# Patient Record
Sex: Female | Born: 1950
Health system: Southern US, Community
[De-identification: ages and names within clinical notes are randomized; demographics above are authoritative.]

## PROBLEM LIST (undated history)

## (undated) DIAGNOSIS — G709 Myoneural disorder, unspecified: Secondary | ICD-10-CM

## (undated) DIAGNOSIS — N189 Chronic kidney disease, unspecified: Secondary | ICD-10-CM

## (undated) DIAGNOSIS — M5136 Other intervertebral disc degeneration, lumbar region: Secondary | ICD-10-CM

## (undated) DIAGNOSIS — T8859XA Other complications of anesthesia, initial encounter: Secondary | ICD-10-CM

## (undated) DIAGNOSIS — E785 Hyperlipidemia, unspecified: Secondary | ICD-10-CM

## (undated) DIAGNOSIS — Z789 Other specified health status: Secondary | ICD-10-CM

## (undated) DIAGNOSIS — M51369 Other intervertebral disc degeneration, lumbar region without mention of lumbar back pain or lower extremity pain: Secondary | ICD-10-CM

## (undated) DIAGNOSIS — T4145XA Adverse effect of unspecified anesthetic, initial encounter: Secondary | ICD-10-CM

## (undated) HISTORY — DX: Other intervertebral disc degeneration, lumbar region without mention of lumbar back pain or lower extremity pain: M51.369

## (undated) HISTORY — DX: Myoneural disorder, unspecified: G70.9

## (undated) HISTORY — DX: Hyperlipidemia, unspecified: E78.5

## (undated) HISTORY — DX: Chronic kidney disease, unspecified: N18.9

## (undated) HISTORY — DX: Other intervertebral disc degeneration, lumbar region: M51.36

---

## 1981-09-22 HISTORY — PX: FASCIAL DEFECT REPAIR: SHX865

## 1983-09-23 HISTORY — PX: NASAL SEPTUM SURGERY: SHX37

## 1998-05-01 ENCOUNTER — Ambulatory Visit (HOSPITAL_COMMUNITY): Admission: RE | Admit: 1998-05-01 | Discharge: 1998-05-01 | Payer: Self-pay | Admitting: Family Medicine

## 1999-05-07 ENCOUNTER — Encounter: Payer: Self-pay | Admitting: Family Medicine

## 1999-05-07 ENCOUNTER — Ambulatory Visit (HOSPITAL_COMMUNITY): Admission: RE | Admit: 1999-05-07 | Discharge: 1999-05-07 | Payer: Self-pay | Admitting: Family Medicine

## 1999-10-30 ENCOUNTER — Other Ambulatory Visit: Admission: RE | Admit: 1999-10-30 | Discharge: 1999-10-30 | Payer: Self-pay | Admitting: Family Medicine

## 2000-05-20 ENCOUNTER — Ambulatory Visit (HOSPITAL_COMMUNITY): Admission: RE | Admit: 2000-05-20 | Discharge: 2000-05-20 | Payer: Self-pay | Admitting: Family Medicine

## 2000-05-20 ENCOUNTER — Encounter: Payer: Self-pay | Admitting: Family Medicine

## 2000-11-09 ENCOUNTER — Other Ambulatory Visit: Admission: RE | Admit: 2000-11-09 | Discharge: 2000-11-09 | Payer: Self-pay | Admitting: Family Medicine

## 2001-05-28 ENCOUNTER — Encounter: Payer: Self-pay | Admitting: Family Medicine

## 2001-05-28 ENCOUNTER — Ambulatory Visit (HOSPITAL_COMMUNITY): Admission: RE | Admit: 2001-05-28 | Discharge: 2001-05-28 | Payer: Self-pay | Admitting: Family Medicine

## 2002-01-10 ENCOUNTER — Other Ambulatory Visit: Admission: RE | Admit: 2002-01-10 | Discharge: 2002-01-10 | Payer: Self-pay | Admitting: Family Medicine

## 2002-01-14 ENCOUNTER — Encounter: Payer: Self-pay | Admitting: Family Medicine

## 2002-01-14 ENCOUNTER — Encounter: Admission: RE | Admit: 2002-01-14 | Discharge: 2002-01-14 | Payer: Self-pay | Admitting: Family Medicine

## 2002-06-20 ENCOUNTER — Encounter: Payer: Self-pay | Admitting: Family Medicine

## 2002-06-20 ENCOUNTER — Ambulatory Visit (HOSPITAL_COMMUNITY): Admission: RE | Admit: 2002-06-20 | Discharge: 2002-06-20 | Payer: Self-pay | Admitting: Family Medicine

## 2003-03-03 ENCOUNTER — Other Ambulatory Visit: Admission: RE | Admit: 2003-03-03 | Discharge: 2003-03-03 | Payer: Self-pay | Admitting: Family Medicine

## 2003-07-19 ENCOUNTER — Ambulatory Visit (HOSPITAL_COMMUNITY): Admission: RE | Admit: 2003-07-19 | Discharge: 2003-07-19 | Payer: Self-pay | Admitting: Family Medicine

## 2004-03-06 ENCOUNTER — Other Ambulatory Visit: Admission: RE | Admit: 2004-03-06 | Discharge: 2004-03-06 | Payer: Self-pay | Admitting: Family Medicine

## 2004-08-01 ENCOUNTER — Ambulatory Visit (HOSPITAL_COMMUNITY): Admission: RE | Admit: 2004-08-01 | Discharge: 2004-08-01 | Payer: Self-pay | Admitting: Family Medicine

## 2005-04-04 ENCOUNTER — Ambulatory Visit: Payer: Self-pay | Admitting: Family Medicine

## 2005-06-23 ENCOUNTER — Other Ambulatory Visit: Admission: RE | Admit: 2005-06-23 | Discharge: 2005-06-23 | Payer: Self-pay | Admitting: Family Medicine

## 2005-06-23 ENCOUNTER — Ambulatory Visit: Payer: Self-pay | Admitting: Family Medicine

## 2005-07-04 ENCOUNTER — Ambulatory Visit: Payer: Self-pay | Admitting: Family Medicine

## 2005-08-11 ENCOUNTER — Ambulatory Visit (HOSPITAL_COMMUNITY): Admission: RE | Admit: 2005-08-11 | Discharge: 2005-08-11 | Payer: Self-pay | Admitting: Family Medicine

## 2006-05-18 ENCOUNTER — Ambulatory Visit: Payer: Self-pay | Admitting: Family Medicine

## 2006-06-11 ENCOUNTER — Ambulatory Visit: Payer: Self-pay | Admitting: Family Medicine

## 2006-07-03 ENCOUNTER — Encounter: Payer: Self-pay | Admitting: Family Medicine

## 2006-07-03 ENCOUNTER — Ambulatory Visit: Payer: Self-pay | Admitting: Family Medicine

## 2006-07-03 ENCOUNTER — Other Ambulatory Visit: Admission: RE | Admit: 2006-07-03 | Discharge: 2006-07-03 | Payer: Self-pay | Admitting: Family Medicine

## 2006-08-31 ENCOUNTER — Ambulatory Visit (HOSPITAL_COMMUNITY): Admission: RE | Admit: 2006-08-31 | Discharge: 2006-08-31 | Payer: Self-pay | Admitting: Family Medicine

## 2006-09-22 HISTORY — PX: COLONOSCOPY: SHX174

## 2007-07-01 ENCOUNTER — Ambulatory Visit: Payer: Self-pay | Admitting: Family Medicine

## 2007-07-01 LAB — CONVERTED CEMR LAB
Alkaline Phosphatase: 43 units/L (ref 39–117)
BUN: 16 mg/dL (ref 6–23)
Basophils Relative: 0.4 % (ref 0.0–1.0)
Bilirubin Urine: NEGATIVE
Bilirubin, Direct: 0.1 mg/dL (ref 0.0–0.3)
CO2: 31 meq/L (ref 19–32)
Cholesterol: 265 mg/dL (ref 0–200)
GFR calc Af Amer: 66 mL/min
Glucose, Bld: 92 mg/dL (ref 70–99)
Glucose, Urine, Semiquant: NEGATIVE
Hemoglobin: 14.3 g/dL (ref 12.0–15.0)
Lymphocytes Relative: 38.1 % (ref 12.0–46.0)
MCV: 92.6 fL (ref 78.0–100.0)
Monocytes Absolute: 0.4 10*3/uL (ref 0.2–0.7)
Monocytes Relative: 7.9 % (ref 3.0–11.0)
Neutro Abs: 2.4 10*3/uL (ref 1.4–7.7)
Potassium: 4.7 meq/L (ref 3.5–5.1)
Specific Gravity, Urine: 1.015
TSH: 2.66 microintl units/mL (ref 0.35–5.50)
Total Protein: 6.7 g/dL (ref 6.0–8.3)
VLDL: 9 mg/dL (ref 0–40)
pH: 7

## 2007-07-08 ENCOUNTER — Encounter: Payer: Self-pay | Admitting: Family Medicine

## 2007-07-08 ENCOUNTER — Other Ambulatory Visit: Admission: RE | Admit: 2007-07-08 | Discharge: 2007-07-08 | Payer: Self-pay | Admitting: Family Medicine

## 2007-07-08 ENCOUNTER — Ambulatory Visit: Payer: Self-pay | Admitting: Family Medicine

## 2007-07-08 DIAGNOSIS — E785 Hyperlipidemia, unspecified: Secondary | ICD-10-CM

## 2007-07-08 DIAGNOSIS — M818 Other osteoporosis without current pathological fracture: Secondary | ICD-10-CM | POA: Insufficient documentation

## 2007-07-16 ENCOUNTER — Ambulatory Visit: Payer: Self-pay | Admitting: Gastroenterology

## 2007-07-30 ENCOUNTER — Ambulatory Visit: Payer: Self-pay | Admitting: Gastroenterology

## 2007-07-30 ENCOUNTER — Encounter: Payer: Self-pay | Admitting: Gastroenterology

## 2007-07-30 ENCOUNTER — Encounter: Payer: Self-pay | Admitting: Family Medicine

## 2007-09-02 ENCOUNTER — Ambulatory Visit (HOSPITAL_COMMUNITY): Admission: RE | Admit: 2007-09-02 | Discharge: 2007-09-02 | Payer: Self-pay | Admitting: Family Medicine

## 2007-09-09 ENCOUNTER — Ambulatory Visit: Payer: Self-pay | Admitting: Internal Medicine

## 2008-09-21 ENCOUNTER — Ambulatory Visit: Payer: Self-pay | Admitting: Family Medicine

## 2008-09-21 LAB — CONVERTED CEMR LAB
Albumin: 4.4 g/dL (ref 3.5–5.2)
Alkaline Phosphatase: 42 units/L (ref 39–117)
BUN: 18 mg/dL (ref 6–23)
Bilirubin Urine: NEGATIVE
Calcium: 9.9 mg/dL (ref 8.4–10.5)
Cholesterol: 294 mg/dL (ref 0–200)
Eosinophils Absolute: 0.1 10*3/uL (ref 0.0–0.7)
Eosinophils Relative: 2.1 % (ref 0.0–5.0)
GFR calc Af Amer: 59 mL/min
GFR calc non Af Amer: 49 mL/min
HCT: 43.6 % (ref 36.0–46.0)
Ketones, urine, test strip: NEGATIVE
MCV: 96.4 fL (ref 78.0–100.0)
Monocytes Absolute: 0.3 10*3/uL (ref 0.1–1.0)
Platelets: 213 10*3/uL (ref 150–400)
Potassium: 4.9 meq/L (ref 3.5–5.1)
RDW: 12.2 % (ref 11.5–14.6)
Sodium: 147 meq/L — ABNORMAL HIGH (ref 135–145)
Specific Gravity, Urine: 1.015
TSH: 2.73 microintl units/mL (ref 0.35–5.50)
Triglycerides: 103 mg/dL (ref 0–149)
pH: 7.5

## 2008-09-28 ENCOUNTER — Ambulatory Visit (HOSPITAL_COMMUNITY): Admission: RE | Admit: 2008-09-28 | Discharge: 2008-09-28 | Payer: Self-pay | Admitting: Family Medicine

## 2008-09-29 ENCOUNTER — Encounter: Payer: Self-pay | Admitting: Family Medicine

## 2008-09-29 ENCOUNTER — Other Ambulatory Visit: Admission: RE | Admit: 2008-09-29 | Discharge: 2008-09-29 | Payer: Self-pay | Admitting: Family Medicine

## 2008-09-29 ENCOUNTER — Ambulatory Visit: Payer: Self-pay | Admitting: Family Medicine

## 2009-10-05 ENCOUNTER — Ambulatory Visit: Payer: Self-pay | Admitting: Family Medicine

## 2009-10-05 LAB — CONVERTED CEMR LAB
ALT: 15 units/L (ref 0–35)
ALT: 25 units/L (ref 0–35)
AST: 21 units/L (ref 0–37)
AST: 32 units/L (ref 0–37)
Albumin: 4.6 g/dL (ref 3.5–5.2)
Albumin: 4.9 g/dL (ref 3.5–5.2)
Alkaline Phosphatase: 32 units/L — ABNORMAL LOW (ref 39–117)
Alkaline Phosphatase: 57 units/L (ref 39–117)
Basophils Absolute: 0 10*3/uL (ref 0.0–0.1)
Bilirubin Urine: NEGATIVE
CO2: 19 meq/L (ref 19–32)
Calcium: 10.2 mg/dL (ref 8.4–10.5)
Calcium: 9 mg/dL (ref 8.4–10.5)
Cholesterol: 129 mg/dL (ref 0–200)
Cholesterol: 316 mg/dL — ABNORMAL HIGH (ref 0–200)
Creatinine, Ser: 0.98 mg/dL (ref 0.40–1.20)
Eosinophils Relative: 1.5 % (ref 0.0–5.0)
Glucose, Urine, Semiquant: NEGATIVE
HCT: 45.5 % (ref 36.0–46.0)
HDL: 123 mg/dL (ref >39)
HDL: 62 mg/dL (ref >39)
Lymphs Abs: 1.2 10*3/uL (ref 0.7–4.0)
MCV: 97.4 fL (ref 78.0–100.0)
Monocytes Absolute: 0.3 10*3/uL (ref 0.1–1.0)
Neutrophils Relative %: 68.7 % (ref 43.0–77.0)
Platelets: 221 10*3/uL (ref 150.0–400.0)
RDW: 12.8 % (ref 11.5–14.6)
Sodium: 141 meq/L (ref 135–145)
Sodium: 143 meq/L (ref 135–145)
Specific Gravity, Urine: 1.015
TSH: 1.45 microintl units/mL (ref 0.35–5.50)
Total CHOL/HDL Ratio: 2.1
Total CHOL/HDL Ratio: 2.6
Total Protein: 7 g/dL (ref 6.0–8.3)
Triglycerides: 43 mg/dL (ref <150)
WBC: 5.1 10*3/uL (ref 4.5–10.5)
pH: 6.5

## 2009-10-16 ENCOUNTER — Other Ambulatory Visit: Admission: RE | Admit: 2009-10-16 | Discharge: 2009-10-16 | Payer: Self-pay | Admitting: Family Medicine

## 2009-10-16 ENCOUNTER — Ambulatory Visit: Payer: Self-pay | Admitting: Family Medicine

## 2009-10-16 LAB — CONVERTED CEMR LAB: Pap Smear: NEGATIVE

## 2009-10-19 LAB — CONVERTED CEMR LAB
ALT: 25 units/L (ref 0–35)
BUN: 13 mg/dL (ref 6–23)
Basophils Absolute: 0 10*3/uL (ref 0.0–0.1)
Bilirubin, Direct: 0.1 mg/dL (ref 0.0–0.3)
CO2: 30 meq/L (ref 19–32)
Chloride: 103 meq/L (ref 96–112)
Cholesterol: 255 mg/dL — ABNORMAL HIGH (ref 0–200)
Creatinine, Ser: 1.1 mg/dL (ref 0.4–1.2)
Direct LDL: 130.7 mg/dL
Eosinophils Absolute: 0.1 10*3/uL (ref 0.0–0.7)
Eosinophils Relative: 1.4 % (ref 0.0–5.0)
Glucose, Bld: 84 mg/dL (ref 70–99)
HCT: 39.8 % (ref 36.0–46.0)
Lymphs Abs: 1.4 10*3/uL (ref 0.7–4.0)
MCHC: 33.4 g/dL (ref 30.0–36.0)
MCV: 96.3 fL (ref 78.0–100.0)
Monocytes Absolute: 0.2 10*3/uL (ref 0.1–1.0)
Neutrophils Relative %: 64 % (ref 43.0–77.0)
Platelets: 206 10*3/uL (ref 150.0–400.0)
Potassium: 3.9 meq/L (ref 3.5–5.1)
RDW: 12.3 % (ref 11.5–14.6)
TSH: 1.57 microintl units/mL (ref 0.35–5.50)
Total Bilirubin: 0.9 mg/dL (ref 0.3–1.2)
VLDL: 14.8 mg/dL (ref 0.0–40.0)

## 2009-11-20 ENCOUNTER — Ambulatory Visit (HOSPITAL_COMMUNITY): Admission: RE | Admit: 2009-11-20 | Discharge: 2009-11-20 | Payer: Self-pay | Admitting: Family Medicine

## 2010-10-24 NOTE — Assessment & Plan Note (Signed)
Summary: cpx/cjr   Vital Signs:  Patient profile:   60 year old female Menstrual status:  postmenopausal Height:      60 inches Weight:      105 pounds BMI:     20.58 Temp:     97.3 degrees F oral BP sitting:   124 / 78  (left arm) Cuff size:   regular  Vitals Entered By: Kern Reap CMA Duncan Dull) (October 16, 2009 1:48 PM)  Reason for Visit cpx  History of Present Illness: william is a 60 year old, single female, nonsmoker, who comes in today for physical examination,  She's had a history of some osteoporosis.  She was on Fosamax, but withheld the because of the negative issues with Fosamax.  She takes calcium, vitamin D, and walks on a regular basis.  She was using Premarin vaginal cream for vaginal dryness stopped it because it costs.  She has a history of hyperlipidemia, but she treats with diet and exercise.  She gets routine eye care, be 6 months, where her dental care, does BSE monthly.  Does not want to get a mammogram because the $250 charge.  Advised to get a mammogram because of her history of fibrocystic disease.  It makes it extremely difficult to, tell what's going on in her breasts.  She colonoscopy 2009 which showed some polyps.  She is on a recall list for 5 years.  Tetanus 2010 seasonal flu shot today  Allergies: No Known Drug Allergies  Past History:  Past medical, surgical, family and social histories (including risk factors) reviewed, and no changes noted (except as noted below).  Past Medical History: Reviewed history from 04/08/2007 and no changes required. OP Elevated Cholesterol  Past Surgical History: Reviewed history from 09/29/2008 and no changes required. Denies surgical history  Family History: Reviewed history from 09/29/2008 and no changes required. mother and father still living.  No chronic problems  Social History: Reviewed history from 07/08/2007 and no changes required. Single Domestic Partner Never Smoked Alcohol  use-yes Drug use-no Regular exercise-yes  Review of Systems      See HPI       Flu Vaccine Consent Questions     Do you have a history of severe allergic reactions to this vaccine? no    Any prior history of allergic reactions to egg and/or gelatin? no    Do you have a sensitivity to the preservative Thimersol? no    Do you have a past history of Guillan-Barre Syndrome? no    Do you currently have an acute febrile illness? no    Have you ever had a severe reaction to latex? no    Vaccine information given and explained to patient? yes    Are you currently pregnant? no    Lot Number:[id number]   Exp Date:03/21/2010   Site Given  right  Deltoid IM   Physical Exam  General:  Well-developed,well-nourished,in no acute distress; alert,appropriate and cooperative throughout examination Head:  Normocephalic and atraumatic without obvious abnormalities. No apparent alopecia or balding. Eyes:  No corneal or conjunctival inflammation noted. EOMI. Perrla. Funduscopic exam benign, without hemorrhages, exudates or papilledema. Vision grossly normal. Ears:  External ear exam shows no significant lesions or deformities.  Otoscopic examination reveals clear canals, tympanic membranes are intact bilaterally without bulging, retraction, inflammation or discharge. Hearing is grossly normal bilaterally. Nose:  External nasal examination shows no deformity or inflammation. Nasal mucosa are pink and moist without lesions or exudates. Mouth:  Oral mucosa and oropharynx without lesions or  exudates.  Teeth in good repair. Neck:  No deformities, masses, or tenderness noted. Chest Wall:  No deformities, masses, or tenderness noted. Breasts:  the breasts are symmetrical.  Diffuse fibrocystic changes throughout breasts breasts, more prominent on the left than the right. Lungs:  Normal respiratory effort, chest expands symmetrically. Lungs are clear to auscultation, no crackles or wheezes. Heart:  Normal rate and  regular rhythm. S1 and S2 normal without gallop, murmur, click, rub or other extra sounds. Abdomen:  Bowel sounds positive,abdomen soft and non-tender without masses, organomegaly or hernias noted. Rectal:  No external abnormalities noted. Normal sphincter tone. No rectal masses or tenderness. Genitalia:  Pelvic Exam:        External: normal female genitalia without lesions or masses        Vagina: normal without lesions or masses        Cervix: normal without lesions or masses        Adnexa: normal bimanual exam without masses or fullness        Uterus: normal by palpation        Pap smear: performed Msk:  No deformity or scoliosis noted of thoracic or lumbar spine.   Pulses:  R and L carotid,radial,femoral,dorsalis pedis and posterior tibial pulses are full and equal bilaterally Extremities:  No clubbing, cyanosis, edema, or deformity noted with normal full range of motion of all joints.   Neurologic:  No cranial nerve deficits noted. Station and gait are normal. Plantar reflexes are down-going bilaterally. DTRs are symmetrical throughout. Sensory, motor and coordinative functions appear intact. Skin:  Intact without suspicious lesions or rashes Cervical Nodes:  No lymphadenopathy noted Axillary Nodes:  No palpable lymphadenopathy Inguinal Nodes:  No significant adenopathy Psych:  Cognition and judgment appear intact. Alert and cooperative with normal attention span and concentration. No apparent delusions, illusions, hallucinations   Impression & Recommendations:  Problem # 1:  HYPERLIPIDEMIA NEC/NOS (ICD-272.4) Assessment Unchanged  Orders: TLB-Lipid Panel (80061-LIPID) TLB-BMP (Basic Metabolic Panel-BMET) (80048-METABOL) TLB-CBC Platelet - w/Differential (85025-CBCD) TLB-Hepatic/Liver Function Pnl (80076-HEPATIC) TLB-TSH (Thyroid Stimulating Hormone) (84443-TSH) No Charge Patient Arrived (NCPA0) (NCPA0) Prescription Created Electronically [phone])  Problem # 2:  OSTEOPOROSIS,  IDIOPATHIC (ICD-733.02) Assessment: Unchanged  The following medications were removed from the medication list:    Fosamax 70 Mg Tabs (Alendronate sodium) .Marland Kitchen... 1/2 tab week  Orders: TLB-Lipid Panel (80061-LIPID) TLB-BMP (Basic Metabolic Panel-BMET) (80048-METABOL) TLB-CBC Platelet - w/Differential (85025-CBCD) TLB-Hepatic/Liver Function Pnl (80076-HEPATIC) TLB-TSH (Thyroid Stimulating Hormone) (84443-TSH) No Charge Patient Arrived (NCPA0) (NCPA0) Prescription Created Electronically [phone])  Problem # 3:  HEALTH SCREENING (ICD-V70.0) Assessment: Unchanged  Orders: TLB-Lipid Panel (80061-LIPID) TLB-BMP (Basic Metabolic Panel-BMET) (80048-METABOL) TLB-CBC Platelet - w/Differential (85025-CBCD) TLB-Hepatic/Liver Function Pnl (80076-HEPATIC) TLB-TSH (Thyroid Stimulating Hormone) (84443-TSH) No Charge Patient Arrived (NCPA0) (NCPA0) Prescription Created Electronically [id number])  Complete Medication List: 1)  Omega 3 340 Mg Cpdr (Omega-3 fatty acids) .... Take one tab once daily 2)  Daily Multi Tabs (Multiple vitamins-minerals) .... Once daily 3)  Caltrate 600+d Plus 600-400 Mg-unit Tabs (Calcium carbonate-vit d-min) .... Once daily 4)  Aspirin 81 Mg Tbec (Aspirin) .... Once daily  Other Orders: Admin 1st Vaccine (08657) Flu Vaccine 22yrs + [phone])  Patient Instructions: 1)  I will call you when I get the lab issue resolved. 2)  Please schedule a follow-up appointment in 1 year. 3)  Schedule your mammogram. 4)  Take calcium +Vitamin D daily. 5)  Take an Aspirin every day.

## 2013-10-25 ENCOUNTER — Telehealth: Payer: Self-pay | Admitting: Family Medicine

## 2013-10-25 NOTE — Telephone Encounter (Signed)
Pt would like to re-est w/md. Kerin SalenGolf buddy

## 2013-10-26 NOTE — Telephone Encounter (Signed)
Okay per Dr Todd 

## 2013-10-27 NOTE — Telephone Encounter (Signed)
Pt has been sch

## 2013-12-20 ENCOUNTER — Ambulatory Visit: Payer: Self-pay | Admitting: Family Medicine

## 2013-12-26 ENCOUNTER — Ambulatory Visit (INDEPENDENT_AMBULATORY_CARE_PROVIDER_SITE_OTHER): Payer: Medicare Other | Admitting: Family Medicine

## 2013-12-26 ENCOUNTER — Other Ambulatory Visit: Payer: Self-pay

## 2013-12-26 ENCOUNTER — Other Ambulatory Visit (HOSPITAL_COMMUNITY)
Admission: RE | Admit: 2013-12-26 | Discharge: 2013-12-26 | Disposition: A | Discharge status: Home / Self Care | Payer: Medicare Other | Source: Ambulatory Visit | Attending: Family Medicine | Admitting: Family Medicine

## 2013-12-26 ENCOUNTER — Encounter: Payer: Self-pay | Admitting: Family Medicine

## 2013-12-26 VITALS — BP 120/80 | Temp 97.7°F | Ht 60.0 in | Wt 102.0 lb

## 2013-12-26 DIAGNOSIS — Z124 Encounter for screening for malignant neoplasm of cervix: Secondary | ICD-10-CM | POA: Diagnosis not present

## 2013-12-26 DIAGNOSIS — Z0000 Encounter for general adult medical examination without abnormal findings: Secondary | ICD-10-CM | POA: Diagnosis not present

## 2013-12-26 DIAGNOSIS — Z23 Encounter for immunization: Secondary | ICD-10-CM | POA: Diagnosis not present

## 2013-12-26 DIAGNOSIS — Z1231 Encounter for screening mammogram for malignant neoplasm of breast: Secondary | ICD-10-CM

## 2013-12-26 LAB — POCT URINALYSIS DIPSTICK
Bilirubin, UA: NEGATIVE
Glucose, UA: NEGATIVE
Leukocytes, UA: NEGATIVE
Nitrite, UA: NEGATIVE
PH UA: 5.5
RBC UA: NEGATIVE
SPEC GRAV UA: 1.025
Urobilinogen, UA: 0.2

## 2013-12-26 LAB — CBC WITH DIFFERENTIAL/PLATELET
BASOS PCT: 0.4 % (ref 0.0–3.0)
Basophils Absolute: 0 10*3/uL (ref 0.0–0.1)
EOS PCT: 1.3 % (ref 0.0–5.0)
Eosinophils Absolute: 0.1 10*3/uL (ref 0.0–0.7)
HEMATOCRIT: 42.3 % (ref 36.0–46.0)
Hemoglobin: 14.1 g/dL (ref 12.0–15.0)
LYMPHS ABS: 1.5 10*3/uL (ref 0.7–4.0)
Lymphocytes Relative: 30.6 % (ref 12.0–46.0)
MCHC: 33.3 g/dL (ref 30.0–36.0)
MCV: 96.7 fl (ref 78.0–100.0)
MONO ABS: 0.3 10*3/uL (ref 0.1–1.0)
Monocytes Relative: 6.6 % (ref 3.0–12.0)
Neutro Abs: 3 10*3/uL (ref 1.4–7.7)
Neutrophils Relative %: 61.1 % (ref 43.0–77.0)
Platelets: 219 10*3/uL (ref 150.0–400.0)
RBC: 4.37 Mil/uL (ref 3.87–5.11)
RDW: 13.2 % (ref 11.5–14.6)
WBC: 5 10*3/uL (ref 4.5–10.5)

## 2013-12-26 LAB — BASIC METABOLIC PANEL
BUN: 17 mg/dL (ref 6–23)
CHLORIDE: 104 meq/L (ref 96–112)
CO2: 29 meq/L (ref 19–32)
CREATININE: 1.1 mg/dL (ref 0.4–1.2)
Calcium: 10 mg/dL (ref 8.4–10.5)
GFR: 55.27 mL/min — ABNORMAL LOW (ref >60.00)
GLUCOSE: 86 mg/dL (ref 70–99)
Potassium: 4.7 mEq/L (ref 3.5–5.1)
Sodium: 143 mEq/L (ref 135–145)

## 2013-12-26 LAB — TSH: TSH: 0.83 u[IU]/mL (ref 0.35–5.50)

## 2013-12-26 NOTE — Progress Notes (Signed)
   Subjective:    Patient ID: Jennifer Lutz, female    DOB: [birth year], 63 y.o.   MRN: [id number]  HPI Jennifer Lutz is a 63 year old single female G0 P0 nonsmoker who comes in today for a Medicare wellness examination  We last saw her 4 years ago and a physical exam at that point everything was well.  There's been no change in her medical history since that time  She's hospitalized once when she was 20 for abdominal pain they saw that the right kidney was also on the left of the take it out to the left alone it's been functioning well since that time. She had a rhinoplasty and a laceration repaired her left lateral thigh as an outpatient. We'll no medicine allergies. Does not smoke or drink occasional drink of alcohol and takes no prescription medicines  Partner review of systems she gets annual eye care, dental care, colonoscopy 2011 showed some polyps, LMP when she was 63 8 years ago. Last Pap 4 years ago, BSE monthly, last mammogram 2012  Father died at 65 after a fall mother still alive 65 a nursing home one brother in good health tetanus booster do  She gets routine eye care, dental care, BSE monthly, mammography as outlined colonoscopy as above  Cognitive function normal she exercises regularly and plays golf 4-5 days per week, home health safety reviewed no issues identified, no guns in the house, she does have a health care power of attorney and living will    Review of Systems  Constitutional: Negative.   HENT: Negative.   Eyes: Negative.   Respiratory: Negative.   Cardiovascular: Negative.   Gastrointestinal: Negative.   Genitourinary: Negative.   Musculoskeletal: Negative.   Neurological: Negative.   Psychiatric/Behavioral: Negative.        Objective:   Physical Exam  Constitutional: She appears well-developed and well-nourished.  HENT:  Head: Normocephalic and atraumatic.  Right Ear: External ear normal.  Left Ear: External ear normal.  Nose: Nose normal.    Mouth/Throat: Oropharynx is clear and moist.  Eyes: EOM are normal. Pupils are equal, round, and reactive to light.  Neck: Normal range of motion. Neck supple. No thyromegaly present.  Cardiovascular: Normal rate, regular rhythm, normal heart sounds and intact distal pulses.  Exam reveals no gallop and no friction rub.   No murmur heard. Pulmonary/Chest: Effort normal and breath sounds normal.  Abdominal: Soft. Bowel sounds are normal. She exhibits no distension and no mass. There is no tenderness. There is no rebound.  Genitourinary: Vagina normal and uterus normal. Guaiac negative stool. No vaginal discharge found.  Bilateral breast exam normal however she does have dense breast tissue. Recommend 3-D mammography  Musculoskeletal: Normal range of motion.  Lymphadenopathy:    She has no cervical adenopathy.  Neurological: She is alert. She has normal reflexes. No cranial nerve deficit. She exhibits normal muscle tone. Coordination normal.  Skin: Skin is warm and dry. No rash noted. No erythema. No pallor.  Total body skin exam normal  Psychiatric: She has a normal mood and affect. Her behavior is normal. Judgment and thought content normal.          Assessment & Plan:  Healthy female return every 3 years for Pap smear. Yearly for general Medicare wellness exam

## 2013-12-26 NOTE — Progress Notes (Signed)
Pre visit review using our clinic review tool, if applicable. No additional management support is needed unless otherwise documented below in the visit note. 

## 2013-12-26 NOTE — Patient Instructions (Signed)
Continue your good health habits  Return yearly for general physical examination

## 2014-01-27 ENCOUNTER — Ambulatory Visit
Admission: RE | Admit: 2014-01-27 | Discharge: 2014-01-27 | Disposition: A | Discharge status: Home / Self Care | Payer: Medicare Other | Source: Ambulatory Visit

## 2014-01-27 DIAGNOSIS — Z1231 Encounter for screening mammogram for malignant neoplasm of breast: Secondary | ICD-10-CM

## 2014-06-30 ENCOUNTER — Telehealth: Payer: Self-pay | Admitting: Family Medicine

## 2014-06-30 NOTE — Telephone Encounter (Signed)
Patient Information:  Caller Name: Edwyna ShellHart  Phone: [phone]  Patient: Jennifer Lutz, Hart  Gender: Female  DOB: 03-Oct-1950  Age: [age] Years  PCP: Kelle Dartingodd, [facility](Family Practice)  Office Follow Up:  Does the office need to follow up with this patient?: Yes  Instructions For The Office: Plan of care  RN Note:  Patient is calling regarding headache which has been persistent for 1 week since the recent loss of her mother who passed away on 06/23/14 after complications of a CVA.  Denies visual changes, numbness, or dizziness.  Describes as mild to moderate more on right side.  B/P 130/70.  Offered appointment patient declines.  Symptoms  Reason For Call & Symptoms: headache  Reviewed Health History In EMR: Yes  Reviewed Medications In EMR: Yes  Reviewed Allergies In EMR: Yes  Reviewed Surgeries / Procedures: Yes  Date of Onset of Symptoms: 06/23/2014  Guideline(s) Used:  Headache  Disposition Per Guideline:   See Today or Tomorrow in Office  Reason For Disposition Reached:   New headache and age > 6565  Advice Given:  N/A  Patient Refused Recommendation:  Patient Refused Care Advice  Patient states she is unable to come for visit.

## 2014-12-25 ENCOUNTER — Other Ambulatory Visit: Payer: Self-pay | Admitting: Family Medicine

## 2014-12-25 DIAGNOSIS — Z1231 Encounter for screening mammogram for malignant neoplasm of breast: Secondary | ICD-10-CM

## 2015-01-11 ENCOUNTER — Telehealth: Payer: Self-pay | Admitting: Family Medicine

## 2015-01-11 NOTE — Telephone Encounter (Signed)
Pt last cpx 12-2013. Pt would like to be work in for cpx before sept

## 2015-01-12 NOTE — Telephone Encounter (Signed)
Okay to work in per Dr Todd 

## 2015-01-12 NOTE — Telephone Encounter (Signed)
Pt has been sch

## 2015-01-29 ENCOUNTER — Ambulatory Visit (HOSPITAL_COMMUNITY)
Admission: RE | Admit: 2015-01-29 | Discharge: 2015-01-29 | Disposition: A | Discharge status: Home / Self Care | Payer: Medicare Other | Source: Ambulatory Visit | Attending: Family Medicine | Admitting: Family Medicine

## 2015-01-29 DIAGNOSIS — Z1231 Encounter for screening mammogram for malignant neoplasm of breast: Secondary | ICD-10-CM | POA: Diagnosis not present

## 2015-01-30 ENCOUNTER — Ambulatory Visit (INDEPENDENT_AMBULATORY_CARE_PROVIDER_SITE_OTHER): Payer: Medicare Other | Admitting: Family Medicine

## 2015-01-30 ENCOUNTER — Encounter: Payer: Self-pay | Admitting: Family Medicine

## 2015-01-30 VITALS — BP 120/80 | Temp 97.9°F | Ht 59.75 in | Wt 101.0 lb

## 2015-01-30 DIAGNOSIS — Z23 Encounter for immunization: Secondary | ICD-10-CM | POA: Diagnosis not present

## 2015-01-30 DIAGNOSIS — Z0000 Encounter for general adult medical examination without abnormal findings: Secondary | ICD-10-CM | POA: Diagnosis not present

## 2015-01-30 DIAGNOSIS — M818 Other osteoporosis without current pathological fracture: Secondary | ICD-10-CM

## 2015-01-30 DIAGNOSIS — E785 Hyperlipidemia, unspecified: Secondary | ICD-10-CM | POA: Diagnosis not present

## 2015-01-30 LAB — BASIC METABOLIC PANEL
BUN: 17 mg/dL (ref 6–23)
CALCIUM: 9.7 mg/dL (ref 8.4–10.5)
CO2: 29 mEq/L (ref 19–32)
Chloride: 103 mEq/L (ref 96–112)
Creatinine, Ser: 0.99 mg/dL (ref 0.40–1.20)
GFR: 59.6 mL/min — ABNORMAL LOW (ref >60.00)
GLUCOSE: 94 mg/dL (ref 70–99)
POTASSIUM: 4.3 meq/L (ref 3.5–5.1)
Sodium: 139 mEq/L (ref 135–145)

## 2015-01-30 LAB — CBC WITH DIFFERENTIAL/PLATELET
BASOS PCT: 0.5 % (ref 0.0–3.0)
Basophils Absolute: 0 10*3/uL (ref 0.0–0.1)
Eosinophils Absolute: 0.1 10*3/uL (ref 0.0–0.7)
Eosinophils Relative: 1.3 % (ref 0.0–5.0)
HCT: 40.1 % (ref 36.0–46.0)
Hemoglobin: 13.9 g/dL (ref 12.0–15.0)
LYMPHS PCT: 28.3 % (ref 12.0–46.0)
Lymphs Abs: 1.2 10*3/uL (ref 0.7–4.0)
MCHC: 34.7 g/dL (ref 30.0–36.0)
MCV: 92.9 fl (ref 78.0–100.0)
Monocytes Absolute: 0.2 10*3/uL (ref 0.1–1.0)
Monocytes Relative: 5.9 % (ref 3.0–12.0)
NEUTROS PCT: 64 % (ref 43.0–77.0)
Neutro Abs: 2.7 10*3/uL (ref 1.4–7.7)
Platelets: 209 10*3/uL (ref 150.0–400.0)
RBC: 4.32 Mil/uL (ref 3.87–5.11)
RDW: 13.3 % (ref 11.5–15.5)
WBC: 4.2 10*3/uL (ref 4.0–10.5)

## 2015-01-30 LAB — POCT URINALYSIS DIPSTICK
BILIRUBIN UA: NEGATIVE
Glucose, UA: NEGATIVE
KETONES UA: NEGATIVE
Leukocytes, UA: NEGATIVE
Nitrite, UA: NEGATIVE
PROTEIN UA: NEGATIVE
RBC UA: NEGATIVE
Spec Grav, UA: 1.02
Urobilinogen, UA: 0.2
pH, UA: 6

## 2015-01-30 LAB — TSH: TSH: 1.53 u[IU]/mL (ref 0.35–4.50)

## 2015-01-30 MED ORDER — ZOSTER VACCINE LIVE 19400 UNT/0.65ML ~~LOC~~ SOLR: 0.6500 mL | Freq: Once | SUBCUTANEOUS | Status: DC

## 2015-01-30 NOTE — Progress Notes (Signed)
Pre visit review using our clinic review tool, if applicable. No additional management support is needed unless otherwise documented below in the visit note. 

## 2015-01-30 NOTE — Progress Notes (Signed)
   Subjective:    Patient ID: Jennifer Lutz, female    DOB: 1951-02-25, 64 y.o.   MRN: [id number]  HPI  Jennifer Lutz is a 64 year old single female nonsmoker who comes in today for general physical examination  She's always been next health she has no chronic health problems and takes no medication on a regular basis for anything.  She gets routine eye care, dental care, BSE monthly, mammogram yesterday because of dense breast a 3-D was done which was normal, colonoscopy every 10 years normal last 1 2010.  She takes calcium vitamin D and aspirin tablet daily. She walks 18 holes of golf 3-4 days a week.  Cognitive function normal she walks daily home health safety reviewed no issues identified, no guns in the house, she does have a healthcare power of attorney and living well  She has a history of some low back pain. She describes as central L5-S1 area. Does not radiate is not sharp. She's a little stiff in the morning. She's tried some Motrin with some fairly good relief. Sitting makes it worse. No history of spinal trauma.  She has a history of some osteopenia we'll check bone density  Review of Systems  Constitutional: Negative.   HENT: Negative.   Eyes: Negative.   Respiratory: Negative.   Cardiovascular: Negative.   Gastrointestinal: Negative.   Endocrine: Negative.   Genitourinary: Negative.   Musculoskeletal: Negative.   Skin: Negative.   Allergic/Immunologic: Negative.   Neurological: Negative.   Hematological: Negative.   Psychiatric/Behavioral: Negative.        Objective:   Physical Exam  Constitutional: She appears well-developed and well-nourished.  HENT:  Head: Normocephalic and atraumatic.  Right Ear: External ear normal.  Left Ear: External ear normal.  Nose: Nose normal.  Mouth/Throat: Oropharynx is clear and moist.  Eyes: EOM are normal. Pupils are equal, round, and reactive to light.  Neck: Normal range of motion. Neck supple. No JVD present. No  tracheal deviation present. No thyromegaly present.  Cardiovascular: Normal rate, regular rhythm, normal heart sounds and intact distal pulses.  Exam reveals no gallop and no friction rub.   No murmur heard. Pulmonary/Chest: Effort normal and breath sounds normal. No stridor. No respiratory distress. She has no wheezes. She has no rales. She exhibits no tenderness.  Abdominal: Soft. Bowel sounds are normal. She exhibits no distension and no mass. There is no tenderness. There is no rebound and no guarding.  Genitourinary:  Bilateral breast exam normal  Pelvic and Pap deferred. Last year both were normal. No history of any GYN problems therefore every 3 years on her Paps  Musculoskeletal: Normal range of motion.  Lymphadenopathy:    She has no cervical adenopathy.  Neurological: She is alert. She has normal reflexes. No cranial nerve deficit. She exhibits normal muscle tone. Coordination normal.  Skin: Skin is warm and dry. No rash noted. No erythema. No pallor.  Total body skin exam normal  Psychiatric: She has a normal mood and affect. Her behavior is normal. Judgment and thought content normal.  Nursing note and vitals reviewed.         Assessment & Plan:  Healthy female  History of osteo-pia,,,,,,,,,, recheck bone density  Osteoarthritis,,,,,,,,,,,, spine, spine Motrin 400 twice a day and exercise. ,,,,,,,,,,,,,,,,,,,,,,,,,,

## 2015-01-30 NOTE — Patient Instructions (Signed)
Continue exercise and good health habits  Follow-up in one year sooner if any problems  Rachel's extension is [phone]  [employer] Energy[employer]ory nafzinger.........Marland Kitchen. our new adult nurse practitioner  Motrin 400 mg twice daily with food,,,,,,,, avoid sitting,,,,, continue exercise  Follow-up bone density

## 2015-02-02 ENCOUNTER — Ambulatory Visit (INDEPENDENT_AMBULATORY_CARE_PROVIDER_SITE_OTHER)
Admission: RE | Admit: 2015-02-02 | Discharge: 2015-02-02 | Disposition: A | Discharge status: Home / Self Care | Payer: Medicare Other | Source: Ambulatory Visit | Attending: Family Medicine | Admitting: Family Medicine

## 2015-02-02 DIAGNOSIS — M818 Other osteoporosis without current pathological fracture: Secondary | ICD-10-CM

## 2015-02-02 DIAGNOSIS — Z23 Encounter for immunization: Secondary | ICD-10-CM

## 2015-02-02 DIAGNOSIS — Z0000 Encounter for general adult medical examination without abnormal findings: Secondary | ICD-10-CM

## 2015-04-11 DIAGNOSIS — M24131 Other articular cartilage disorders, right wrist: Secondary | ICD-10-CM | POA: Diagnosis not present

## 2015-04-11 DIAGNOSIS — M24132 Other articular cartilage disorders, left wrist: Secondary | ICD-10-CM | POA: Diagnosis not present

## 2015-05-07 DIAGNOSIS — M24132 Other articular cartilage disorders, left wrist: Secondary | ICD-10-CM | POA: Diagnosis not present

## 2015-05-10 ENCOUNTER — Other Ambulatory Visit: Payer: Self-pay | Admitting: Orthopedic Surgery

## 2015-05-10 DIAGNOSIS — M19012 Primary osteoarthritis, left shoulder: Secondary | ICD-10-CM | POA: Diagnosis not present

## 2015-05-10 DIAGNOSIS — M25832 Other specified joint disorders, left wrist: Secondary | ICD-10-CM

## 2015-05-10 DIAGNOSIS — M25512 Pain in left shoulder: Secondary | ICD-10-CM | POA: Diagnosis not present

## 2015-05-10 DIAGNOSIS — M25532 Pain in left wrist: Secondary | ICD-10-CM

## 2015-05-21 ENCOUNTER — Ambulatory Visit
Admission: RE | Admit: 2015-05-21 | Discharge: 2015-05-21 | Disposition: A | Discharge status: Home / Self Care | Payer: Medicare Other | Source: Ambulatory Visit | Attending: Orthopedic Surgery | Admitting: Orthopedic Surgery

## 2015-05-21 DIAGNOSIS — M25532 Pain in left wrist: Secondary | ICD-10-CM

## 2015-05-21 DIAGNOSIS — M25832 Other specified joint disorders, left wrist: Secondary | ICD-10-CM

## 2015-05-21 DIAGNOSIS — S63592A Other specified sprain of left wrist, initial encounter: Secondary | ICD-10-CM | POA: Diagnosis not present

## 2015-05-21 MED ORDER — IOHEXOL 180 MG/ML  SOLN: 1.5000 mL | Freq: Once | INTRAMUSCULAR | Status: DC | PRN

## 2015-05-30 DIAGNOSIS — M24232 Disorder of ligament, left wrist: Secondary | ICD-10-CM | POA: Diagnosis not present

## 2015-05-31 ENCOUNTER — Encounter (HOSPITAL_BASED_OUTPATIENT_CLINIC_OR_DEPARTMENT_OTHER): Payer: Self-pay | Admitting: *Deleted

## 2015-05-31 ENCOUNTER — Other Ambulatory Visit: Payer: Self-pay | Admitting: Orthopedic Surgery

## 2015-06-07 ENCOUNTER — Ambulatory Visit (HOSPITAL_BASED_OUTPATIENT_CLINIC_OR_DEPARTMENT_OTHER): Payer: Medicare Other | Admitting: Anesthesiology

## 2015-06-07 ENCOUNTER — Encounter (HOSPITAL_BASED_OUTPATIENT_CLINIC_OR_DEPARTMENT_OTHER): Payer: Self-pay | Admitting: Anesthesiology

## 2015-06-07 ENCOUNTER — Ambulatory Visit (HOSPITAL_BASED_OUTPATIENT_CLINIC_OR_DEPARTMENT_OTHER)
Admission: RE | Admit: 2015-06-07 | Discharge: 2015-06-07 | Disposition: A | Discharge status: Home / Self Care | Payer: Medicare Other | Source: Ambulatory Visit | Attending: Orthopedic Surgery | Admitting: Orthopedic Surgery

## 2015-06-07 ENCOUNTER — Encounter (HOSPITAL_BASED_OUTPATIENT_CLINIC_OR_DEPARTMENT_OTHER)
Admission: RE | Disposition: A | Discharge status: Home / Self Care | Payer: Self-pay | Source: Ambulatory Visit | Attending: Orthopedic Surgery

## 2015-06-07 DIAGNOSIS — M24132 Other articular cartilage disorders, left wrist: Secondary | ICD-10-CM | POA: Diagnosis not present

## 2015-06-07 DIAGNOSIS — X58XXXA Exposure to other specified factors, initial encounter: Secondary | ICD-10-CM | POA: Diagnosis not present

## 2015-06-07 DIAGNOSIS — M24232 Disorder of ligament, left wrist: Secondary | ICD-10-CM | POA: Diagnosis not present

## 2015-06-07 DIAGNOSIS — S63512A Sprain of carpal joint of left wrist, initial encounter: Secondary | ICD-10-CM | POA: Diagnosis not present

## 2015-06-07 DIAGNOSIS — M25532 Pain in left wrist: Secondary | ICD-10-CM | POA: Diagnosis not present

## 2015-06-07 DIAGNOSIS — M24832 Other specific joint derangements of left wrist, not elsewhere classified: Secondary | ICD-10-CM | POA: Diagnosis not present

## 2015-06-07 DIAGNOSIS — G8918 Other acute postprocedural pain: Secondary | ICD-10-CM | POA: Diagnosis not present

## 2015-06-07 HISTORY — DX: Other specified health status: Z78.9

## 2015-06-07 HISTORY — PX: WRIST OSTEOTOMY: SHX1093

## 2015-06-07 HISTORY — PX: WRIST ARTHROSCOPY WITH ULNA SHORTENING: SHX5681

## 2015-06-07 HISTORY — DX: Adverse effect of unspecified anesthetic, initial encounter: T41.45XA

## 2015-06-07 HISTORY — DX: Other complications of anesthesia, initial encounter: T88.59XA

## 2015-06-07 SURGERY — WRIST ARTHROSCOPY WITH ULNA SHORTENING
Anesthesia: Regional | Site: Wrist | Laterality: Left

## 2015-06-07 MED ORDER — FENTANYL CITRATE (PF) 100 MCG/2ML IJ SOLN
INTRAMUSCULAR | Status: DC | PRN
  Administered 2015-06-07 (×2): 50 ug via INTRAVENOUS

## 2015-06-07 MED ORDER — PROPOFOL 10 MG/ML IV BOLUS
INTRAVENOUS | Status: AC
  Filled 2015-06-07: qty 20

## 2015-06-07 MED ORDER — ONDANSETRON HCL 4 MG/2ML IJ SOLN
INTRAMUSCULAR | Status: DC | PRN
  Administered 2015-06-07: 4 mg via INTRAVENOUS

## 2015-06-07 MED ORDER — MIDAZOLAM HCL 5 MG/5ML IJ SOLN
INTRAMUSCULAR | Status: DC | PRN
  Administered 2015-06-07: 2 mg via INTRAVENOUS

## 2015-06-07 MED ORDER — EPHEDRINE SULFATE 50 MG/ML IJ SOLN
INTRAMUSCULAR | Status: AC
  Filled 2015-06-07: qty 1

## 2015-06-07 MED ORDER — ATROPINE SULFATE 0.4 MG/ML IJ SOLN
INTRAMUSCULAR | Status: AC
  Filled 2015-06-07: qty 1

## 2015-06-07 MED ORDER — OXYCODONE-ACETAMINOPHEN 10-325 MG PO TABS: 1.0000 | ORAL_TABLET | ORAL | Status: DC | PRN

## 2015-06-07 MED ORDER — FENTANYL CITRATE (PF) 100 MCG/2ML IJ SOLN
INTRAMUSCULAR | Status: AC
  Filled 2015-06-07: qty 4

## 2015-06-07 MED ORDER — SUCCINYLCHOLINE CHLORIDE 20 MG/ML IJ SOLN
INTRAMUSCULAR | Status: AC
  Filled 2015-06-07: qty 1

## 2015-06-07 MED ORDER — MIDAZOLAM HCL 2 MG/2ML IJ SOLN
INTRAMUSCULAR | Status: AC
  Filled 2015-06-07: qty 2

## 2015-06-07 MED ORDER — BUPIVACAINE-EPINEPHRINE (PF) 0.5% -1:200000 IJ SOLN
INTRAMUSCULAR | Status: DC | PRN
Start: 2015-06-07 — End: 2015-06-07
  Administered 2015-06-07: 25 mL via PERINEURAL

## 2015-06-07 MED ORDER — ONDANSETRON HCL 4 MG/2ML IJ SOLN
INTRAMUSCULAR | Status: AC
  Filled 2015-06-07: qty 2

## 2015-06-07 MED ORDER — SCOPOLAMINE 1 MG/3DAYS TD PT72: 1.0000 | MEDICATED_PATCH | Freq: Once | TRANSDERMAL | Status: DC | PRN

## 2015-06-07 MED ORDER — FENTANYL CITRATE (PF) 100 MCG/2ML IJ SOLN
50.0000 ug | INTRAMUSCULAR | Status: DC | PRN
  Administered 2015-06-07: 50 ug via INTRAVENOUS

## 2015-06-07 MED ORDER — LACTATED RINGERS IV SOLN
INTRAVENOUS | Status: DC
  Administered 2015-06-07 (×2): via INTRAVENOUS

## 2015-06-07 MED ORDER — MIDAZOLAM HCL 2 MG/2ML IJ SOLN
INTRAMUSCULAR | Status: AC
  Filled 2015-06-07: qty 4

## 2015-06-07 MED ORDER — CEFAZOLIN SODIUM-DEXTROSE 2-3 GM-% IV SOLR: 2.0000 g | INTRAVENOUS | Status: DC

## 2015-06-07 MED ORDER — PHENYLEPHRINE HCL 10 MG/ML IJ SOLN
INTRAMUSCULAR | Status: AC
  Filled 2015-06-07: qty 1

## 2015-06-07 MED ORDER — GLYCOPYRROLATE 0.2 MG/ML IJ SOLN
INTRAMUSCULAR | Status: AC
  Filled 2015-06-07: qty 1

## 2015-06-07 MED ORDER — GLYCOPYRROLATE 0.2 MG/ML IJ SOLN: 0.2000 mg | Freq: Once | INTRAMUSCULAR | Status: DC | PRN

## 2015-06-07 MED ORDER — MIDAZOLAM HCL 2 MG/2ML IJ SOLN: 1.0000 mg | INTRAMUSCULAR | Status: DC | PRN

## 2015-06-07 MED ORDER — CEFAZOLIN SODIUM-DEXTROSE 2-3 GM-% IV SOLR
2.0000 g | INTRAVENOUS | Status: AC
  Administered 2015-06-07: 2 g via INTRAVENOUS

## 2015-06-07 MED ORDER — FENTANYL CITRATE (PF) 100 MCG/2ML IJ SOLN
INTRAMUSCULAR | Status: AC
  Filled 2015-06-07: qty 2

## 2015-06-07 MED ORDER — SODIUM CHLORIDE 0.9 % IR SOLN
Status: DC | PRN
  Administered 2015-06-07: 500 mL

## 2015-06-07 MED ORDER — CHLORHEXIDINE GLUCONATE 4 % EX LIQD: 60.0000 mL | Freq: Once | CUTANEOUS | Status: DC

## 2015-06-07 MED ORDER — PROPOFOL 10 MG/ML IV BOLUS
INTRAVENOUS | Status: DC | PRN
  Administered 2015-06-07 (×2): 50 mg via INTRAVENOUS
  Administered 2015-06-07: 30 mg via INTRAVENOUS
  Administered 2015-06-07: 100 mg via INTRAVENOUS

## 2015-06-07 MED ORDER — CEFAZOLIN SODIUM-DEXTROSE 2-3 GM-% IV SOLR
INTRAVENOUS | Status: AC
  Filled 2015-06-07: qty 50

## 2015-06-07 SURGICAL SUPPLY — 128 items
BAG DECANTER FOR FLEXI CONT (MISCELLANEOUS) ×2 IMPLANT
BANDAGE ELASTIC 3 VELCRO ST LF (GAUZE/BANDAGES/DRESSINGS) ×1 IMPLANT
BANDAGE ELASTIC 4 VELCRO ST LF (GAUZE/BANDAGES/DRESSINGS) ×1 IMPLANT
BIT DRILL 2.8X5 QR DISP (BIT) ×2 IMPLANT
BIT DRILL QUICK RELEASE 3.5MM (BIT) IMPLANT
BLADE AVERAGE 25MMX9MM (BLADE)
BLADE AVERAGE 25X9 (BLADE) ×1 IMPLANT
BLADE CUDA 2.0 (BLADE) IMPLANT
BLADE EAR TYMPAN 2.5 60D BEAV (BLADE) IMPLANT
BLADE MINI RND TIP GREEN BEAV (BLADE) IMPLANT
BLADE SAW OSTEOTOMY (BLADE) ×2 IMPLANT
BLADE SURG 15 STRL LF DISP TIS (BLADE) ×1 IMPLANT
BLADE SURG 15 STRL SS (BLADE) ×3
BNDG CMPR 9X4 STRL LF SNTH (GAUZE/BANDAGES/DRESSINGS) ×1
BNDG COHESIVE 3X5 TAN STRL LF (GAUZE/BANDAGES/DRESSINGS) ×7 IMPLANT
BNDG ESMARK 4X9 LF (GAUZE/BANDAGES/DRESSINGS) ×3 IMPLANT
BNDG GAUZE ELAST 4 BULKY (GAUZE/BANDAGES/DRESSINGS) ×3 IMPLANT
BONE TRINITY ELITE 1.2CC SM (Bone Implant) ×2 IMPLANT
BUR CUDA 2.9 (BURR) IMPLANT
BUR CUDA 2.9MM (BURR)
BUR EGG 3PK/BX (BURR) IMPLANT
BUR FULL RADIUS 2.0 (BURR) IMPLANT
BUR FULL RADIUS 2.0MM (BURR)
BUR FULL RADIUS 2.9 (BURR) ×1 IMPLANT
BUR FULL RADIUS 2.9MM (BURR) ×1
BUR GATOR 2.9 (BURR) IMPLANT
BUR GATOR 2.9MM (BURR)
BUR SPHERICAL 2.9 (BURR) IMPLANT
BUR SPHERICAL 2.9MM (BURR)
CANISTER SUCT 1200ML W/VALVE (MISCELLANEOUS) IMPLANT
CHLORAPREP W/TINT 26ML (MISCELLANEOUS) ×3 IMPLANT
CLOSURE WOUND 1/2 X4 (GAUZE/BANDAGES/DRESSINGS)
CLOSURE WOUND 1/4X4 (GAUZE/BANDAGES/DRESSINGS)
CORDS BIPOLAR (ELECTRODE) ×3 IMPLANT
COVER BACK TABLE 60X90IN (DRAPES) ×5 IMPLANT
COVER MAYO STAND STRL (DRAPES) ×3 IMPLANT
CUFF TOURNIQUET SINGLE 18IN (TOURNIQUET CUFF) ×3 IMPLANT
DECANTER SPIKE VIAL GLASS SM (MISCELLANEOUS) IMPLANT
DRAIN TLS ROUND 10FR (DRAIN) IMPLANT
DRAPE EXTREMITY T 121X128X90 (DRAPE) ×3 IMPLANT
DRAPE INCISE IOBAN 66X45 STRL (DRAPES) IMPLANT
DRAPE LAPAROTOMY 100X72 PEDS (DRAPES) IMPLANT
DRAPE OEC MINIVIEW 54X84 (DRAPES) ×3 IMPLANT
DRAPE SURG 17X23 STRL (DRAPES) ×3 IMPLANT
DRAPE U 20/CS (DRAPES) ×6 IMPLANT
DRILL QUICK RELEASE 3.5MM (BIT) ×3
DRSG KUZMA FLUFF (GAUZE/BANDAGES/DRESSINGS) IMPLANT
DRSG PAD ABDOMINAL 8X10 ST (GAUZE/BANDAGES/DRESSINGS) IMPLANT
ELECT REM PT RETURN 9FT ADLT (ELECTROSURGICAL)
ELECT SMALL JOINT 90D BASC (ELECTRODE) IMPLANT
ELECTRODE REM PT RTRN 9FT ADLT (ELECTROSURGICAL) IMPLANT
FACESHIELD WRAPAROUND (MASK) ×6 IMPLANT
FACESHIELD WRAPAROUND OR TEAM (MASK) IMPLANT
GAUZE SPONGE 4X4 12PLY STRL (GAUZE/BANDAGES/DRESSINGS) ×3 IMPLANT
GAUZE SPONGE 4X4 16PLY XRAY LF (GAUZE/BANDAGES/DRESSINGS) IMPLANT
GAUZE XEROFORM 1X8 LF (GAUZE/BANDAGES/DRESSINGS) ×4 IMPLANT
GLOVE BIOGEL PI IND STRL 7.0 (GLOVE) IMPLANT
GLOVE BIOGEL PI IND STRL 8.5 (GLOVE) ×1 IMPLANT
GLOVE BIOGEL PI INDICATOR 7.0 (GLOVE) ×4
GLOVE BIOGEL PI INDICATOR 8.5 (GLOVE) ×2
GLOVE ECLIPSE 6.5 STRL STRAW (GLOVE) ×2 IMPLANT
GLOVE SURG ORTHO 8.0 STRL STRW (GLOVE) ×3 IMPLANT
GOWN STRL REUS W/ TWL LRG LVL3 (GOWN DISPOSABLE) ×1 IMPLANT
GOWN STRL REUS W/TWL LRG LVL3 (GOWN DISPOSABLE) ×3
GOWN STRL REUS W/TWL XL LVL3 (GOWN DISPOSABLE) ×5 IMPLANT
GUIDEWIRE ORTHO 0.054X6 (WIRE) ×4 IMPLANT
IV SET EXTENSION GRAVITY 40 LF (IV SETS) ×3 IMPLANT
LOOP VESSEL MAXI BLUE (MISCELLANEOUS) IMPLANT
MANIFOLD NEPTUNE II (INSTRUMENTS) IMPLANT
NDL EPIDURAL TUOHY 20GX3.5 (NEEDLE) IMPLANT
NDL SAFETY ECLIPSE 18X1.5 (NEEDLE) ×1 IMPLANT
NDL SPNL 18GX3.5 QUINCKE PK (NEEDLE) IMPLANT
NEEDLE HYPO 18GX1.5 SHARP (NEEDLE) ×3
NEEDLE HYPO 22GX1.5 SAFETY (NEEDLE) ×3 IMPLANT
NEEDLE SPNL 18GX3.5 QUINCKE PK (NEEDLE) IMPLANT
NEEDLE TUOHY 20GX3.5 (NEEDLE) IMPLANT
NS IRRIG 1000ML POUR BTL (IV SOLUTION) ×3 IMPLANT
PACK BASIN DAY SURGERY FS (CUSTOM PROCEDURE TRAY) ×3 IMPLANT
PAD CAST 3X4 CTTN HI CHSV (CAST SUPPLIES) ×1 IMPLANT
PADDING CAST ABS 3INX4YD NS (CAST SUPPLIES)
PADDING CAST ABS 4INX4YD NS (CAST SUPPLIES) ×2
PADDING CAST ABS COTTON 3X4 (CAST SUPPLIES) ×1 IMPLANT
PADDING CAST ABS COTTON 4X4 ST (CAST SUPPLIES) ×1 IMPLANT
PADDING CAST COTTON 3X4 STRL (CAST SUPPLIES) ×6
PEN SKIN MARKING BROAD TIP (MISCELLANEOUS) ×2 IMPLANT
PENCIL BUTTON HOLSTER BLD 10FT (ELECTRODE) IMPLANT
PLATE ULNAR SHORTENING (Plate) ×2 IMPLANT
ROUTER HOODED VORTEX 2.9MM (BLADE) IMPLANT
SCREW ACTK 2 NL HEX 3.5.11 (Screw) ×4 IMPLANT
SCREW HEXALOBE NON-LOCK 3.5X16 (Screw) ×2 IMPLANT
SCREW NON LOCK 3.5X10MM (Screw) ×2 IMPLANT
SCREW NONLOCK HEX 3.5X12 (Screw) ×2 IMPLANT
SET ARTHROSCOPY TUBING (MISCELLANEOUS) ×3
SET ARTHROSCOPY TUBING LN (MISCELLANEOUS) IMPLANT
SET SM JOINT TUBING/CANN (CANNULA) IMPLANT
SLEEVE SCD COMPRESS KNEE MED (MISCELLANEOUS) ×3 IMPLANT
SPLINT PLASTER CAST XFAST 3X15 (CAST SUPPLIES) ×30 IMPLANT
SPLINT PLASTER XTRA FASTSET 3X (CAST SUPPLIES) ×60
SPONGE LAP 4X18 X RAY DECT (DISPOSABLE) IMPLANT
STOCKINETTE 4X48 STRL (DRAPES) ×3 IMPLANT
STRIP CLOSURE SKIN 1/2X4 (GAUZE/BANDAGES/DRESSINGS) IMPLANT
STRIP CLOSURE SKIN 1/4X4 (GAUZE/BANDAGES/DRESSINGS) IMPLANT
SUCTION FRAZIER TIP 10 FR DISP (SUCTIONS) IMPLANT
SUT CHROMIC 4 0 RB 1X27 (SUTURE) IMPLANT
SUT ETHIBOND 3-0 V-5 (SUTURE) IMPLANT
SUT ETHILON 4 0 PS 2 18 (SUTURE) ×3 IMPLANT
SUT ETHILON 5 0 P 3 18 (SUTURE)
SUT MERSILENE 4 0 P 3 (SUTURE) IMPLANT
SUT NOVA NAB GS-22 2 0 T19 (SUTURE) IMPLANT
SUT NYLON ETHILON 5-0 P-3 1X18 (SUTURE) IMPLANT
SUT PDS AB 2-0 CT2 27 (SUTURE) IMPLANT
SUT STEEL 4 0 (SUTURE) IMPLANT
SUT VIC AB 0 CT1 27 (SUTURE)
SUT VIC AB 0 CT1 27XBRD ANBCTR (SUTURE) IMPLANT
SUT VIC AB 2-0 PS2 27 (SUTURE) IMPLANT
SUT VIC AB 2-0 SH 27 (SUTURE)
SUT VIC AB 2-0 SH 27XBRD (SUTURE) IMPLANT
SUT VICRYL 4-0 PS2 18IN ABS (SUTURE) ×3 IMPLANT
SYR BULB 3OZ (MISCELLANEOUS) ×3 IMPLANT
SYR CONTROL 10ML LL (SYRINGE) ×3 IMPLANT
TOWEL OR 17X24 6PK STRL BLUE (TOWEL DISPOSABLE) ×5 IMPLANT
TRAP DIGIT (INSTRUMENTS) ×2 IMPLANT
TUBE CONNECTING 20'X1/4 (TUBING) ×1
TUBE CONNECTING 20X1/4 (TUBING) ×1 IMPLANT
UNDERPAD 30X30 (UNDERPADS AND DIAPERS) ×3 IMPLANT
WAND 1.5 MICROBLATOR (SURGICAL WAND) IMPLANT
WATER STERILE IRR 1000ML POUR (IV SOLUTION) ×3 IMPLANT
WIRE TACK PLATE PL-PTACK (WIRE) ×2 IMPLANT

## 2015-06-07 NOTE — Anesthesia Preprocedure Evaluation (Addendum)
Anesthesia Evaluation  Patient identified by MRN, date of birth, ID band Patient awake    Reviewed: Allergy & Precautions, NPO status , Patient's Chart, lab work & pertinent test results  Airway Mallampati: II  TM Distance: <3 FB Neck ROM: Full    Dental   Pulmonary neg pulmonary ROS,    breath sounds clear to auscultation       Cardiovascular negative cardio ROS   Rhythm:Regular Rate:Normal     Neuro/Psych negative neurological ROS     GI/Hepatic negative GI ROS, Neg liver ROS,   Endo/Other  negative endocrine ROS  Renal/GU negative Renal ROS     Musculoskeletal   Abdominal   Peds  Hematology negative hematology ROS (+)   Anesthesia Other Findings   Reproductive/Obstetrics                            Anesthesia Physical Anesthesia Plan  ASA: I  Anesthesia Plan: General and Regional   Post-op Pain Management: GA combined w/ Regional for post-op pain   Induction: Intravenous  Airway Management Planned: LMA  Additional Equipment:   Intra-op Plan:   Post-operative Plan:   Informed Consent: I have reviewed the patients History and Physical, chart, labs and discussed the procedure including the risks, benefits and alternatives for the proposed anesthesia with the patient or authorized representative who has indicated his/her understanding and acceptance.     Plan Discussed with: CRNA  Anesthesia Plan Comments:        Anesthesia Quick Evaluation

## 2015-06-07 NOTE — H&P (Signed)
Jennifer Lutz is a 64 year-old right-hand dominant female referred by Ala Bent from [employer].  She complains of stabbing on the wrist pain especially playing golf.  She is an avid Teacher, English as a foreign language.  She states when she dorsiflexes, pronates, supinates that when she gets her discomfort primarily on the right side, not so much on the left side. This has been going on for approximately six months, gradually increasing.  It is intermittent in nature.  She has tried taking Aleve and ice which has given her some relief. She has no discrete history of injury to the hand or to the neck, she has no history of diabetes, thyroid problems, arthritis or gout. She has had her MRI done revealing a very specific ulnocarpal abutment left wrist.   ALLERGIES:   None  MEDICATIONS:    None  SURGICAL HISTORY:  Rhinoplasty (1985)     FAMILY MEDICAL HISTORY:   Negative   SOCIAL HISTORY:    She does not smoke. She drinks socially.  She is divorced. Jennifer Lutz is an 64 y.o. female.   Chief Complaint: ulnar wrist pain left wrist HPI: see above  Past Medical History  Diagnosis Date  . Medical history non-contributory   . Complication of anesthesia     'takes very little.' 'Hard to wake up'    Past Surgical History  Procedure Laterality Date  . Nasal septum surgery  1985    Family History  Problem Relation Age of Onset  . Stroke Other    Social History:  reports that she has never smoked. She has never used smokeless tobacco. She reports that she drinks alcohol. She reports that she does not use illicit drugs.  Allergies: No Known Allergies  No prescriptions prior to admission    No results found for this or any previous visit (from the past 48 hour(s)).  No results found.   Pertinent items are noted in HPI.  Height 4' 11.75" (1.518 m), weight 46.72 kg (103 lb).  General appearance: alert, cooperative and appears stated age Head: Normocephalic, without obvious abnormality Neck: no JVD Resp:  clear to auscultation bilaterally Cardio: regular rate and rhythm, S1, S2 normal, no murmur, click, rub or gallop GI: soft, non-tender; bowel sounds normal; no masses,  no organomegaly Extremities: ulnar wrist pain Pulses: 2+ and symmetric Skin: Skin color, texture, turgor normal. No rashes or lesions Neurologic: Grossly normal Incision/Wound: na  Assessment/Plan RADIOGRAPHS:    X-rays reveal a millimeter too long ulnar bilaterally ulnocarpal abutment.    Diagnosis: ulnar carpal abutment left wrist  PLAN:  We have discussed this with her. She would like to proceed to have a shortening done. Pre, peri and post op care are discussed along with risks and complications. Patient is aware there is no guarantee with surgery, possibility of infection, injury to arteries, nerves, and tendons, incomplete relief and dystrophy.  We will plan on arthroscopy debridement in that she does show contrast agent in the mid carpal joint indicative of either a lunotriquetral or scapholunate tear.  The MRI reports possible scapholunate partial tear. This will be able to be visualized. We would not recommend doing anything to the scapholunate joint other than a possible shrinkage. She is advised that the ulnar shortening osteotomy will tighten the lunotriquetral joint by virtue of the shortening. Questions are encouraged and answered to the patient's satisfaction. She is scheduled for arthroscopy left wrist, ulnar shortening osteotomy open with Accumed plate and trinity bone grafting.  Corran Lalone R 06/07/2015, 10:33 AM

## 2015-06-07 NOTE — Brief Op Note (Signed)
06/07/2015  4:24 PM  PATIENT:  Jennifer Lutz  64 y.o. female  PRE-OPERATIVE DIAGNOSIS:  ULNOCARPAL ABUTMENT LEFT WRIST  POST-OPERATIVE DIAGNOSIS:  ULNOCARPAL ABUTMENT LEFT WRIST  PROCEDURE:  Procedure(s): ARTHROSCOPY LEFT WRIST DEBRIDEMENT ULNAR SHORTENING (Left) LEFT OSTEOTOMY (Left)  SURGEON:  Surgeon(s) and Role:    * Cindee Salt, MD - Primary    * Betha Loa, MD - Assisting  PHYSICIAN ASSISTANT:   ASSISTANTS: K Xiamara Hulet,MD   ANESTHESIA:   regional and general  EBL:  Total I/O In: 1700 [I.V.:1700] Out: -   BLOOD ADMINISTERED:none  DRAINS: none   LOCAL MEDICATIONS USED:  NONE  SPECIMEN:  No Specimen  DISPOSITION OF SPECIMEN:  N/A  COUNTS:  YES  TOURNIQUET:   Total Tourniquet Time Documented: Upper Arm (Left) - 61 minutes Total: Upper Arm (Left) - 61 minutes   DICTATION: .Other Dictation: Dictation Number [id number]  PLAN OF CARE: Discharge to home after PACU  PATIENT DISPOSITION:  PACU - hemodynamically stable.

## 2015-06-07 NOTE — Anesthesia Procedure Notes (Addendum)
Anesthesia Regional Block:  Axillary brachial plexus block  Pre-Anesthetic Checklist: ,, timeout performed, Correct Patient, Correct Site, Correct Laterality, Correct Procedure, Correct Position, site marked, Risks and benefits discussed,  Surgical consent,  Pre-op evaluation,  At surgeon's request and post-op pain management  Laterality: Left  Prep: chloraprep       Needles:   Needle Type: Echogenic Stimulator Needle     Needle Length: 9cm 9 cm Needle Gauge: 21 and 21 G    Additional Needles:  Procedures: ultrasound guided (picture in chart) and nerve stimulator Axillary brachial plexus block  Nerve Stimulator or Paresthesia:  Response: radial, 0.5 mA,  Response: median, 0.5 mA,  Response: ulnar, 0.5 mA,   Additional Responses:  Musculocutaneous 0.80mA Narrative:  Start time: 06/07/2015 1:27 PM End time: 06/07/2015 1:38 PM Injection made incrementally with aspirations every 5 mL.  Performed by: Personally  Anesthesiologist: Marcene Duos  Additional Notes: Risks and benefits discussed. Pt tolerated well with no immediate complications.   Procedure Name: LMA Insertion Date/Time: 06/07/2015 2:38 PM Performed by: Genevieve Norlander L Pre-anesthesia Checklist: Patient identified, Emergency Drugs available, Suction available and Patient being monitored Patient Re-evaluated:Patient Re-evaluated prior to inductionOxygen Delivery Method: Circle System Utilized Preoxygenation: Pre-oxygenation with 100% oxygen Intubation Type: IV induction Ventilation: Mask ventilation without difficulty LMA: LMA inserted LMA Size: 3.0 Number of attempts: 1 Airway Equipment and Method: Bite block Placement Confirmation: positive ETCO2 Tube secured with: Tape Dental Injury: Teeth and Oropharynx as per pre-operative assessment

## 2015-06-07 NOTE — Transfer of Care (Signed)
Immediate Anesthesia Transfer of Care Note  Patient: Jennifer Lutz  Procedure(s) Performed: Procedure(s): ARTHROSCOPY LEFT WRIST DEBRIDEMENT ULNAR SHORTENING (Left) LEFT OSTEOTOMY (Left)  Patient Location: PACU  Anesthesia Type:GA combined with regional for post-op pain  Level of Consciousness: awake and patient cooperative  Airway & Oxygen Therapy: Patient Spontanous Breathing and Patient connected to face mask oxygen  Post-op Assessment: Report given to RN and Post -op Vital signs reviewed and stable  Post vital signs: Reviewed and stable  Last Vitals:  Filed Vitals:   06/07/15 1622  BP: 129/72  Pulse: 71  Temp:   Resp:     Complications: No apparent anesthesia complications

## 2015-06-07 NOTE — Op Note (Signed)
Dictation Number [id number]

## 2015-06-07 NOTE — Anesthesia Postprocedure Evaluation (Signed)
  Anesthesia Post-op Note  Patient: Jennifer Lutz  Procedure(s) Performed: Procedure(s): ARTHROSCOPY LEFT WRIST DEBRIDEMENT ULNAR SHORTENING (Left) LEFT OSTEOTOMY (Left)  Patient Location: PACU  Anesthesia Type:GA combined with regional for post-op pain  Level of Consciousness: awake and alert   Airway and Oxygen Therapy: Patient Spontanous Breathing  Post-op Pain: none  Post-op Assessment: Post-op Vital signs reviewed              Post-op Vital Signs: Reviewed  Last Vitals:  Filed Vitals:   06/07/15 1645  BP: 140/74  Pulse: 66  Temp:   Resp: 18    Complications: No apparent anesthesia complications

## 2015-06-07 NOTE — Progress Notes (Signed)
Assisted Dr. Rob Fitzgerald with left, ultrasound guided, axillary block. Side rails up, monitors on throughout procedure. See vital signs in flow sheet. Tolerated Procedure well. 

## 2015-06-07 NOTE — Discharge Instructions (Signed)

## 2015-06-08 NOTE — Op Note (Signed)
Jennifer Lutz, Jennifer Lutz            ACCOUNT NO.:  [id number]  MEDICAL RECORD NO.:  [id number]  LOCATION:                                 FACILITY:  PHYSICIAN:  Cindee Salt, M.D.       DATE OF BIRTH:  [birth year]  DATE OF PROCEDURE:  06/07/2015 DATE OF DISCHARGE:                              OPERATIVE REPORT   PREOPERATIVE DIAGNOSIS:  Ulnocarpal abutment, left wrist.  POSTOPERATIVE DIAGNOSES:  Ulnocarpal abutment, left wrist plus scapholunate ligament tear.  OPERATION:  Arthroscopy with debridement of TFCC, left wrist with open ulnar shortening osteotomy with Acumed plate, left ulna.  SURGEON:  Cindee Salt, M.D.  ASSISTANT:  Betha Loa, MD  ANESTHESIA:  Supraclavicular block, general.  ANESTHESIOLOGIST:  Zenon Mayo, MD  HISTORY:  The patient is a 64 year old female with a history of ulnar- sided wrist pain.  MRI reveals a significant ulnocarpal abutment with significant changes on the ulnar aspect of lunate.  She has elected to undergo ulnar shortening osteotomy with arthroscopy, debridement of the TFCC, debridement of the wrist dictated by findings.  Pre, peri, and postoperative course have been discussed along with risks and complications.  She is aware that there is no guarantee with the surgery, possibility of infection; recurrence of injury to arteries, nerves, tendons; incomplete relief of symptoms; and dystrophy.  In the preoperative area, the patient is seen, the extremity marked by both the patient and surgeon.  Antibiotic given.  PROCEDURE IN DETAIL:  The patient was brought to the operating room after a supraclavicular block was carried out in the preoperative area. General anesthetic was given.  She was prepped and draped using ChloraPrep supine position, left arm free.  A 3-minute dry time was allowed.  Time-out taken, confirming the patient and procedure.  The limb was placed in the concept of arthroscopy tower, 10 pounds of traction applied.  The  joint inflated to 3/4 portal.  Transverse incision made, deepened with a hemostat.  Blunt trocar used to enter the joint.  Joint was inspected.  Scapholunate ligament was torn.  The TFCC showed a central tear with abrasion of the cartilage on the ulnar aspect of the lunate, lunotriquetral tear.  4/5 portal was opened.  Irrigation catheter placed in 6-view.  The area was probed.  A debridement was then performed using a full radius shaver of the triangular fibrocartilage complex tear in the area of injury to the lunotriquetral joint.  The midcarpal joint was inspected.  The scapholunate did show instability present with a probably a Geissler grade 2 tear.  The instruments were removed.  The limb exsanguinated with an Esmarch bandage.  Tourniquet placed high on the arm was inflated to 250 mmHg.  A longitudinal incision was then made over the ulnar aspect of her forearm and wrist, carried down through subcutaneous tissue.  Bleeders were electrocauterized with bipolar.  Dorsal sensory nerve was not seen, it was attended to and looked for, but was not seen in the area of the incision.  The dissection carried down to the interval between the flexor carpi ulnaris and extensor carpi ulnaris.  The periosteum incised.  The pronator quadratus elevated off from the distal ulna.  An Acumed  plate was then placed on the distal ulna.  This was extremely large for her small structure.  Attempt at clamping was unsuccessful and maintaining the plate in position, it was decided to hold it in position while this was applied.  A distal screw was then inserted after drilling with a drill.  An 11 mm screw was inserted.  This did not have extremely great purchase.  The plate was held in position.  It was found to be off the bone proximally by about 3 mm.  It was decided to bend the plate at this point in time, which was done allowing it to fit directly onto the shaft of the ulna.  The olive tip was then placed  proximally after drilling the 2 distal holes proximal to the screw hole that had been placed originally after removal of the plate and re-application.  This allowed the guide to be inserted.  After drilling it, the osteotomy guide was then placed on.  The first cut was made and this caused a fracture of the volar aspect of the ulna.  This was not felt to be destabilized in any way.  A second cut was made removing approximately 3 mm of ulna.  Compression was then applied after removal of the K-wires that stabilized the fixation devices.  The olive tip was removed.  The guides were loosened and compression placed, which was very unsuccessful.  The osteotomy was again opened to be certain there was no bone lodged within the osteotomy site.  This was entirely clear.  It was re-clamped and immediately gateway closing the osteotomy site fully. Remaining screws were placed.  The purchase was solid distally and that the bone was very soft in nature.  Proximal screws were placed, these measured 10, an oblique screw was placed measuring 16 mm.  After the compression screw was placed, the distal screws were placed, these measured 11, 12, and 12 mm, this gave adequate fixation distally, 1 mL of Trinity ELITE bone graft was then inserted and packed along the ulnar margin of the ulna after irrigation.  Periosteum was closed as much as possible with interrupted 4-0 Vicryl sutures.  Subcutaneous tissue was closed with interrupted 4-0 Vicryl.  X-rays revealed that the osteotomy site was fully closed.  The plate lied in good position, although slightly oblique.  The distal ulna was at a neutral position, which was felt to be adequate.  The skin was closed with interrupted and running 4- 0 nylon sutures.  Sterile compressive dressing, long-arm splint was applied.  On deflation of the tourniquet, all fingers immediately pinked.  She was taken to the recovery room for observation in a satisfactory condition.   She will be discharged home to [facility] 1 week on Percocet.          ______________________________ Cindee Salt, M.D.     GK/MEDQ  D:  06/07/2015  T:  06/08/2015  Job:  [id number]

## 2015-06-11 ENCOUNTER — Encounter (HOSPITAL_BASED_OUTPATIENT_CLINIC_OR_DEPARTMENT_OTHER): Payer: Self-pay | Admitting: Orthopedic Surgery

## 2015-06-13 DIAGNOSIS — M24132 Other articular cartilage disorders, left wrist: Secondary | ICD-10-CM | POA: Diagnosis not present

## 2015-07-23 DIAGNOSIS — M24132 Other articular cartilage disorders, left wrist: Secondary | ICD-10-CM | POA: Diagnosis not present

## 2015-08-14 DIAGNOSIS — M25512 Pain in left shoulder: Secondary | ICD-10-CM | POA: Diagnosis not present

## 2015-09-05 DIAGNOSIS — Z87311 Personal history of (healed) other pathological fracture: Secondary | ICD-10-CM | POA: Diagnosis not present

## 2015-09-05 DIAGNOSIS — M2410 Other articular cartilage disorders, unspecified site: Secondary | ICD-10-CM | POA: Diagnosis not present

## 2016-01-07 ENCOUNTER — Telehealth: Payer: Self-pay | Admitting: Family Medicine

## 2016-01-07 DIAGNOSIS — Z1231 Encounter for screening mammogram for malignant neoplasm of breast: Secondary | ICD-10-CM

## 2016-01-07 NOTE — Telephone Encounter (Signed)
Order placed and patient is aware

## 2016-01-07 NOTE — Telephone Encounter (Signed)
Pt would like to know if she should have a mammogram this year.  Pt is on Medicare can you put order in for labs

## 2016-04-07 ENCOUNTER — Ambulatory Visit
Admission: RE | Admit: 2016-04-07 | Discharge: 2016-04-07 | Disposition: A | Discharge status: Home / Self Care | Payer: Medicare Other | Source: Ambulatory Visit | Attending: Family Medicine | Admitting: Family Medicine

## 2016-04-07 DIAGNOSIS — Z1231 Encounter for screening mammogram for malignant neoplasm of breast: Secondary | ICD-10-CM | POA: Diagnosis not present

## 2016-05-19 ENCOUNTER — Other Ambulatory Visit: Payer: Self-pay

## 2016-07-29 ENCOUNTER — Ambulatory Visit (INDEPENDENT_AMBULATORY_CARE_PROVIDER_SITE_OTHER): Payer: Medicare Other | Admitting: Family Medicine

## 2016-07-29 ENCOUNTER — Encounter: Payer: Self-pay | Admitting: Family Medicine

## 2016-07-29 VITALS — HR 74 | Temp 97.3°F | Ht 59.25 in | Wt 105.8 lb

## 2016-07-29 DIAGNOSIS — Z23 Encounter for immunization: Secondary | ICD-10-CM | POA: Diagnosis not present

## 2016-07-29 DIAGNOSIS — E785 Hyperlipidemia, unspecified: Secondary | ICD-10-CM

## 2016-07-29 DIAGNOSIS — M818 Other osteoporosis without current pathological fracture: Secondary | ICD-10-CM | POA: Diagnosis not present

## 2016-07-29 LAB — CBC WITH DIFFERENTIAL/PLATELET
BASOS ABS: 0 10*3/uL (ref 0.0–0.1)
Basophils Relative: 0.4 % (ref 0.0–3.0)
EOS ABS: 0.1 10*3/uL (ref 0.0–0.7)
Eosinophils Relative: 1.3 % (ref 0.0–5.0)
HEMATOCRIT: 39.4 % (ref 36.0–46.0)
Hemoglobin: 13.4 g/dL (ref 12.0–15.0)
LYMPHS PCT: 36.4 % (ref 12.0–46.0)
Lymphs Abs: 1.9 10*3/uL (ref 0.7–4.0)
MCHC: 34 g/dL (ref 30.0–36.0)
MCV: 95.2 fl (ref 78.0–100.0)
MONOS PCT: 7.8 % (ref 3.0–12.0)
Monocytes Absolute: 0.4 10*3/uL (ref 0.1–1.0)
NEUTROS ABS: 2.8 10*3/uL (ref 1.4–7.7)
Neutrophils Relative %: 54.1 % (ref 43.0–77.0)
PLATELETS: 208 10*3/uL (ref 150.0–400.0)
RBC: 4.14 Mil/uL (ref 3.87–5.11)
RDW: 12.9 % (ref 11.5–15.5)
WBC: 5.2 10*3/uL (ref 4.0–10.5)

## 2016-07-29 LAB — POCT URINALYSIS DIPSTICK
Bilirubin, UA: NEGATIVE
GLUCOSE UA: NEGATIVE
Ketones, UA: NEGATIVE
LEUKOCYTES UA: NEGATIVE
NITRITE UA: NEGATIVE
PH UA: 5.5
Protein, UA: NEGATIVE
RBC UA: NEGATIVE
Spec Grav, UA: 1.01
UROBILINOGEN UA: 0.2

## 2016-07-29 LAB — LIPID PANEL
CHOL/HDL RATIO: 2
Cholesterol: 341 mg/dL — ABNORMAL HIGH (ref 0–200)
HDL: 139.8 mg/dL (ref >39.00)
LDL CALC: 179 mg/dL — AB (ref 0–99)
NONHDL: 201.31
Triglycerides: 111 mg/dL (ref 0.0–149.0)
VLDL: 22.2 mg/dL (ref 0.0–40.0)

## 2016-07-29 LAB — BASIC METABOLIC PANEL
BUN: 15 mg/dL (ref 6–23)
CO2: 30 meq/L (ref 19–32)
Calcium: 9.9 mg/dL (ref 8.4–10.5)
Chloride: 103 mEq/L (ref 96–112)
Creatinine, Ser: 1.03 mg/dL (ref 0.40–1.20)
GFR: 56.68 mL/min — AB (ref >60.00)
Glucose, Bld: 94 mg/dL (ref 70–99)
POTASSIUM: 4.2 meq/L (ref 3.5–5.1)
SODIUM: 141 meq/L (ref 135–145)

## 2016-07-29 LAB — TSH: TSH: 2.44 u[IU]/mL (ref 0.35–4.50)

## 2016-07-29 NOTE — Progress Notes (Signed)
Jennifer Lutz is a 65 year old single female nonsmoker who comes in today for general physical examination  She gets routine eye care, dental care, BSE monthly, and you mammography, colonoscopy 2010 was normal.  Vaccinations tetanus booster 2010 seasonal flu shot given today  She needs a yellow fever shot. [place].  She had surgery in her left ulna by Dr. Merlyn LotKuzma slowly getting better  LMP at age 65 are previous Paps were normal. Last Pap was 2015. She's had bone densities in the past which evolved to show some mild bone loss. She exercises daily and takes calcium vitamin D.  Cognitive function normal she exercises daily home health safety reviewed no issues identified, no guns in the house, she does have a healthcare power of attorney and living well  Family history unchanged  Social history...Marland Kitchen.Marland Kitchen.Marland Kitchen. single enjoys playing golf gardening reading and traveling  14 point review of systems reviewed and are negative  Physical evaluationPulse 74   Temp 97.3 F (36.3 C) (Oral)   Ht 4' 11.25" (1.505 m)   Wt 105 lb 12.8 oz (48 kg)   SpO2 98%   BMI 21.19 kg/m  HEENT were negative neck was supple no adenopathy thyroid normal cardiopulmonary exam normal abdominal breast exam normal abdominal exam normal extremities normal skin normal peripheral pulses normal.  Impression healthy female #2  #2 osteopenia........... continue diet exercise calcium vitamin D.

## 2016-07-29 NOTE — Patient Instructions (Signed)
Continue calcium vitamin D and exercise  Labs today.......Marland Kitchen. we will call you if there is anything abnormal  Return in one year for general physical exam sooner if any problems

## 2016-07-29 NOTE — Progress Notes (Signed)
Pre visit review using our clinic review tool, if applicable. No additional management support is needed unless otherwise documented below in the visit note. 

## 2016-08-04 ENCOUNTER — Telehealth: Payer: Self-pay | Admitting: Family Medicine

## 2016-08-04 NOTE — Telephone Encounter (Signed)
Pt would like you to call them concerning lab results. Pt cannot access mychart, and is frustrated, does not want to talk with mychart help desk. Just wants you to call with results.

## 2016-08-06 NOTE — Telephone Encounter (Signed)
Called and spoke with pt informing her that her labs have not yet been interpreted. I explained that Dr. Tawanna Coolerodd would review them and let me know and I would give her a call back. Pt verbalized understanding nothing further needed at this time.

## 2016-08-11 NOTE — Telephone Encounter (Signed)
Per Dr Tawanna Coolerodd pt's lab results were within normal limits.  Spoke with pt and advised. Nothing further needed.

## 2016-10-03 ENCOUNTER — Ambulatory Visit (INDEPENDENT_AMBULATORY_CARE_PROVIDER_SITE_OTHER): Payer: Medicare Other

## 2016-10-03 VITALS — BP 146/86 | HR 62 | Ht 59.5 in | Wt 107.4 lb

## 2016-10-03 DIAGNOSIS — Z0000 Encounter for general adult medical examination without abnormal findings: Secondary | ICD-10-CM

## 2016-10-03 DIAGNOSIS — Z7289 Other problems related to lifestyle: Secondary | ICD-10-CM | POA: Diagnosis not present

## 2016-10-03 NOTE — Progress Notes (Addendum)
Subjective:   Jennifer Lutz is a 66 y.o. female who presents for Medicare Annual (Subsequent) preventive examination.  The Patient was informed that the wellness visit is to identify future health risk and educate and initiate measures that can reduce risk for increased disease through the lifespan.    NO ROS; Medicare Wellness Visit  Describes health as good, fair or great? Great   The following written screening schedule of preventive measures were reviewed with assessment and plan made per below and patient instructions:  Smoking history reviewed /never smoked  Mother smoked when she was very young but then quit   ETOH yes  RISK FACTORS Regular exercise-  In the winter; she does 30 min on the elliptical Stretch bands and weight; every other day Loves to play golf   Planning a trip to [place] in August of this year   Diet; Avoids white food;  No salt, sugar or flour Fruit and does smoothies;  incorporates fruits and vegetables;  Lipids reviewed and reducing cholesterol discussed  Chol 341; HDL 139; LDL 179; trig 111 (diabetes neg) Drinks red wine; olive oil; coconut oil  Lemon juice and vinegar and honey to smoothies     Fall risk: no presented mobile; get up and go WNL  Mobility of Functional changes this year? no  Safety at home and  Community reviewed; wears sunscreen if in the sun;   Depression Screen PhQ 2: negative  Activities of Daily Living - See functional screen   Hearing Screening Comments: Hearing issues Can hear a whisper Vision Screening Comments: Vision is very good contact in right eye Goes to [employer], joh   Cognitive testing; Ad8 score; 0 or less than 2  MMSE deferred or completed if AD8 + 2 issues  Advanced Directives reviewed for completion; discussion with MD as well as supportive resources as needed  Patient care team reviewed   Preventives screens reviewed Colonoscopy 09/2008 at 58  09/2018 Mammogram  03/2016 Dexa 01/2015 -1.8; was taking fosamax and she has stopped  Pap 2015    Immunization History  Administered Date(s) Administered  . Influenza Whole 07/08/2007, 10/16/2009  . Influenza, High Dose Seasonal PF 07/29/2016  . Pneumococcal Conjugate-13 01/30/2015  . Pneumococcal Polysaccharide-23 12/26/2013  . Td 09/22/1997, 09/29/2008  . Tetanus 12/26/2013  . Zoster 03/16/2015   Required Immunizations needed today  Screening test up to date or reviewed for plan of completion Health Maintenance Due  Topic Date Due  . Hepatitis C Screening  [birth year]     Cardiac Risk Factors include: advanced age (>27men, >30 women);hypertension     Objective:     Vitals: BP (!) 146/86   Pulse 62   Ht 4' 11.5" (1.511 m)   Wt 107 lb 6 oz (48.7 kg)   SpO2 98%   BMI 21.32 kg/m   Body mass index is 21.32 kg/m.   Tobacco History  Smoking Status  . Never Smoker  Smokeless Tobacco  . Never Used     Counseling given: Yes   Past Medical History:  Diagnosis Date  . Complication of anesthesia    'takes very little.' 'Hard to wake up'  . Medical history non-contributory    Past Surgical History:  Procedure Laterality Date  . NASAL SEPTUM SURGERY  1985  . WRIST ARTHROSCOPY WITH ULNA SHORTENING Left 06/07/2015   Procedure: ARTHROSCOPY LEFT WRIST DEBRIDEMENT ULNAR SHORTENING;  Surgeon: Cindee Salt, MD;  Location: [facility];  Service: Orthopedics;  Laterality: Left;  . WRIST  OSTEOTOMY Left 06/07/2015   Procedure: LEFT OSTEOTOMY;  Surgeon: Cindee Salt, MD;  Location: [facility];  Service: Orthopedics;  Laterality: Left;   Family History  Problem Relation Age of Onset  . Stroke Other    History  Sexual Activity  . Sexual activity: Not on file    Outpatient Encounter Prescriptions as of 10/03/2016  Medication Sig  . aspirin 81 MG tablet Take 81 mg by mouth daily.  . B Complex Vitamins (VITAMIN B COMPLEX PO) Take by mouth.  . Multiple  Vitamins-Minerals (HAIR/SKIN/NAILS PO) Take by mouth.  . Omega-3 Fatty Acids (OMEGA-3 FISH OIL) 500 MG CAPS Take 50 mg by mouth.  Marland Kitchen OVER THE COUNTER MEDICATION Calcium with D3 500 mg  . oxyCODONE-acetaminophen (PERCOCET) 10-325 MG per tablet Take 1 tablet by mouth every 4 (four) hours as needed for pain. (Patient not taking: Reported on 10/03/2016)   No facility-administered encounter medications on file as of 10/03/2016.     Activities of Daily Living In your present state of health, do you have any difficulty performing the following activities: 10/03/2016  Hearing? N  Vision? N  Difficulty concentrating or making decisions? N  Walking or climbing stairs? N  Dressing or bathing? N  Doing errands, shopping? N  Preparing Food and eating ? N  Using the Toilet? N  In the past six months, have you accidently leaked urine? N  Do you have problems with loss of bowel control? N  Managing your Medications? N  Managing your Finances? N  Housekeeping or managing your Housekeeping? N  Some recent data might be hidden    Patient Care Team: Roderick Pee, MD as PCP - General (Family Medicine)    Assessment:    Taking clalcium and Vit d for Osteopenia Exercise is excellent  Exercise Activities and Dietary recommendations Current Exercise Habits: Home exercise routine, Type of exercise: strength training/weights;walking, Time (Minutes): 60, Frequency (Times/Week): 4, Weekly Exercise (Minutes/Week): 240, Intensity: Moderate  Goals    . Weight (lb) < 102 lb (46.3 kg)          Will try to eat more vegetables Can add frozen smoothies to meats etc       Fall Risk Fall Risk  10/03/2016 07/29/2016 05/19/2016 01/30/2015 12/26/2013  Falls in the past year? No No No No No   Depression Screen PHQ 2/9 Scores 10/03/2016 07/29/2016 01/30/2015 12/26/2013  PHQ - 2 Score 0 0 0 0     Cognitive Function MMSE - Mini Mental State Exam 10/03/2016  Not completed: (No Data)        Immunization History   Administered Date(s) Administered  . Influenza Whole 07/08/2007, 10/16/2009  . Influenza, High Dose Seasonal PF 07/29/2016  . Pneumococcal Conjugate-13 01/30/2015  . Pneumococcal Polysaccharide-23 12/26/2013  . Td 09/22/1997, 09/29/2008  . Tetanus 12/26/2013  . Zoster 03/16/2015   Screening Tests Health Maintenance  Topic Date Due  . Hepatitis C Screening  09/15/1951  . MAMMOGRAM  04/07/2018  . COLONOSCOPY  09/22/2018  . TETANUS/TDAP  12/27/2023  . INFLUENZA VACCINE  Completed  . DEXA SCAN  Completed  . ZOSTAVAX  Completed  . PNA vac Low Risk Adult  Completed      Plan:   Hepatitis C ordered for future blood draw and educated: Medicare now request all "baby boomers" test for possible exposure to Hepatitis C. Many may have been exposed due to dental work, tatoo's, vaccinations when young. The Hepatitis C virus is dormant for many years and  then sometimes will cause liver cancer. If you gave blood in the past 15 years, you were most likely checked for Hep C. If you rec'd blood; you may want to consider testing or if you are high risk for any other reason.   Women's preventive health reviewed;  No issues   During the course of the visit the patient was educated and counseled about the following appropriate screening and preventive services:   Vaccines to include Pneumoccal, Influenza, Hepatitis B, Td, Zostavax, HCV  Electrocardiogram  Cardiovascular Disease  Colorectal cancer screening  Bone density screening  Diabetes screening  Glaucoma screening  Mammography/ annual   Nutrition counseling   Patient Instructions (the written plan) was given to the patient.   Coral Soler, RN  10/03/2016   Above notes/assessment reviewed and agree.  She has very high cholesterol but HDL of 139.  Kristian CoveyBruce W Burchette MD [facility]

## 2016-10-03 NOTE — Patient Instructions (Addendum)
[name] , Thank you for taking time to come for your Medicare Wellness Visit. I appreciate your ongoing commitment to your health goals. Please review the following plan we discussed and let me know if I can assist you in the future.   Will have hepatitis c drawn next blood draw    These are the goals we discussed: Goals    . Weight (lb) < 102 lb (46.3 kg)          Will try to eat more vegetables Can add frozen smoothies to meats etc        This is a list of the screening recommended for you and due dates:  Health Maintenance  Topic Date Due  .  Hepatitis C: One time screening is recommended by Center for Disease Control  (CDC) for  adults born from [birth year] through 1967.   [birth year]  . Mammogram  04/07/2018  . Colon Cancer Screening  09/22/2018  . Tetanus Vaccine  12/27/2023  . Flu Shot  Completed  . DEXA scan (bone density measurement)  Completed  . Shingles Vaccine  Completed  . Pneumonia vaccines  Completed      Fall Prevention in the Home Introduction Falls can cause injuries. They can happen to people of all ages. There are many things you can do to make your home safe and to help prevent falls. What can I do on the outside of my home?  Regularly fix the edges of walkways and driveways and fix any cracks.  Remove anything that might make you trip as you walk through a door, such as a raised step or threshold.  Trim any bushes or trees on the path to your home.  Use bright outdoor lighting.  Clear any walking paths of anything that might make someone trip, such as rocks or tools.  Regularly check to see if handrails are loose or broken. Make sure that both sides of any steps have handrails.  Any raised decks and porches should have guardrails on the edges.  Have any leaves, snow, or ice cleared regularly.  Use sand or salt on walking paths during winter.  Clean up any spills in your garage right away. This includes oil or grease spills. What can I do in  the bathroom?  Use night lights.  Install grab bars by the toilet and in the tub and shower. Do not use towel bars as grab bars.  Use non-skid mats or decals in the tub or shower.  If you need to sit down in the shower, use a plastic, non-slip stool.  Keep the floor dry. Clean up any water that spills on the floor as soon as it happens.  Remove soap buildup in the tub or shower regularly.  Attach bath mats securely with double-sided non-slip rug tape.  Do not have throw rugs and other things on the floor that can make you trip. What can I do in the bedroom?  Use night lights.  Make sure that you have a light by your bed that is easy to reach.  Do not use any sheets or blankets that are too big for your bed. They should not hang down onto the floor.  Have a firm chair that has side arms. You can use this for support while you get dressed.  Do not have throw rugs and other things on the floor that can make you trip. What can I do in the kitchen?  Clean up any spills right away.  Avoid walking  on wet floors.  Keep items that you use a lot in easy-to-reach places.  If you need to reach something above you, use a strong step stool that has a grab bar.  Keep electrical cords out of the way.  Do not use floor polish or wax that makes floors slippery. If you must use wax, use non-skid floor wax.  Do not have throw rugs and other things on the floor that can make you trip. What can I do with my stairs?  Do not leave any items on the stairs.  Make sure that there are handrails on both sides of the stairs and use them. Fix handrails that are broken or loose. Make sure that handrails are as long as the stairways.  Check any carpeting to make sure that it is firmly attached to the stairs. Fix any carpet that is loose or worn.  Avoid having throw rugs at the top or bottom of the stairs. If you do have throw rugs, attach them to the floor with carpet tape.  Make sure that you  have a light switch at the top of the stairs and the bottom of the stairs. If you do not have them, ask someone to add them for you. What else can I do to help prevent falls?  Wear shoes that:  Do not have high heels.  Have rubber bottoms.  Are comfortable and fit you well.  Are closed at the toe. Do not wear sandals.  If you use a stepladder:  Make sure that it is fully opened. Do not climb a closed stepladder.  Make sure that both sides of the stepladder are locked into place.  Ask someone to hold it for you, if possible.  Clearly mark and make sure that you can see:  Any grab bars or handrails.  First and last steps.  Where the edge of each step is.  Use tools that help you move around (mobility aids) if they are needed. These include:  Canes.  Walkers.  Scooters.  Crutches.  Turn on the lights when you go into a dark area. Replace any light bulbs as soon as they burn out.  Set up your furniture so you have a clear path. Avoid moving your furniture around.  If any of your floors are uneven, fix them.  If there are any pets around you, be aware of where they are.  Review your medicines with your doctor. Some medicines can make you feel dizzy. This can increase your chance of falling. Ask your doctor what other things that you can do to help prevent falls. This information is not intended to replace advice given to you by your health care provider. Make sure you discuss any questions you have with your health care provider. Document Released: 07/05/2009 Document Revised: 02/14/2016 Document Reviewed: 10/13/2014  2017 [employer]  Health Maintenance, Female Introduction Adopting a healthy lifestyle and getting preventive care can go a long way to promote health and wellness. Talk with your health care provider about what schedule of regular examinations is right for you. This is a good chance for you to check in with your provider about disease prevention and  staying healthy. In between checkups, there are plenty of things you can do on your own. Experts have done a lot of research about which lifestyle changes and preventive measures are most likely to keep you healthy. Ask your health care provider for more information. Weight and diet Eat a healthy diet  Be sure to  include plenty of vegetables, fruits, low-fat dairy products, and lean protein.  Do not eat a lot of foods high in solid fats, added sugars, or salt.  Get regular exercise. This is one of the most important things you can do for your health.  Most adults should exercise for at least 150 minutes each week. The exercise should increase your heart rate and make you sweat (moderate-intensity exercise).  Most adults should also do strengthening exercises at least twice a week. This is in addition to the moderate-intensity exercise. Maintain a healthy weight  Body mass index (BMI) is a measurement that can be used to identify possible weight problems. It estimates body fat based on height and weight. Your health care provider can help determine your BMI and help you achieve or maintain a healthy weight.  For females 30 years of age and older:  A BMI below 18.5 is considered underweight.  A BMI of 18.5 to 24.9 is normal.  A BMI of 25 to 29.9 is considered overweight.  A BMI of 30 and above is considered obese. Watch levels of cholesterol and blood lipids  You should start having your blood tested for lipids and cholesterol at 68 years of age, then have this test every 5 years.  You may need to have your cholesterol levels checked more often if:  Your lipid or cholesterol levels are high.  You are older than 68 years of age.  You are at high risk for heart disease. Cancer screening Lung Cancer  Lung cancer screening is recommended for adults 62-62 years old who are at high risk for lung cancer because of a history of smoking.  A yearly low-dose CT scan of the lungs is  recommended for people who:  Currently smoke.  Have quit within the past 15 years.  Have at least a 30-pack-year history of smoking. A pack year is smoking an average of one pack of cigarettes a day for 1 year.  Yearly screening should continue until it has been 15 years since you quit.  Yearly screening should stop if you develop a health problem that would prevent you from having lung cancer treatment. Breast Cancer  Practice breast self-awareness. This means understanding how your breasts normally appear and feel.  It also means doing regular breast self-exams. Let your health care provider know about any changes, no matter how small.  If you are in your 20s or 30s, you should have a clinical breast exam (CBE) by a health care provider every 1-3 years as part of a regular health exam.  If you are 54 or older, have a CBE every year. Also consider having a breast X-ray (mammogram) every year.  If you have a family history of breast cancer, talk to your health care provider about genetic screening.  If you are at high risk for breast cancer, talk to your health care provider about having an MRI and a mammogram every year.  Breast cancer gene (BRCA) assessment is recommended for women who have family members with BRCA-related cancers. BRCA-related cancers include:  Breast.  Ovarian.  Tubal.  Peritoneal cancers.  Results of the assessment will determine the need for genetic counseling and BRCA1 and BRCA2 testing. Cervical Cancer  Your health care provider may recommend that you be screened regularly for cancer of the pelvic organs (ovaries, uterus, and vagina). This screening involves a pelvic examination, including checking for microscopic changes to the surface of your cervix (Pap test). You may be encouraged to have this  screening done every 3 years, beginning at age 13.  For women ages 28-65, health care providers may recommend pelvic exams and Pap testing every 3 years, or  they may recommend the Pap and pelvic exam, combined with testing for human papilloma virus (HPV), every 5 years. Some types of HPV increase your risk of cervical cancer. Testing for HPV may also be done on women of any age with unclear Pap test results.  Other health care providers may not recommend any screening for nonpregnant women who are considered low risk for pelvic cancer and who do not have symptoms. Ask your health care provider if a screening pelvic exam is right for you.  If you have had past treatment for cervical cancer or a condition that could lead to cancer, you need Pap tests and screening for cancer for at least 20 years after your treatment. If Pap tests have been discontinued, your risk factors (such as having a new sexual partner) need to be reassessed to determine if screening should resume. Some women have medical problems that increase the chance of getting cervical cancer. In these cases, your health care provider may recommend more frequent screening and Pap tests. Colorectal Cancer  This type of cancer can be detected and often prevented.  Routine colorectal cancer screening usually begins at 68 years of age and continues through 68 years of age.  Your health care provider may recommend screening at an earlier age if you have risk factors for colon cancer.  Your health care provider may also recommend using home test kits to check for hidden blood in the stool.  A small camera at the end of a tube can be used to examine your colon directly (sigmoidoscopy or colonoscopy). This is done to check for the earliest forms of colorectal cancer.  Routine screening usually begins at age 30.  Direct examination of the colon should be repeated every 5-10 years through 68 years of age. However, you may need to be screened more often if early forms of precancerous polyps or small growths are found. Skin Cancer  Check your skin from head to toe regularly.  Tell your health care  provider about any new moles or changes in moles, especially if there is a change in a mole's shape or color.  Also tell your health care provider if you have a mole that is larger than the size of a pencil eraser.  Always use sunscreen. Apply sunscreen liberally and repeatedly throughout the day.  Protect yourself by wearing long sleeves, pants, a wide-brimmed hat, and sunglasses whenever you are outside. Heart disease, diabetes, and high blood pressure  High blood pressure causes heart disease and increases the risk of stroke. High blood pressure is more likely to develop in:  People who have blood pressure in the high end of the normal range (130-139/85-89 mm Hg).  People who are overweight or obese.  People who are African American.  If you are 37-82 years of age, have your blood pressure checked every 3-5 years. If you are 49 years of age or older, have your blood pressure checked every year. You should have your blood pressure measured twice-once when you are at a hospital or clinic, and once when you are not at a hospital or clinic. Record the average of the two measurements. To check your blood pressure when you are not at a hospital or clinic, you can use:  An automated blood pressure machine at a pharmacy.  A home blood pressure monitor.  If you are between 1 years and 4 years old, ask your health care provider if you should take aspirin to prevent strokes.  Have regular diabetes screenings. This involves taking a blood sample to check your fasting blood sugar level.  If you are at a normal weight and have a low risk for diabetes, have this test once every three years after 68 years of age.  If you are overweight and have a high risk for diabetes, consider being tested at a younger age or more often. Preventing infection Hepatitis B  If you have a higher risk for hepatitis B, you should be screened for this virus. You are considered at high risk for hepatitis B if:  You  were born in a country where hepatitis B is common. Ask your health care provider which countries are considered high risk.  Your parents were born in a high-risk country, and you have not been immunized against hepatitis B (hepatitis B vaccine).  You have HIV or AIDS.  You use needles to inject street drugs.  You live with someone who has hepatitis B.  You have had sex with someone who has hepatitis B.  You get hemodialysis treatment.  You take certain medicines for conditions, including cancer, organ transplantation, and autoimmune conditions. Hepatitis C  Blood testing is recommended for:  Everyone born from [birth year] through 1967.  Anyone with known risk factors for hepatitis C. Sexually transmitted infections (STIs)  You should be screened for sexually transmitted infections (STIs) including gonorrhea and chlamydia if:  You are sexually active and are younger than 68 years of age.  You are older than 68 years of age and your health care provider tells you that you are at risk for this type of infection.  Your sexual activity has changed since you were last screened and you are at an increased risk for chlamydia or gonorrhea. Ask your health care provider if you are at risk.  If you do not have HIV, but are at risk, it may be recommended that you take a prescription medicine daily to prevent HIV infection. This is called pre-exposure prophylaxis (PrEP). You are considered at risk if:  You are sexually active and do not regularly use condoms or know the HIV status of your partner(s).  You take drugs by injection.  You are sexually active with a partner who has HIV. Talk with your health care provider about whether you are at high risk of being infected with HIV. If you choose to begin PrEP, you should first be tested for HIV. You should then be tested every 3 months for as long as you are taking PrEP. Pregnancy  If you are premenopausal and you may become pregnant, ask your  health care provider about preconception counseling.  If you may become pregnant, take 400 to 800 micrograms (mcg) of folic acid every day.  If you want to prevent pregnancy, talk to your health care provider about birth control (contraception). Osteoporosis and menopause  Osteoporosis is a disease in which the bones lose minerals and strength with aging. This can result in serious bone fractures. Your risk for osteoporosis can be identified using a bone density scan.  If you are 2 years of age or older, or if you are at risk for osteoporosis and fractures, ask your health care provider if you should be screened.  Ask your health care provider whether you should take a calcium or vitamin D supplement to lower your risk for osteoporosis.  Menopause may  have certain physical symptoms and risks.  Hormone replacement therapy may reduce some of these symptoms and risks. Talk to your health care provider about whether hormone replacement therapy is right for you. Follow these instructions at home:  Schedule regular health, dental, and eye exams.  Stay current with your immunizations.  Do not use any tobacco products including cigarettes, chewing tobacco, or electronic cigarettes.  If you are pregnant, do not drink alcohol.  If you are breastfeeding, limit how much and how often you drink alcohol.  Limit alcohol intake to no more than 1 drink per day for nonpregnant women. One drink equals 12 ounces of beer, 5 ounces of wine, or 1 ounces of hard liquor.  Do not use street drugs.  Do not share needles.  Ask your health care provider for help if you need support or information about quitting drugs.  Tell your health care provider if you often feel depressed.  Tell your health care provider if you have ever been abused or do not feel safe at home. This information is not intended to replace advice given to you by your health care provider. Make sure you discuss any questions you have  with your health care provider. Document Released: 03/24/2011 Document Revised: 02/14/2016 Document Reviewed: 06/12/2015  2017 [employer]

## 2017-05-28 DIAGNOSIS — H2513 Age-related nuclear cataract, bilateral: Secondary | ICD-10-CM | POA: Diagnosis not present

## 2017-06-01 ENCOUNTER — Other Ambulatory Visit: Payer: Self-pay | Admitting: Family Medicine

## 2017-06-01 DIAGNOSIS — Z1231 Encounter for screening mammogram for malignant neoplasm of breast: Secondary | ICD-10-CM

## 2017-06-04 ENCOUNTER — Ambulatory Visit
Admission: RE | Admit: 2017-06-04 | Discharge: 2017-06-04 | Disposition: A | Discharge status: Home / Self Care | Payer: Medicare Other | Source: Ambulatory Visit | Attending: Family Medicine | Admitting: Family Medicine

## 2017-06-04 DIAGNOSIS — Z1231 Encounter for screening mammogram for malignant neoplasm of breast: Secondary | ICD-10-CM | POA: Diagnosis not present

## 2017-06-12 ENCOUNTER — Encounter: Payer: Self-pay | Admitting: Family Medicine

## 2017-08-10 ENCOUNTER — Encounter: Payer: Self-pay | Admitting: Gastroenterology

## 2017-08-20 DIAGNOSIS — H11153 Pinguecula, bilateral: Secondary | ICD-10-CM | POA: Diagnosis not present

## 2017-08-31 ENCOUNTER — Ambulatory Visit: Payer: Medicare Other | Admitting: Family Medicine

## 2017-09-07 ENCOUNTER — Ambulatory Visit (INDEPENDENT_AMBULATORY_CARE_PROVIDER_SITE_OTHER): Payer: Medicare Other | Admitting: Family Medicine

## 2017-09-07 ENCOUNTER — Encounter: Payer: Self-pay | Admitting: Family Medicine

## 2017-09-07 ENCOUNTER — Other Ambulatory Visit (HOSPITAL_COMMUNITY)
Admission: RE | Admit: 2017-09-07 | Discharge: 2017-09-07 | Disposition: A | Discharge status: Home / Self Care | Payer: Medicare Other | Source: Ambulatory Visit | Attending: Family Medicine | Admitting: Family Medicine

## 2017-09-07 VITALS — BP 110/78 | HR 66 | Temp 97.6°F | Ht <= 58 in | Wt 102.0 lb

## 2017-09-07 DIAGNOSIS — Z124 Encounter for screening for malignant neoplasm of cervix: Secondary | ICD-10-CM | POA: Diagnosis not present

## 2017-09-07 DIAGNOSIS — Z23 Encounter for immunization: Secondary | ICD-10-CM | POA: Diagnosis not present

## 2017-09-07 DIAGNOSIS — E789 Disorder of lipoprotein metabolism, unspecified: Secondary | ICD-10-CM | POA: Insufficient documentation

## 2017-09-07 DIAGNOSIS — M818 Other osteoporosis without current pathological fracture: Secondary | ICD-10-CM | POA: Diagnosis not present

## 2017-09-07 LAB — POCT URINALYSIS DIPSTICK
BILIRUBIN UA: NEGATIVE
Glucose, UA: NEGATIVE
Ketones, UA: NEGATIVE
LEUKOCYTES UA: NEGATIVE
NITRITE UA: NEGATIVE
PH UA: 5.5 (ref 5.0–8.0)
PROTEIN UA: NEGATIVE
RBC UA: NEGATIVE
Spec Grav, UA: 1.02 (ref 1.010–1.025)
UROBILINOGEN UA: 0.2 U/dL

## 2017-09-07 NOTE — Patient Instructions (Signed)
Continue good diet exercise and health habits  Labs today......... I will call you if there is anything abnormal  Return in one year for general physical exam sooner if any problems  Check with your insurance company to find out where he get the new shingles vaccine.......[facility]land Kitchen. remember to take 40 mg of Motrin every 4-6 hours after the vaccination for a day or 2 to help negate the side effects of fever chills and a can all over

## 2017-09-07 NOTE — Progress Notes (Signed)
Jennifer Lutz is a 66 year old single female nonsmoker who comes in today for general physical evaluation  She is on his been [facility] she's had no chronic health problems. She takes a baby aspirin daily but no other medication.  She gets routine eye care, dental care, BSE monthly, annual mammography, colonoscopy 2010 showed 2 polyps. She was advised to come back in 10 years  Vaccinations tetanus booster 2010 seasonal flu shot today information given on shingles  She still has her uterus and ovaries and last Pap was 2015. She's never had any trouble with her Pap smears in the past.  14 point review of systems reviewed and otherwise negative  Social history......Marland Kitchen. [place] and bowls. Extremely physically active.  Cognitive function normal she exercises daily home health safety reviewed no issues identified, no guns in the house, she does have a healthcare power of attorney and living well  Family history mother lived to be 95.  She's had a history of high cholesterol in the 341 range. But because of her high HDL we've elected to treat that. And her family history is negative..  BP 110/78 (BP Location: Left Arm, Patient Position: Sitting, Cuff Size: Normal)   Pulse 66   Temp 97.6 F (36.4 C) (Oral)   Ht 4\' 9"  (1.448 m)   Wt 102 lb (46.3 kg)   BMI 22.07 kg/m  Well-developed well-nourished female no acute distress examination HEENT were negative neck was supple thyroid is not enlarged no carotid bruits cardiopulmonary exam normal breast exam was normal except for very dense breast tissue. Abdominal exam was normal pelvic examination external genitalia within normal limits vaginal vault was normal cervix is visualized Pap smears done bimanual exam negative extremities normal skin normal peripheral pulses normal  #1 healthy female  #2 hyperlipidemia........Marland Kitchen. with negative family history normal blood pressure etc. I do not think this needs to be treated..Marland Kitchen

## 2017-09-08 LAB — LIPID PANEL
CHOLESTEROL: 274 mg/dL — AB (ref 0–200)
HDL: 100.4 mg/dL (ref >39.00)
LDL Cholesterol: 143 mg/dL — ABNORMAL HIGH (ref 0–99)
NonHDL: 173.93
TRIGLYCERIDES: 157 mg/dL — AB (ref 0.0–149.0)
Total CHOL/HDL Ratio: 3
VLDL: 31.4 mg/dL (ref 0.0–40.0)

## 2017-09-08 LAB — TSH: TSH: 1.46 u[IU]/mL (ref 0.35–4.50)

## 2017-09-08 LAB — HEPATIC FUNCTION PANEL
ALBUMIN: 4.6 g/dL (ref 3.5–5.2)
ALK PHOS: 48 U/L (ref 39–117)
ALT: 13 U/L (ref 0–35)
AST: 21 U/L (ref 0–37)
BILIRUBIN DIRECT: 0.1 mg/dL (ref 0.0–0.3)
TOTAL PROTEIN: 7.1 g/dL (ref 6.0–8.3)
Total Bilirubin: 0.5 mg/dL (ref 0.2–1.2)

## 2017-09-08 LAB — BASIC METABOLIC PANEL
BUN: 21 mg/dL (ref 6–23)
CO2: 29 mEq/L (ref 19–32)
Calcium: 9.8 mg/dL (ref 8.4–10.5)
Chloride: 100 mEq/L (ref 96–112)
Creatinine, Ser: 1.01 mg/dL (ref 0.40–1.20)
GFR: 57.79 mL/min — AB (ref >60.00)
Glucose, Bld: 85 mg/dL (ref 70–99)
POTASSIUM: 4.1 meq/L (ref 3.5–5.1)
SODIUM: 139 meq/L (ref 135–145)

## 2017-09-08 LAB — CBC WITH DIFFERENTIAL/PLATELET
BASOS ABS: 0 10*3/uL (ref 0.0–0.1)
Basophils Relative: 0.6 % (ref 0.0–3.0)
EOS ABS: 0.1 10*3/uL (ref 0.0–0.7)
Eosinophils Relative: 1.4 % (ref 0.0–5.0)
HCT: 42.2 % (ref 36.0–46.0)
Hemoglobin: 13.8 g/dL (ref 12.0–15.0)
LYMPHS ABS: 1.8 10*3/uL (ref 0.7–4.0)
Lymphocytes Relative: 30.6 % (ref 12.0–46.0)
MCHC: 32.6 g/dL (ref 30.0–36.0)
MCV: 97.7 fl (ref 78.0–100.0)
MONO ABS: 0.4 10*3/uL (ref 0.1–1.0)
Monocytes Relative: 6.9 % (ref 3.0–12.0)
NEUTROS ABS: 3.6 10*3/uL (ref 1.4–7.7)
NEUTROS PCT: 60.5 % (ref 43.0–77.0)
PLATELETS: 245 10*3/uL (ref 150.0–400.0)
RBC: 4.32 Mil/uL (ref 3.87–5.11)
RDW: 13.3 % (ref 11.5–15.5)
WBC: 6 10*3/uL (ref 4.0–10.5)

## 2017-09-09 LAB — CYTOLOGY - PAP: DIAGNOSIS: NEGATIVE

## 2018-03-03 DIAGNOSIS — H2513 Age-related nuclear cataract, bilateral: Secondary | ICD-10-CM | POA: Diagnosis not present

## 2018-04-06 ENCOUNTER — Ambulatory Visit: Payer: Medicare Other

## 2018-04-19 NOTE — Progress Notes (Signed)
Subjective:   Jennifer Lutz is a 67 y.o. female who presents for Medicare Annual (Subsequent) preventive examination.  Reports health as good  08/2017  Diet BMI 20  Chol/hdl 3 Vegetables intake increased veg and fruit smoothies and puts baby spinach and kale    Exercise Works out qod 3 to 4 times a week Step bench x 34 minutes Light weights Biking Push up [employer]    Health Maintenance Due  Topic Date Due  . Hepatitis C Screening  [birth year]   Will have a hep c Mammogram 05/2017  Will have a dexa when she has her mammogram  Ordered today  dexa 01/2015 -1.8  Cardiac Risk Factors include: advanced age (>4855men, 48>65 women)     Objective:     Vitals: BP 122/70   Pulse 68   Ht 4\' 11"  (1.499 m)   Wt 102 lb (46.3 kg)   SpO2 98%   BMI 20.60 kg/m   Body mass index is 20.6 kg/m.  Advanced Directives 04/20/2018 10/03/2016 06/07/2015 05/31/2015  Does Patient Have a Medical Advance Directive? Yes No No No    Tobacco Social History   Tobacco Use  Smoking Status Never Smoker  Smokeless Tobacco Never Used     Counseling given: Yes   Clinical Intake:     Past Medical History:  Diagnosis Date  . Complication of anesthesia    'takes very little.' 'Hard to wake up'  . Medical history non-contributory    Past Surgical History:  Procedure Laterality Date  . NASAL SEPTUM SURGERY  1985  . WRIST ARTHROSCOPY WITH ULNA SHORTENING Left 06/07/2015   Procedure: ARTHROSCOPY LEFT WRIST DEBRIDEMENT ULNAR SHORTENING;  Surgeon: Cindee SaltGary Kuzma, MD;  Location: [facility];  Service: Orthopedics;  Laterality: Left;  . WRIST OSTEOTOMY Left 06/07/2015   Procedure: LEFT OSTEOTOMY;  Surgeon: Cindee SaltGary Kuzma, MD;  Location: [facility];  Service: Orthopedics;  Laterality: Left;   Family History  Problem Relation Age of Onset  . Stroke Other    Social History   Socioeconomic History  . Marital status: Married    Spouse name: Not on file  . Number of children:  Not on file  . Years of education: Not on file  . Highest education level: Not on file  Occupational History  . Not on file  Social Needs  . Financial resource strain: Not on file  . Food insecurity:    Worry: Not on file    Inability: Not on file  . Transportation needs:    Medical: Not on file    Non-medical: Not on file  Tobacco Use  . Smoking status: Never Smoker  . Smokeless tobacco: Never Used  Substance and Sexual Activity  . Alcohol use: Yes    Comment: not much   . Drug use: No  . Sexual activity: Not on file  Lifestyle  . Physical activity:    Days per week: Not on file    Minutes per session: Not on file  . Stress: Not on file  Relationships  . Social connections:    Talks on phone: Not on file    Gets together: Not on file    Attends religious service: Not on file    Active member of club or organization: Not on file    Attends meetings of clubs or organizations: Not on file    Relationship status: Not on file  Other Topics Concern  . Not on file  Social History Narrative  . Not  on file    Outpatient Encounter Medications as of 04/20/2018  Medication Sig  . aspirin 81 MG tablet Take 81 mg by mouth daily.  . B Complex Vitamins (VITAMIN B COMPLEX PO) Take by mouth.  . Multiple Vitamins-Minerals (HAIR/SKIN/NAILS PO) Take by mouth.  . Omega-3 Fatty Acids (OMEGA-3 FISH OIL) 500 MG CAPS Take 50 mg by mouth.  Marland Kitchen OVER THE COUNTER MEDICATION Calcium with D3 500 mg  . [DISCONTINUED] oxyCODONE-acetaminophen (PERCOCET) 10-325 MG per tablet Take 1 tablet by mouth every 4 (four) hours as needed for pain. (Patient not taking: Reported on 04/20/2018)   No facility-administered encounter medications on file as of 04/20/2018.     Activities of Daily Living In your present state of health, do you have any difficulty performing the following activities: 04/20/2018  Hearing? N  Vision? N  Difficulty concentrating or making decisions? N  Walking or climbing stairs? N    Dressing or bathing? N  Doing errands, shopping? N  Preparing Food and eating ? N  Using the Toilet? N  In the past six months, have you accidently leaked urine? N  Do you have problems with loss of bowel control? N  Managing your Medications? N  Managing your Finances? N  Housekeeping or managing your Housekeeping? N  Some recent data might be hidden    Patient Care Team: Roderick Pee, MD as PCP - General (Family Medicine)    Assessment:   This is a routine wellness examination for Jennifer Lutz.  Exercise Activities and Dietary recommendations Current Exercise Habits: Home exercise routine, Time (Minutes): 60, Frequency (Times/Week): 4, Weekly Exercise (Minutes/Week): 240, Intensity: Mild  Goals    . Exercise 150 min/wk Moderate Activity     Play more golf!    . Weight (lb) < 102 lb (46.3 kg)     Will try to eat more vegetables Can add frozen smoothies to meats etc        Fall Risk Fall Risk  04/20/2018 10/03/2016 07/29/2016 05/19/2016 01/30/2015  Falls in the past year? No No No No No  Comment - - - Emmi Telephone Survey: data to providers prior to load -     Depression Screen PHQ 2/9 Scores 04/20/2018 10/03/2016 07/29/2016 01/30/2015  PHQ - 2 Score 0 0 0 0     Cognitive Function MMSE - Mini Mental State Exam 04/20/2018 10/03/2016  Not completed: (No Data) (No Data)  Ad8 score reviewed for issues:  Issues making decisions:  Less interest in hobbies / activities:  Repeats questions, stories (family complaining):  Trouble using ordinary gadgets (microwave, computer, phone):  Forgets the month or year:   Mismanaging finances:   Remembering appts:  Daily problems with thinking and/or memory: Ad8 score is=0          Immunization History  Administered Date(s) Administered  . Influenza Whole 07/08/2007, 10/16/2009  . Influenza, High Dose Seasonal PF 07/29/2016, 09/07/2017  . Pneumococcal Conjugate-13 01/30/2015  . Pneumococcal Polysaccharide-23  12/26/2013  . Td 09/22/1997, 09/29/2008  . Tetanus 12/26/2013  . Zoster 03/16/2015     Screening Tests Health Maintenance  Topic Date Due  . Hepatitis C Screening  [birth year]  . INFLUENZA VACCINE  04/22/2018  . COLONOSCOPY  09/22/2018  . MAMMOGRAM  06/05/2019  . TETANUS/TDAP  12/27/2023  . DEXA SCAN  Completed  . PNA vac Low Risk Adult  Completed         Plan:      PCP Notes   Health Maintenance  Will have  a hep c Mammogram 05/2017  Will have a dexa when she has her mammogram this fall Ordered today  dexa 01/2015 -1.8  Colonoscopy due 2020   Educated regarding shingrix   Abnormal Screens  none  Referrals  none  Patient concerns; Educated regarding bone loss and calcium replacement. Will determine how much calcium she is getting in her food Referred to the osteoporosis foundation  Nurse Concerns; As noted  Next PCP apt TBS with Dr. Tawanna Cooler      I have personally reviewed and noted the following in the patient's chart:   . Medical and social history . Use of alcohol, tobacco or illicit drugs  . Current medications and supplements . Functional ability and status . Nutritional status . Physical activity . Advanced directives . List of other physicians . Hospitalizations, surgeries, and ER visits in previous 12 months . Vitals . Screenings to include cognitive, depression, and falls . Referrals and appointments  In addition, I have reviewed and discussed with patient certain preventive protocols, quality metrics, and best practice recommendations. A written personalized care plan for preventive services as well as general preventive health recommendations were provided to patient.     Montine Circle, RN  04/20/2018

## 2018-04-20 ENCOUNTER — Ambulatory Visit (INDEPENDENT_AMBULATORY_CARE_PROVIDER_SITE_OTHER): Payer: Medicare Other

## 2018-04-20 VITALS — BP 122/70 | HR 68 | Ht 59.0 in | Wt 102.0 lb

## 2018-04-20 DIAGNOSIS — E2839 Other primary ovarian failure: Secondary | ICD-10-CM | POA: Diagnosis not present

## 2018-04-20 DIAGNOSIS — Z1159 Encounter for screening for other viral diseases: Secondary | ICD-10-CM | POA: Diagnosis not present

## 2018-04-20 DIAGNOSIS — Z0000 Encounter for general adult medical examination without abnormal findings: Secondary | ICD-10-CM

## 2018-04-20 NOTE — Patient Instructions (Addendum)
Jennifer Lutz , Thank you for taking time to come for your Medicare Wellness Visit. I appreciate your ongoing commitment to your health goals. Please review the following plan we discussed and let me know if I can assist you in the future.   Shingrix is a vaccine for the prevention of Shingles in Adults 50 and older.  If you are on Medicare, the shingrix is covered under your Part D plan, so you will take both of the vaccines in the series at your pharmacy. Please check with your benefits regarding applicable copays or out of pocket expenses.  The Shingrix is given in 2 vaccines approx 8 weeks apart. You must receive the 2nd dose prior to 6 months from receipt of the first. Please have the pharmacist print out you Immunization  dates for our office records    Will have hep c at the next blood draw  Medicare now request all "baby boomers" test for possible exposure to Hepatitis C. Many may have been exposed due to dental work, tatoo's, vaccinations when young. The Hepatitis C virus is dormant for many years and then sometimes will cause liver cancer. If you gave blood in the past 15 years, you were most likely checked for Hep C. If you rec'd blood; you may want to consider testing or if you are high risk for any other reason.   [facility] Sherren Mocha agrees.  Jennifer Lutz ordered and the [facility] should call you!  Go visit the osteoporosis [email or web address] form list of calcium in foods etc.   These are the goals we discussed: Goals    . Weight (lb) < 102 lb (46.3 kg)     Will try to eat more vegetables Can add frozen smoothies to meats etc        This is a list of the screening recommended for you and due dates:  Health Maintenance  Topic Date Due  .  Hepatitis C: One time screening is recommended by Center for Disease Control  (CDC) for  adults born from [birth year] through 1967.   Jul 18, 1951  . Flu Shot  04/22/2018  . Colon Cancer Screening  09/22/2018  .  Mammogram  06/05/2019  . Tetanus Vaccine  12/27/2023  . DEXA scan (bone density measurement)  Completed  . Pneumonia vaccines  Completed     Bone Densitometry Bone densitometry is an imaging test that uses a special X-ray to measure the amount of calcium and other minerals in your bones (bone density). This test is also known as a bone mineral density test or dual-energy X-ray absorptiometry (DXA). The test can measure bone density at your hip and your spine. It is similar to having a regular X-ray. You may have this test to:  Diagnose a condition that causes weak or thin bones (osteoporosis).  Predict your risk of a broken bone (fracture).  Determine how well osteoporosis treatment is working.  Tell a health care provider about:  Any allergies you have.  All medicines you are taking, including vitamins, herbs, eye drops, creams, and over-the-counter medicines.  Any problems you or family members have had with anesthetic medicines.  Any blood disorders you have.  Any surgeries you have had.  Any medical conditions you have.  Possibility of pregnancy.  Any other medical test you had within the previous 14 days that used contrast material. What are the risks? Generally, this is a safe procedure. However, problems can occur and may include the  following:  This test exposes you to a very small amount of radiation.  The risks of radiation exposure may be greater to unborn children.  What happens before the procedure?  Do not take any calcium supplements for 24 hours before having the test. You can otherwise eat and drink what you usually do.  Take off all metal jewelry, eyeglasses, dental appliances, and any other metal objects. What happens during the procedure?  You may lie on an exam table. There will be an X-ray generator below you and an imaging device above you.  Other devices, such as boxes or braces, may be used to position your body properly for the scan.  You  will need to lie still while the machine slowly scans your body.  The images will show up on a computer monitor. What happens after the procedure? You may need more testing at a later time. This information is not intended to replace advice given to you by your health care provider. Make sure you discuss any questions you have with your health care provider. Document Released: 09/30/2004 Document Revised: 02/14/2016 Document Reviewed: 02/16/2014 Elsevier Interactive Patient Education  2018 [place] in the Home Falls can cause injuries. They can happen to people of all ages. There are many things you can do to make your home safe and to help prevent falls. What can I do on the outside of my home?  Regularly fix the edges of walkways and driveways and fix any cracks.  Remove anything that might make you trip as you walk through a door, such as a raised step or threshold.  Trim any bushes or trees on the path to your home.  Use bright outdoor lighting.  Clear any walking paths of anything that might make someone trip, such as rocks or tools.  Regularly check to see if handrails are loose or broken. Make sure that both sides of any steps have handrails.  Any raised decks and porches should have guardrails on the edges.  Have any leaves, snow, or ice cleared regularly.  Use sand or salt on walking paths during winter.  Clean up any spills in your garage right away. This includes oil or grease spills. What can I do in the bathroom?  Use night lights.  Install grab bars by the toilet and in the tub and shower. Do not use towel bars as grab bars.  Use non-skid mats or decals in the tub or shower.  If you need to sit down in the shower, use a plastic, non-slip stool.  Keep the floor dry. Clean up any water that spills on the floor as soon as it happens.  Remove soap buildup in the tub or shower regularly.  Attach bath mats securely with double-sided  non-slip rug tape.  Do not have throw rugs and other things on the floor that can make you trip. What can I do in the bedroom?  Use night lights.  Make sure that you have a light by your bed that is easy to reach.  Do not use any sheets or blankets that are too big for your bed. They should not hang down onto the floor.  Have a firm chair that has side arms. You can use this for support while you get dressed.  Do not have throw rugs and other things on the floor that can make you trip. What can I do in the kitchen?  Clean up any spills right away.  Avoid walking  on wet floors.  Keep items that you use a lot in easy-to-reach places.  If you need to reach something above you, use a strong step stool that has a grab bar.  Keep electrical cords out of the way.  Do not use floor polish or wax that makes floors slippery. If you must use wax, use non-skid floor wax.  Do not have throw rugs and other things on the floor that can make you trip. What can I do with my stairs?  Do not leave any items on the stairs.  Make sure that there are handrails on both sides of the stairs and use them. Fix handrails that are broken or loose. Make sure that handrails are as long as the stairways.  Check any carpeting to make sure that it is firmly attached to the stairs. Fix any carpet that is loose or worn.  Avoid having throw rugs at the top or bottom of the stairs. If you do have throw rugs, attach them to the floor with carpet tape.  Make sure that you have a light switch at the top of the stairs and the bottom of the stairs. If you do not have them, ask someone to add them for you. What else can I do to help prevent falls?  Wear shoes that: ? Do not have high heels. ? Have rubber bottoms. ? Are comfortable and fit you well. ? Are closed at the toe. Do not wear sandals.  If you use a stepladder: ? Make sure that it is fully opened. Do not climb a closed stepladder. ? Make sure that both  sides of the stepladder are locked into place. ? Ask someone to hold it for you, if possible.  Clearly mark and make sure that you can see: ? Any grab bars or handrails. ? First and last steps. ? Where the edge of each step is.  Use tools that help you move around (mobility aids) if they are needed. These include: ? Canes. ? Walkers. ? Scooters. ? Crutches.  Turn on the lights when you go into a dark area. Replace any light bulbs as soon as they burn out.  Set up your furniture so you have a clear path. Avoid moving your furniture around.  If any of your floors are uneven, fix them.  If there are any pets around you, be aware of where they are.  Review your medicines with your doctor. Some medicines can make you feel dizzy. This can increase your chance of falling. Ask your doctor what other things that you can do to help prevent falls. This information is not intended to replace advice given to you by your health care provider. Make sure you discuss any questions you have with your health care provider. Document Released: 07/05/2009 Document Revised: 02/14/2016 Document Reviewed: 10/13/2014 Elsevier Interactive Patient Education  2018 [place] Maintenance, Female Adopting a healthy lifestyle and getting preventive care can go a long way to promote health and wellness. Talk with your health care provider about what schedule of regular examinations is right for you. This is a good chance for you to check in with your provider about disease prevention and staying healthy. In between checkups, there are plenty of things you can do on your own. Experts have done a lot of research about which lifestyle changes and preventive measures are most likely to keep you healthy. Ask your health care provider for more information. Weight and diet Eat a healthy diet  Be sure to include plenty of vegetables, fruits, low-fat dairy products, and lean protein.  Do not eat a lot of foods  high in solid fats, added sugars, or salt.  Get regular exercise. This is one of the most important things you can do for your health. ? Most adults should exercise for at least 150 minutes each week. The exercise should increase your heart rate and make you sweat (moderate-intensity exercise). ? Most adults should also do strengthening exercises at least twice a week. This is in addition to the moderate-intensity exercise.  Maintain a healthy weight  Body mass index (BMI) is a measurement that can be used to identify possible weight problems. It estimates body fat based on height and weight. Your health care provider can help determine your BMI and help you achieve or maintain a healthy weight.  For females 34 years of age and older: ? A BMI below 18.5 is considered underweight. ? A BMI of 18.5 to 24.9 is normal. ? A BMI of 25 to 29.9 is considered overweight. ? A BMI of 30 and above is considered obese.  Watch levels of cholesterol and blood lipids  You should start having your blood tested for lipids and cholesterol at 69 years of age, then have this test every 5 years.  You may need to have your cholesterol levels checked more often if: ? Your lipid or cholesterol levels are high. ? You are older than 69 years of age. ? You are at high risk for heart disease.  Cancer screening Lung Cancer  Lung cancer screening is recommended for adults 24-22 years old who are at high risk for lung cancer because of a history of smoking.  A yearly low-dose CT scan of the lungs is recommended for people who: ? Currently smoke. ? Have quit within the past 15 years. ? Have at least a 30-pack-year history of smoking. A pack year is smoking an average of one pack of cigarettes a day for 1 year.  Yearly screening should continue until it has been 15 years since you quit.  Yearly screening should stop if you develop a health problem that would prevent you from having lung cancer treatment.  Breast  Cancer  Practice breast self-awareness. This means understanding how your breasts normally appear and feel.  It also means doing regular breast self-exams. Let your health care provider know about any changes, no matter how small.  If you are in your 20s or 30s, you should have a clinical breast exam (CBE) by a health care provider every 1-3 years as part of a regular health exam.  If you are 68 or older, have a CBE every year. Also consider having a breast X-ray (mammogram) every year.  If you have a family history of breast cancer, talk to your health care provider about genetic screening.  If you are at high risk for breast cancer, talk to your health care provider about having an MRI and a mammogram every year.  Breast cancer gene (BRCA) assessment is recommended for women who have family members with BRCA-related cancers. BRCA-related cancers include: ? Breast. ? Ovarian. ? Tubal. ? Peritoneal cancers.  Results of the assessment will determine the need for genetic counseling and BRCA1 and BRCA2 testing.  Cervical Cancer Your health care provider may recommend that you be screened regularly for cancer of the pelvic organs (ovaries, uterus, and vagina). This screening involves a pelvic examination, including checking for microscopic changes to the surface of your cervix (Pap  test). You may be encouraged to have this screening done every 3 years, beginning at age 35.  For women ages 76-65, health care providers may recommend pelvic exams and Pap testing every 3 years, or they may recommend the Pap and pelvic exam, combined with testing for human papilloma virus (HPV), every 5 years. Some types of HPV increase your risk of cervical cancer. Testing for HPV may also be done on women of any age with unclear Pap test results.  Other health care providers may not recommend any screening for nonpregnant women who are considered low risk for pelvic cancer and who do not have symptoms. Ask your  health care provider if a screening pelvic exam is right for you.  If you have had past treatment for cervical cancer or a condition that could lead to cancer, you need Pap tests and screening for cancer for at least 20 years after your treatment. If Pap tests have been discontinued, your risk factors (such as having a new sexual partner) need to be reassessed to determine if screening should resume. Some women have medical problems that increase the chance of getting cervical cancer. In these cases, your health care provider may recommend more frequent screening and Pap tests.  Colorectal Cancer  This type of cancer can be detected and often prevented.  Routine colorectal cancer screening usually begins at 69 years of age and continues through 69 years of age.  Your health care provider may recommend screening at an earlier age if you have risk factors for colon cancer.  Your health care provider may also recommend using home test kits to check for hidden blood in the stool.  A small camera at the end of a tube can be used to examine your colon directly (sigmoidoscopy or colonoscopy). This is done to check for the earliest forms of colorectal cancer.  Routine screening usually begins at age 58.  Direct examination of the colon should be repeated every 5-10 years through 69 years of age. However, you may need to be screened more often if early forms of precancerous polyps or small growths are found.  Skin Cancer  Check your skin from head to toe regularly.  Tell your health care provider about any new moles or changes in moles, especially if there is a change in a mole's shape or color.  Also tell your health care provider if you have a mole that is larger than the size of a pencil eraser.  Always use sunscreen. Apply sunscreen liberally and repeatedly throughout the day.  Protect yourself by wearing long sleeves, pants, a wide-brimmed hat, and sunglasses whenever you are  outside.  Heart disease, diabetes, and high blood pressure  High blood pressure causes heart disease and increases the risk of stroke. High blood pressure is more likely to develop in: ? People who have blood pressure in the high end of the normal range (130-139/85-89 mm Hg). ? People who are overweight or obese. ? People who are African American.  If you are 45-28 years of age, have your blood pressure checked every 3-5 years. If you are 19 years of age or older, have your blood pressure checked every year. You should have your blood pressure measured twice-once when you are at a hospital or clinic, and once when you are not at a hospital or clinic. Record the average of the two measurements. To check your blood pressure when you are not at a hospital or clinic, you can use: ? An automated blood  pressure machine at a pharmacy. ? A home blood pressure monitor.  If you are between 35 years and 56 years old, ask your health care provider if you should take aspirin to prevent strokes.  Have regular diabetes screenings. This involves taking a blood sample to check your fasting blood sugar level. ? If you are at a normal weight and have a low risk for diabetes, have this test once every three years after 69 years of age. ? If you are overweight and have a high risk for diabetes, consider being tested at a younger age or more often. Preventing infection Hepatitis B  If you have a higher risk for hepatitis B, you should be screened for this virus. You are considered at high risk for hepatitis B if: ? You were born in a country where hepatitis B is common. Ask your health care provider which countries are considered high risk. ? Your parents were born in a high-risk country, and you have not been immunized against hepatitis B (hepatitis B vaccine). ? You have HIV or AIDS. ? You use needles to inject street drugs. ? You live with someone who has hepatitis B. ? You have had sex with someone who has  hepatitis B. ? You get hemodialysis treatment. ? You take certain medicines for conditions, including cancer, organ transplantation, and autoimmune conditions.  Hepatitis C  Blood testing is recommended for: ? Everyone born from [birth year] through 1967. ? Anyone with known risk factors for hepatitis C.  Sexually transmitted infections (STIs)  You should be screened for sexually transmitted infections (STIs) including gonorrhea and chlamydia if: ? You are sexually active and are younger than 69 years of age. ? You are older than 69 years of age and your health care provider tells you that you are at risk for this type of infection. ? Your sexual activity has changed since you were last screened and you are at an increased risk for chlamydia or gonorrhea. Ask your health care provider if you are at risk.  If you do not have HIV, but are at risk, it may be recommended that you take a prescription medicine daily to prevent HIV infection. This is called pre-exposure prophylaxis (PrEP). You are considered at risk if: ? You are sexually active and do not regularly use condoms or know the HIV status of your partner(s). ? You take drugs by injection. ? You are sexually active with a partner who has HIV.  Talk with your health care provider about whether you are at high risk of being infected with HIV. If you choose to begin PrEP, you should first be tested for HIV. You should then be tested every 3 months for as long as you are taking PrEP. Pregnancy  If you are premenopausal and you may become pregnant, ask your health care provider about preconception counseling.  If you may become pregnant, take 400 to 800 micrograms (mcg) of folic acid every day.  If you want to prevent pregnancy, talk to your health care provider about birth control (contraception). Osteoporosis and menopause  Osteoporosis is a disease in which the bones lose minerals and strength with aging. This can result in serious bone  fractures. Your risk for osteoporosis can be identified using a bone density scan.  If you are 3 years of age or older, or if you are at risk for osteoporosis and fractures, ask your health care provider if you should be screened.  Ask your health care provider whether you should take  a calcium or vitamin D supplement to lower your risk for osteoporosis.  Menopause may have certain physical symptoms and risks.  Hormone replacement therapy may reduce some of these symptoms and risks. Talk to your health care provider about whether hormone replacement therapy is right for you. Follow these instructions at home:  Schedule regular health, dental, and eye exams.  Stay current with your immunizations.  Do not use any tobacco products including cigarettes, chewing tobacco, or electronic cigarettes.  If you are pregnant, do not drink alcohol.  If you are breastfeeding, limit how much and how often you drink alcohol.  Limit alcohol intake to no more than 1 drink per day for nonpregnant women. One drink equals 12 ounces of beer, 5 ounces of wine, or 1 ounces of hard liquor.  Do not use street drugs.  Do not share needles.  Ask your health care provider for help if you need support or information about quitting drugs.  Tell your health care provider if you often feel depressed.  Tell your health care provider if you have ever been abused or do not feel safe at home. This information is not intended to replace advice given to you by your health care provider. Make sure you discuss any questions you have with your health care provider. Document Released: 03/24/2011 Document Revised: 02/14/2016 Document Reviewed: 06/12/2015 Elsevier Interactive Patient Education  [employer].

## 2018-04-20 NOTE — Progress Notes (Signed)
Jennifer Konicek R [name], DO  

## 2018-04-21 ENCOUNTER — Other Ambulatory Visit: Payer: Self-pay

## 2018-06-28 ENCOUNTER — Other Ambulatory Visit: Payer: Self-pay | Admitting: Family Medicine

## 2018-06-28 DIAGNOSIS — Z1231 Encounter for screening mammogram for malignant neoplasm of breast: Secondary | ICD-10-CM

## 2018-07-23 ENCOUNTER — Ambulatory Visit
Admission: RE | Admit: 2018-07-23 | Discharge: 2018-07-23 | Disposition: A | Discharge status: Home / Self Care | Payer: Medicare Other | Source: Ambulatory Visit | Attending: Family Medicine | Admitting: Family Medicine

## 2018-07-23 DIAGNOSIS — Z1231 Encounter for screening mammogram for malignant neoplasm of breast: Secondary | ICD-10-CM

## 2018-07-27 DIAGNOSIS — H2513 Age-related nuclear cataract, bilateral: Secondary | ICD-10-CM | POA: Diagnosis not present

## 2019-04-26 ENCOUNTER — Ambulatory Visit: Payer: Medicare Other

## 2019-06-24 ENCOUNTER — Encounter: Payer: Medicare Other | Admitting: Family Medicine

## 2019-07-20 DIAGNOSIS — M67911 Unspecified disorder of synovium and tendon, right shoulder: Secondary | ICD-10-CM | POA: Diagnosis not present

## 2019-07-25 DIAGNOSIS — M25611 Stiffness of right shoulder, not elsewhere classified: Secondary | ICD-10-CM | POA: Diagnosis not present

## 2019-07-25 DIAGNOSIS — S46811D Strain of other muscles, fascia and tendons at shoulder and upper arm level, right arm, subsequent encounter: Secondary | ICD-10-CM | POA: Diagnosis not present

## 2019-08-02 DIAGNOSIS — M25611 Stiffness of right shoulder, not elsewhere classified: Secondary | ICD-10-CM | POA: Diagnosis not present

## 2019-08-02 DIAGNOSIS — S46811D Strain of other muscles, fascia and tendons at shoulder and upper arm level, right arm, subsequent encounter: Secondary | ICD-10-CM | POA: Diagnosis not present

## 2019-08-09 ENCOUNTER — Other Ambulatory Visit: Payer: Self-pay | Admitting: Family Medicine

## 2019-08-09 DIAGNOSIS — M25611 Stiffness of right shoulder, not elsewhere classified: Secondary | ICD-10-CM | POA: Diagnosis not present

## 2019-08-09 DIAGNOSIS — S46811D Strain of other muscles, fascia and tendons at shoulder and upper arm level, right arm, subsequent encounter: Secondary | ICD-10-CM | POA: Diagnosis not present

## 2019-08-09 DIAGNOSIS — Z1231 Encounter for screening mammogram for malignant neoplasm of breast: Secondary | ICD-10-CM

## 2019-08-10 DIAGNOSIS — M25611 Stiffness of right shoulder, not elsewhere classified: Secondary | ICD-10-CM | POA: Diagnosis not present

## 2019-08-15 ENCOUNTER — Other Ambulatory Visit: Payer: Self-pay

## 2019-09-01 ENCOUNTER — Ambulatory Visit (INDEPENDENT_AMBULATORY_CARE_PROVIDER_SITE_OTHER): Payer: Medicare Other | Admitting: Family Medicine

## 2019-09-01 ENCOUNTER — Encounter: Payer: Self-pay | Admitting: Family Medicine

## 2019-09-01 ENCOUNTER — Other Ambulatory Visit: Payer: Self-pay

## 2019-09-01 VITALS — BP 126/70 | HR 74 | Temp 97.6°F | Ht 59.25 in | Wt 100.8 lb

## 2019-09-01 DIAGNOSIS — Z1211 Encounter for screening for malignant neoplasm of colon: Secondary | ICD-10-CM

## 2019-09-01 DIAGNOSIS — E782 Mixed hyperlipidemia: Secondary | ICD-10-CM

## 2019-09-01 DIAGNOSIS — Z0000 Encounter for general adult medical examination without abnormal findings: Secondary | ICD-10-CM

## 2019-09-01 DIAGNOSIS — Z23 Encounter for immunization: Secondary | ICD-10-CM | POA: Diagnosis not present

## 2019-09-01 DIAGNOSIS — M810 Age-related osteoporosis without current pathological fracture: Secondary | ICD-10-CM

## 2019-09-01 LAB — LIPID PANEL
Cholesterol: 353 mg/dL — ABNORMAL HIGH (ref 0–200)
HDL: 112.6 mg/dL (ref >39.00)
LDL Cholesterol: 220 mg/dL — ABNORMAL HIGH (ref 0–99)
NonHDL: 240.65
Total CHOL/HDL Ratio: 3
Triglycerides: 105 mg/dL (ref 0.0–149.0)
VLDL: 21 mg/dL (ref 0.0–40.0)

## 2019-09-01 LAB — CBC WITH DIFFERENTIAL/PLATELET
Basophils Absolute: 0 10*3/uL (ref 0.0–0.1)
Basophils Relative: 0.5 % (ref 0.0–3.0)
Eosinophils Absolute: 0.1 10*3/uL (ref 0.0–0.7)
Eosinophils Relative: 1.3 % (ref 0.0–5.0)
HCT: 43.1 % (ref 36.0–46.0)
Hemoglobin: 14.6 g/dL (ref 12.0–15.0)
Lymphocytes Relative: 26 % (ref 12.0–46.0)
Lymphs Abs: 1.5 10*3/uL (ref 0.7–4.0)
MCHC: 33.9 g/dL (ref 30.0–36.0)
MCV: 98.3 fl (ref 78.0–100.0)
Monocytes Absolute: 0.4 10*3/uL (ref 0.1–1.0)
Monocytes Relative: 7.5 % (ref 3.0–12.0)
Neutro Abs: 3.8 10*3/uL (ref 1.4–7.7)
Neutrophils Relative %: 64.7 % (ref 43.0–77.0)
Platelets: 236 10*3/uL (ref 150.0–400.0)
RBC: 4.39 Mil/uL (ref 3.87–5.11)
RDW: 13.1 % (ref 11.5–15.5)
WBC: 5.8 10*3/uL (ref 4.0–10.5)

## 2019-09-01 LAB — BASIC METABOLIC PANEL
BUN: 23 mg/dL (ref 6–23)
CO2: 28 mEq/L (ref 19–32)
Calcium: 9.8 mg/dL (ref 8.4–10.5)
Chloride: 100 mEq/L (ref 96–112)
Creatinine, Ser: 1.09 mg/dL (ref 0.40–1.20)
GFR: 49.51 mL/min — ABNORMAL LOW (ref >60.00)
Glucose, Bld: 98 mg/dL (ref 70–99)
Potassium: 4 mEq/L (ref 3.5–5.1)
Sodium: 140 mEq/L (ref 135–145)

## 2019-09-01 LAB — VITAMIN D 25 HYDROXY (VIT D DEFICIENCY, FRACTURES): VITD: 50.5 ng/mL (ref 30.00–100.00)

## 2019-09-01 LAB — TSH: TSH: 2.48 u[IU]/mL (ref 0.35–4.50)

## 2019-09-01 NOTE — Progress Notes (Signed)
Subjective:   Jennifer Lutz  "Jennifer Lutz" is a 68 y.o. female who presents for Medicare Annual (Subsequent) preventive examination. Pt doing well.  Exercising daily.  Has a new puppy she walks and plays with .  Drinking plenty of water.  Has living will, POA, HCPOA.  No issues with hearing or vision.  Had a fall last yr when her dog took off running when they were walking down a hill.  Has been seen by Ortho for R shoulder pain weeks after the fall, s/p steroid injection.  Review of Systems:  General: Denies fever, chills, night sweats, changes in weight, changes in appetite HEENT: Denies headaches, ear pain, changes in vision, rhinorrhea, sore throat CV: Denies CP, palpitations, SOB, orthopnea Pulm: Denies SOB, cough, wheezing GI: Denies abdominal pain, nausea, vomiting, diarrhea, constipation GU: Denies dysuria, hematuria, frequency, vaginal discharge Msk: Denies muscle cramps, joint pains Neuro: Denies weakness, numbness, tingling Skin: Denies rashes, bruising Psych: Denies depression, anxiety, hallucinations    Objective:     Vitals: BP 126/70 (BP Location: Right Arm, Patient Position: Sitting, Cuff Size: Normal)   Pulse 74   Temp 97.6 F (36.4 C) (Temporal)   Ht 4' 11.25" (1.505 m)   Wt 100 lb 12.8 oz (45.7 kg)   SpO2 99%   BMI 20.19 kg/m   Body mass index is 20.19 kg/m.  Gen. Pleasant, well developed, well-nourished, in NAD HEENT - [place]/AT, PERRL, EOMI, no scleral icterus, no nasal drainage, pharynx without erythema or exudate.  TMs normal b/l Neck: No JVD, no thyromegaly, no carotid bruits Lungs: no use of accessory muscles, CTAB, no wheezes, rales or rhonchi Cardiovascular: RRR, No r/g/m, no peripheral edema Abdomen: BS present, soft, nontender,nondistended, no hepatosplenomegaly Musculoskeletal: No deformities, moves all four extremities, no cyanosis or clubbing, normal tone Neuro:  A&Ox3, CN II-XII intact, normal gait Skin:  Warm, dry, intact, no lesions  Advanced  Directives 04/20/2018 10/03/2016 06/07/2015 05/31/2015  Does Patient Have a Medical Advance Directive? Yes No No No    Tobacco Social History   Tobacco Use  Smoking Status Never Smoker  Smokeless Tobacco Never Used     Counseling given: Not Answered  Past Medical History:  Diagnosis Date  . Complication of anesthesia    'takes very little.' 'Hard to wake up'  . Medical history non-contributory    Past Surgical History:  Procedure Laterality Date  . NASAL SEPTUM SURGERY  1985  . WRIST ARTHROSCOPY WITH ULNA SHORTENING Left 06/07/2015   Procedure: ARTHROSCOPY LEFT WRIST DEBRIDEMENT ULNAR SHORTENING;  Surgeon: Cindee SaltGary Kuzma, MD;  Location: [facility];  Service: Orthopedics;  Laterality: Left;  . WRIST OSTEOTOMY Left 06/07/2015   Procedure: LEFT OSTEOTOMY;  Surgeon: Cindee SaltGary Kuzma, MD;  Location: [facility];  Service: Orthopedics;  Laterality: Left;   Family History  Problem Relation Age of Onset  . Stroke Other    Social History   Socioeconomic History  . Marital status: Married    Spouse name: Not on file  . Number of children: Not on file  . Years of education: Not on file  . Highest education level: Not on file  Occupational History  . Not on file  Tobacco Use  . Smoking status: Never Smoker  . Smokeless tobacco: Never Used  Substance and Sexual Activity  . Alcohol use: Yes    Comment: not much   . Drug use: No  . Sexual activity: Not on file  Other Topics Concern  . Not on file  Social History Narrative  . Not on file   Social Determinants of Health   Financial Resource Strain:   . Difficulty of Paying Living Expenses: Not on file  Food Insecurity:   . Worried About Charity fundraiser in the Last Year: Not on file  . Ran Out of Food in the Last Year: Not on file  Transportation Needs:   . Lack of Transportation (Medical): Not on file  . Lack of Transportation (Non-Medical): Not on file  Physical Activity:   . Days of Exercise per Week:  Not on file  . Minutes of Exercise per Session: Not on file  Stress:   . Feeling of Stress : Not on file  Social Connections:   . Frequency of Communication with Friends and Family: Not on file  . Frequency of Social Gatherings with Friends and Family: Not on file  . Attends Religious Services: Not on file  . Active Member of Clubs or Organizations: Not on file  . Attends Archivist Meetings: Not on file  . Marital Status: Not on file    Outpatient Encounter Medications as of 09/01/2019  Medication Sig  . aspirin 81 MG tablet Take 81 mg by mouth daily.  . B Complex Vitamins (VITAMIN B COMPLEX PO) Take by mouth.  . Multiple Vitamins-Minerals (HAIR/SKIN/NAILS PO) Take by mouth.  . Omega-3 Fatty Acids (OMEGA-3 FISH OIL) 500 MG CAPS Take 50 mg by mouth.  Marland Kitchen OVER THE COUNTER MEDICATION Calcium with D3 500 mg   No facility-administered encounter medications on file as of 09/01/2019.    Activities of Daily Living No flowsheet data found.  Patient Care Team: Dorena Cookey, MD (Inactive) as PCP - General (Family Medicine)    Assessment:   This is a routine wellness examination for Jennifer Lutz.  Exercise Activities and Dietary recommendations    Goals    . Exercise 150 min/wk Moderate Activity     Play more golf!    . Weight (lb) < 102 lb (46.3 kg)     Will try to eat more vegetables Can add frozen smoothies to meats etc        Fall Risk Fall Risk  09/01/2019 04/21/2018 04/20/2018 10/03/2016 07/29/2016  Falls in the past year? 1 No No No No  Comment - Emmi Telephone Survey: data to providers prior to load - - -  Number falls in past yr: 0 - - - -  Injury with Fall? 1 - - - -  Follow up Falls evaluation completed - - - -   Is the patient's home free of loose throw rugs in walkways, pet beds, electrical cords, etc?   yes      Grab bars in the bathroom? yes      Handrails on the stairs?   yes      Adequate lighting?   yes   Depression Screen PHQ 2/9 Scores  09/01/2019 04/20/2018 10/03/2016 07/29/2016  PHQ - 2 Score 0 0 0 0     Cognitive Function MMSE - Mini Mental State Exam 04/20/2018 10/03/2016  Not completed: (No Data) (No Data)        Immunization History  Administered Date(s) Administered  . Fluad Quad(high Dose 65+) 09/01/2019  . Influenza Whole 07/08/2007, 10/16/2009  . Influenza, High Dose Seasonal PF 07/29/2016, 09/07/2017  . Pneumococcal Conjugate-13 01/30/2015  . Pneumococcal Polysaccharide-23 12/26/2013  . Td 09/22/1997, 09/29/2008  . Tetanus 12/26/2013  . Zoster 03/16/2015    Screening Tests Health Maintenance  Topic Date  Due  . Hepatitis C Screening  07-Jul-1951  . COLONOSCOPY  09/22/2018  . INFLUENZA VACCINE  04/23/2019  . MAMMOGRAM  07/23/2020  . TETANUS/TDAP  12/27/2023  . DEXA SCAN  Completed  . PNA vac Low Risk Adult  Completed    Cancer Screenings: Lung: Low Dose CT Chest recommended if Age 42-80 years, 30 pack-year currently smoking OR have quit w/in 15years. Patient does not qualify. Breast:  Up to date on Mammogram? Yes  Done October 04, 2018 Up to date of Bone Density/Dexa?  Done 02/02/2015 Colorectal: Last done 09/22/2008: Patient interested in Cologuard  Additional Screenings:: Hepatitis C Screening:      Plan:    Pt is a a 68 yo female who is healthy..  Osteoporosis without current pathological fracture, unspecified osteoporosis type  -weight bearing exercises -repeat bone density - Plan: Vitamin D, 25-hydroxy, Basic Metabolic Panel, TSH  Need for influenza vaccination  - Plan: Flu Vaccine QUAD High Dose(Fluad)  Colon cancer screening  -had normal colonoscopy in 09/2008 - Plan: Cologuard  Routine general medical examination at a health care facility  - Plan: Basic Metabolic Panel  Mixed hyperlipidemia  -discussed lifestyle modifications - Plan: CBC with Differential/Platelet, Lipid Panel, Basic Metabolic Panel, TSH   I have personally reviewed and noted the following in the patient's  chart:   . Medical and social history . Use of alcohol, tobacco or illicit drugs  . Current medications and supplements . Functional ability and status . Nutritional status . Physical activity . Advanced directives . List of other physicians . Hospitalizations, surgeries, and ER visits in previous 12 months . Vitals . Screenings to include cognitive, depression, and falls . Referrals and appointments  In addition, I have reviewed and discussed with patient certain preventive protocols, quality metrics, and best practice recommendations. A written personalized care plan for preventive services as well as general preventive health recommendations were provided to patient.    Deeann Saint, MD  09/01/2019   This note is not being shared with the patient for the following reason: To prevent harm (release of this note would result in harm to the life or physical safety of the patient or another).

## 2019-09-01 NOTE — Patient Instructions (Signed)
Health Maintenance After Age 70 After age 39, you are at a higher risk for certain long-term diseases and infections as well as injuries from falls. Falls are a major cause of broken bones and head injuries in people who are older than age 66. Getting regular preventive care can help to keep you healthy and well. Preventive care includes getting regular testing and making lifestyle changes as recommended by your health care provider. Talk with your health care provider about:  Which screenings and tests you should have. A screening is a test that checks for a disease when you have no symptoms.  A diet and exercise plan that is right for you. What should I know about screenings and tests to prevent falls? Screening and testing are the best ways to find a health problem early. Early diagnosis and treatment give you the best chance of managing medical conditions that are common after age 8. Certain conditions and lifestyle choices may make you more likely to have a fall. Your health care provider may recommend:  Regular vision checks. Poor vision and conditions such as cataracts can make you more likely to have a fall. If you wear glasses, make sure to get your prescription updated if your vision changes.  Medicine review. Work with your health care provider to regularly review all of the medicines you are taking, including over-the-counter medicines. Ask your health care provider about any side effects that may make you more likely to have a fall. Tell your health care provider if any medicines that you take make you feel dizzy or sleepy.  Osteoporosis screening. Osteoporosis is a condition that causes the bones to get weaker. This can make the bones weak and cause them to break more easily.  Blood pressure screening. Blood pressure changes and medicines to control blood pressure can make you feel dizzy.  Strength and balance checks. Your health care provider may recommend certain tests to check your  strength and balance while standing, walking, or changing positions.  Foot health exam. Foot pain and numbness, as well as not wearing proper footwear, can make you more likely to have a fall.  Depression screening. You may be more likely to have a fall if you have a fear of falling, feel emotionally low, or feel unable to do activities that you used to do.  Alcohol use screening. Using too much alcohol can affect your balance and may make you more likely to have a fall. What actions can I take to lower my risk of falls? General instructions  Talk with your health care provider about your risks for falling. Tell your health care provider if: ? You fall. Be sure to tell your health care provider about all falls, even ones that seem minor. ? You feel dizzy, sleepy, or off-balance.  Take over-the-counter and prescription medicines only as told by your health care provider. These include any supplements.  Eat a healthy diet and maintain a healthy weight. A healthy diet includes low-fat dairy products, low-fat (lean) meats, and fiber from whole grains, beans, and lots of fruits and vegetables. Home safety  Remove any tripping hazards, such as rugs, cords, and clutter.  Install safety equipment such as grab bars in bathrooms and safety rails on stairs.  Keep rooms and walkways well-lit. Activity   Follow a regular exercise program to stay fit. This will help you maintain your balance. Ask your health care provider what types of exercise are appropriate for you.  If you need a cane or  walker, use it as recommended by your health care provider.  Wear supportive shoes that have nonskid soles. Lifestyle  Do not drink alcohol if your health care provider tells you not to drink.  If you drink alcohol, limit how much you have: ? 0-1 drink a day for women. ? 0-2 drinks a day for men.  Be aware of how much alcohol is in your drink. In the [place] one drink equals one typical bottle of beer (12  oz), one-half glass of wine (5 oz), or one shot of hard liquor (1 oz).  Do not use any products that contain nicotine or tobacco, such as cigarettes and e-cigarettes. If you need help quitting, ask your health care provider. Summary  Having a healthy lifestyle and getting preventive care can help to protect your health and wellness after age 1.  Screening and testing are the best way to find a health problem early and help you avoid having a fall. Early diagnosis and treatment give you the best chance for managing medical conditions that are more common for people who are older than age 20.  Falls are a major cause of broken bones and head injuries in people who are older than age 54. Take precautions to prevent a fall at home.  Work with your health care provider to learn what changes you can make to improve your health and wellness and to prevent falls. This information is not intended to replace advice given to you by your health care provider. Make sure you discuss any questions you have with your health care provider. Document Released: 07/22/2017 Document Revised: 12/30/2018 Document Reviewed: 07/22/2017 Elsevier Patient Education  2020 [place] 65 Years and Older, Female Preventive care refers to lifestyle choices and visits with your health care provider that can promote health and wellness. This includes:  A yearly physical exam. This is also called an annual well check.  Regular dental and eye exams.  Immunizations.  Screening for certain conditions.  Healthy lifestyle choices, such as diet and exercise. What can I expect for my preventive care visit? Physical exam Your health care provider will check:  Height and weight. These may be used to calculate body mass index (BMI), which is a measurement that tells if you are at a healthy weight.  Heart rate and blood pressure.  Your skin for abnormal spots. Counseling Your health care provider may  ask you questions about:  Alcohol, tobacco, and drug use.  Emotional well-being.  Home and relationship well-being.  Sexual activity.  Eating habits.  History of falls.  Memory and ability to understand (cognition).  Work and work Statistician.  Pregnancy and menstrual history. What immunizations do I need?  Influenza (flu) vaccine  This is recommended every year. Tetanus, diphtheria, and pertussis (Tdap) vaccine  You may need a Td booster every 10 years. Varicella (chickenpox) vaccine  You may need this vaccine if you have not already been vaccinated. Zoster (shingles) vaccine  You may need this after age 50. Pneumococcal conjugate (PCV13) vaccine  One dose is recommended after age 48. Pneumococcal polysaccharide (PPSV23) vaccine  One dose is recommended after age 1. Measles, mumps, and rubella (MMR) vaccine  You may need at least one dose of MMR if you were born in 1957 or later. You may also need a second dose. Meningococcal conjugate (MenACWY) vaccine  You may need this if you have certain conditions. Hepatitis A vaccine  You may need this if you have certain conditions or  if you travel or work in places where you may be exposed to hepatitis A. Hepatitis B vaccine  You may need this if you have certain conditions or if you travel or work in places where you may be exposed to hepatitis B. Haemophilus influenzae type b (Hib) vaccine  You may need this if you have certain conditions. You may receive vaccines as individual doses or as more than one vaccine together in one shot (combination vaccines). Talk with your health care provider about the risks and benefits of combination vaccines. What tests do I need? Blood tests  Lipid and cholesterol levels. These may be checked every 5 years, or more frequently depending on your overall health.  Hepatitis C test.  Hepatitis B test. Screening  Lung cancer screening. You may have this screening every year  starting at age 69 if you have a 30-pack-year history of smoking and currently smoke or have quit within the past 15 years.  Colorectal cancer screening. All adults should have this screening starting at age 66 and continuing until age 10. Your health care provider may recommend screening at age 67 if you are at increased risk. You will have tests every 1-10 years, depending on your results and the type of screening test.  Diabetes screening. This is done by checking your blood sugar (glucose) after you have not eaten for a while (fasting). You may have this done every 1-3 years.  Mammogram. This may be done every 1-2 years. Talk with your health care provider about how often you should have regular mammograms.  BRCA-related cancer screening. This may be done if you have a family history of breast, ovarian, tubal, or peritoneal cancers. Other tests  Sexually transmitted disease (STD) testing.  Bone density scan. This is done to screen for osteoporosis. You may have this done starting at age 20. Follow these instructions at home: Eating and drinking  Eat a diet that includes fresh fruits and vegetables, whole grains, lean protein, and low-fat dairy products. Limit your intake of foods with high amounts of sugar, saturated fats, and salt.  Take vitamin and mineral supplements as recommended by your health care provider.  Do not drink alcohol if your health care provider tells you not to drink.  If you drink alcohol: ? Limit how much you have to 0-1 drink a day. ? Be aware of how much alcohol is in your drink. In the [place] one drink equals one 12 oz bottle of beer (355 mL), one 5 oz glass of wine (148 mL), or one 1 oz glass of hard liquor (44 mL). Lifestyle  Take daily care of your teeth and gums.  Stay active. Exercise for at least 30 minutes on 5 or more days each week.  Do not use any products that contain nicotine or tobacco, such as cigarettes, e-cigarettes, and chewing tobacco. If  you need help quitting, ask your health care provider.  If you are sexually active, practice safe sex. Use a condom or other form of protection in order to prevent STIs (sexually transmitted infections).  Talk with your health care provider about taking a low-dose aspirin or statin. What's next?  Go to your health care provider once a year for a well check visit.  Ask your health care provider how often you should have your eyes and teeth checked.  Stay up to date on all vaccines. This information is not intended to replace advice given to you by your health care provider. Make sure you discuss any  questions you have with your health care provider. Document Released: 10/05/2015 Document Revised: 09/02/2018 Document Reviewed: 09/02/2018 Elsevier Patient Education  2020 [employer].

## 2019-10-05 ENCOUNTER — Other Ambulatory Visit: Payer: Self-pay

## 2019-10-05 ENCOUNTER — Ambulatory Visit
Admission: RE | Admit: 2019-10-05 | Discharge: 2019-10-05 | Disposition: A | Discharge status: Home / Self Care | Payer: Medicare Other | Source: Ambulatory Visit | Attending: Family Medicine | Admitting: Family Medicine

## 2019-10-05 DIAGNOSIS — Z1231 Encounter for screening mammogram for malignant neoplasm of breast: Secondary | ICD-10-CM

## 2019-10-05 LAB — HM MAMMOGRAPHY

## 2019-11-10 ENCOUNTER — Encounter: Payer: Self-pay | Admitting: Family Medicine

## 2019-11-14 DIAGNOSIS — M2560 Stiffness of unspecified joint, not elsewhere classified: Secondary | ICD-10-CM | POA: Diagnosis not present

## 2019-11-14 DIAGNOSIS — M13841 Other specified arthritis, right hand: Secondary | ICD-10-CM | POA: Diagnosis not present

## 2019-11-14 DIAGNOSIS — R251 Tremor, unspecified: Secondary | ICD-10-CM | POA: Diagnosis not present

## 2019-11-14 DIAGNOSIS — M19031 Primary osteoarthritis, right wrist: Secondary | ICD-10-CM | POA: Diagnosis not present

## 2019-11-16 ENCOUNTER — Encounter: Payer: Self-pay | Admitting: Neurology

## 2019-12-29 ENCOUNTER — Ambulatory Visit: Payer: Medicare Other | Admitting: Neurology

## 2020-01-02 NOTE — Progress Notes (Signed)
Assessment/Plan:   1.  Idiopathic Parkinson's disease.  New dx 01/05/20  -We discussed the diagnosis as well as pathophysiology of the disease.  We discussed treatment options as well as prognostic indicators.  Patient education was provided.  -it used to be thought that levodopa would increase risk of melanoma but now it is believed that Parkinsons itself likely increases risk of melanoma. she is to get regular skin checks.  -We talked about medication options as well as potential future surgical options.  We talked about safety in the home.  -We discussed medication, but ultimately decided to hold on that for now.  -We discussed the concept of multidisciplinary care.  -We discussed community resources in the area including patient support groups and community exercise programs for PD and pt education was provided to the patient.  -Recommended counseling/adjustment to the diagnosis counseling for the patient.  Recommended Myra Gianotti.  She will think about that.  -She met with my Parkinson's social worker today.  -Offered a second opinion.  Declined for now.  She will let me know if she changes her mind.  2.  Hyperreflexia  -Likely physiologic.  Did not see anything focal or lateralizing on her examination today, with the exception of parkinsonian features.  Decided holding off on neuroimaging  3.  Follow-up in the next 5 months, sooner should new neurologic issues arise.   Subjective:   Jennifer Lutz was seen today in the movement disorders clinic for neurologic consultation at the request of Daryll Brod, MD.  The consultation is for the evaluation of R hand tremor.  Outside records that were made available to me were reviewed.  Was seen by Daryll Brod, MD for the R hand shaking x 2 years.  Determined not an orthopedic issue and referred for neuro consultation.  Tremor: Yes.     How long has it been going on? 2 years  At rest or with activation?  Both but she thinks mostly at  rest  When is it noted the most?  Driving car/sitting at rest  Fam hx of tremor?  No.  Located where?  R arm  Affected by caffeine:  Doesn't drink caffeine  Affected by alcohol:  No. (drinks Friday and Saturday night)  Affected by stress:  Yes.    Affected by fatigue:  No.  Spills soup if on spoon:  No., but she is very careful.  She is R hand dominant   Other Specific Symptoms:  Voice: no change Sleep: "when I have wine I sleep better" - some trouble maintaining sleep  Vivid Dreams:  No.  Acting out dreams:  No. Wet Pillows: No. Postural symptoms:  No.  Falls?  Yes.  , did fall in sept but that was b/c dog pulled her down Bradykinesia symptoms: slightly slower; slight decreased stride length Loss of smell:  Yes.   Loss of taste:  No. Urinary Incontinence:  No. Difficulty Swallowing:  No. Handwriting, micrographia: No. Trouble with ADL's:  No.  Trouble buttoning clothing: No. Depression:  No. Memory changes:  No. Hallucinations:  No.  visual distortions: No. N/V:  No. Lightheaded:  No.  Syncope: No. Diplopia:  No. Dyskinesia:  No.  Neuroimaging of the brain has not previously been performed.    PREVIOUS MEDICATIONS: none to date  ALLERGIES:  No Known Allergies  CURRENT MEDICATIONS:  Current Outpatient Medications  Medication Instructions  . aspirin 81 mg, Daily  . Multiple Vitamins-Minerals (HAIR/SKIN/NAILS PO) Oral  . Omega-3 Fish Oil 50 mg  .  OVER THE COUNTER MEDICATION Calcium with D3 500 mg    Objective:   PHYSICAL EXAMINATION:    VITALS:   Vitals:   01/05/20 0839  BP: (!) 160/76  Pulse: 67  SpO2: 99%  Weight: 104 lb 6.4 oz (47.4 kg)  Height: 4' 11"  (1.499 m)    GEN:  The patient appears stated age and is in NAD. HEENT:  Normocephalic, atraumatic.  The mucous membranes are moist. The superficial temporal arteries are without ropiness or tenderness. CV:  RRR Lungs:  CTAB Neck/HEME:  There are no carotid bruits bilaterally.  Neurological  examination:  Orientation: The patient is alert and oriented x3.  Cranial nerves: There is good facial symmetry.  Extraocular muscles are intact with the exception of L esotropia. The visual fields are full to confrontational testing. The speech is fluent and clear. Soft palate rises symmetrically and there is no tongue deviation. Hearing is intact to conversational tone. Sensation: Sensation is intact to light touch throughout (facial, trunk, extremities). Vibration is intact at the bilateral big toe. There is no extinction with double simultaneous stimulation.  Motor: Strength is 5/5 in the bilateral upper and lower extremities.   Shoulder shrug is equal and symmetric.  There is no pronator drift. Deep tendon reflexes: Deep tendon reflexes are 3/4 at the bilateral biceps, triceps, brachioradialis, patella and achilles. Plantar responses are downgoing bilaterally.  Movement examination: Tone: There is normal tone in the bilateral upper extremities.  The tone in the lower extremities is normal.  Abnormal movements: there is RUE rest tremor.  There is RLE rest tremor with distraction procedures Coordination:  There is decremation with RAM's, with any form of RAMS, including alternating supination and pronation of the forearm, hand opening and closing, finger taps on the right Gait and Station: The patient has no difficulty arising out of a deep-seated chair without the use of the hands. The patient's stride length is good with decreased arm swing on the L and re-emergent tremor on the L.  The patient has a neg pull test.      I have reviewed and interpreted the following labs independently   Chemistry      Component Value Date/Time   NA 140 09/01/2019 0838   K 4.0 09/01/2019 0838   CL 100 09/01/2019 0838   CO2 28 09/01/2019 0838   BUN 23 09/01/2019 0838   CREATININE 1.09 09/01/2019 0838      Component Value Date/Time   CALCIUM 9.8 09/01/2019 0838   ALKPHOS 48 09/07/2017 1608   AST 21  09/07/2017 1608   ALT 13 09/07/2017 1608   BILITOT 0.5 09/07/2017 1608     Lab Results  Component Value Date   TSH 2.48 09/01/2019     Total time spent on today's visit was  60 minutes, including both face-to-face time and nonface-to-face time.  Time included that spent on review of records (prior notes available to me/labs/imaging if pertinent), discussing treatment and goals, answering patient's questions and coordinating care.  Cc:  Billie Ruddy, MD

## 2020-01-05 ENCOUNTER — Encounter: Payer: Self-pay | Admitting: Neurology

## 2020-01-05 ENCOUNTER — Other Ambulatory Visit: Payer: Self-pay

## 2020-01-05 ENCOUNTER — Ambulatory Visit (INDEPENDENT_AMBULATORY_CARE_PROVIDER_SITE_OTHER): Payer: Medicare Other | Admitting: Neurology

## 2020-01-05 ENCOUNTER — Ambulatory Visit (INDEPENDENT_AMBULATORY_CARE_PROVIDER_SITE_OTHER): Payer: Medicare Other | Admitting: Clinical

## 2020-01-05 ENCOUNTER — Telehealth: Payer: Self-pay | Admitting: Family Medicine

## 2020-01-05 VITALS — BP 160/76 | HR 67 | Ht 59.0 in | Wt 104.4 lb

## 2020-01-05 DIAGNOSIS — G20 Parkinson's disease: Secondary | ICD-10-CM | POA: Diagnosis not present

## 2020-01-05 DIAGNOSIS — F4329 Adjustment disorder with other symptoms: Secondary | ICD-10-CM

## 2020-01-05 NOTE — Telephone Encounter (Signed)
I hope pt is doing well.  It is ok to get the COVID vaccine.  Pt should wait at least 2 weeks before getting the COVID vaccine and any other vaccine.

## 2020-01-05 NOTE — Patient Instructions (Signed)
Let me know if you would like a referral to [facility] for counseling.    The physicians and staff at [facility] are committed to providing excellent care. You may receive a survey requesting feedback about your experience at our office. We strive to receive "very good" responses to the survey questions. If you feel that your experience would prevent you from giving the office a "very good " response, please contact our office to try to remedy the situation. We may be reached at [phone]. Thank you for taking the time out of your busy day to complete the survey.

## 2020-01-05 NOTE — BH Specialist Note (Signed)
  Referring Provider: Kerin Salen, DO Date of Referral: 01/05/2020 Primary Reason for Referral: New PD dx Location of Visit: Individual, office visit  Suicide/Homicide Risk: Pt denies risk  Subjective Notes: surprised by dx, nobody in family with PD concerned about maintaining current activity level, enjoys golf and exercises (step aerobics) currently socially engaged with friends, talks with family via phone who lives outside of [place] concerned re social perceptions of PD  Psychosocial Assessment Patient presents today for psychoeducation with LCSW following with new diagnosis of Parkinson's Disease by referring provider Dr. Lurena Joiner Tat. LCSW provided patient education re motor and nonmotor Parkinson's symptoms including apathy, depression, incontinence/constipation, sleep behavior disorders, communication and cognitive impairment and the value of multidisciplinary care to manage symptoms. LCSW provided supportive counseling as patient considers social perceptions around PD and plan for communicating diagnosis with friends/family.  LCSW provided pt with information about our support and educational groups for patients with Parkinson's as well as their care partners, also discussed the importance of forced intense exercise in the management of PD and provided information about current exercise opportunities. Also discussed availability of individual counseling sessions to address the adjustment of living with chronic disease of Parkinson's, invited patient to schedule with LCSW Link Snuffer with [facility] as desired. Also provided pt with counseling information for local women's Parkinson's group. Pt responded receptively to patient education today. Pt strengths include high motivation for treatment adherence, active lifestyle, social support. Pt would benefit from targeting exercise to include more forced intense exercise and engagement with Parkinson's community resource to aid in adjustment to  diagnosis.    Brief Interventions provided today in session 1. psychoeducation, patient education 2. Supportive counseling   Plan 1. Read "Newly Diagnosed"/ FAQs (Parkinson[employer]), Information provided to pt. Contact LCSW with any questions related to Parkinson's & behavioral health  2. Goal for exercise- consider Cycling, RSB or other forced intense exercise 3. Consider individual counseling and social education groups for increased support  Behavioral Health treatment recommendations communicated to referring provider and pt states agreement with plan. LCSW will remain available for future consultation.

## 2020-01-05 NOTE — Telephone Encounter (Signed)
Please advise 

## 2020-01-05 NOTE — Telephone Encounter (Signed)
Pt found out she has parkinson disease. She wants to know if she is ok to get the COVID vaccine and she says she is due for her 2nd shingle inj?     Pt can be reached at [phone] -ok to leave a detailed message per pt

## 2020-01-06 NOTE — Telephone Encounter (Signed)
Spoke with pt verbalized understanding of Dr Salomon Fick recommendations regarding COVID and Shingles vaccine

## 2020-01-11 DIAGNOSIS — H2513 Age-related nuclear cataract, bilateral: Secondary | ICD-10-CM | POA: Diagnosis not present

## 2020-02-02 DIAGNOSIS — Z23 Encounter for immunization: Secondary | ICD-10-CM | POA: Diagnosis not present

## 2020-03-08 DIAGNOSIS — Z23 Encounter for immunization: Secondary | ICD-10-CM | POA: Diagnosis not present

## 2020-06-27 NOTE — Progress Notes (Signed)
Assessment/Plan:   1.  Parkinsons Disease  -we discussed medication.  She has added several supplements and discussed with patient that they have not been shown to be helpful.  Ultimately, we decided together to hold off on medication for now.  -had several questions and answered to the best of my ability  -discussed music/drumming  -information given to dance for Parkinsons Disease classes, Clare Gandy  2.  Elevated BP  -BP elevated in office today.  Pt reports that she is monitoring at home and at gym and it is running in the 120's systolic.    3.  Sciatica  -pt would like sports med DO that does OMM.  Will send referral to Dr. Terrilee Files   Subjective:   Jennifer Lutz was seen today in follow up for Parkinsons disease, diagnosed last visit.  My previous records were reviewed prior to todays visit as well as outside records available to me.  She opted to hold on medications after our last visit.  She did join the gym and is going 3 days per week and is enjoying that.   She is noting what she thinks is sciatica x few weeks - R buttocks and radiating down the R leg.  She rarely takes medication and had to take 2 advil and that was unusual for her.  We did discuss counseling for adjustment to the diagnosis, but she opted to hold on that.  Pt denies falls.  Pt denies lightheadedness, near syncope.  No hallucinations.  Mentions that her mood has been up and down - googled too much about Parkinsons Disease when she got the dx.    Current prescribed movement disorder medications: None   PREVIOUS MEDICATIONS: none to date  ALLERGIES:  No Known Allergies  CURRENT MEDICATIONS:  Outpatient Encounter Medications as of 07/03/2020  Medication Sig   aspirin 81 MG tablet Take 81 mg by mouth daily.   magnesium 30 MG tablet Take 30 mg by mouth daily.   Omega-3 Fatty Acids (OMEGA-3 FISH OIL) 500 MG CAPS Take 50 mg by mouth.   OVER THE COUNTER MEDICATION Calcium with D3 500 mg    Turmeric (QC TUMERIC COMPLEX PO) Take 1 tablet by mouth daily.   [DISCONTINUED] Multiple Vitamins-Minerals (HAIR/SKIN/NAILS PO) Take by mouth. (Patient not taking: Reported on 07/03/2020)   No facility-administered encounter medications on file as of 07/03/2020.    Objective:   PHYSICAL EXAMINATION:    VITALS:   Vitals:   07/03/20 0813  BP: (!) 170/91  Pulse: 87  SpO2: 96%  Weight: 103 lb (46.7 kg)  Height: 4\' 11"  (1.499 m)    GEN:  The patient appears stated age and is in NAD. HEENT:  Normocephalic, atraumatic.  The mucous membranes are moist. The superficial temporal arteries are without ropiness or tenderness. CV:  RRR Lungs:  CTAB Neck/HEME:  There are no carotid bruits bilaterally.  Neurological examination:  Orientation: The patient is alert and oriented x3. Cranial nerves: There is good facial symmetry with minimal facial hypomimia. The speech is fluent and clear. Soft palate rises symmetrically and there is no tongue deviation. Hearing is intact to conversational tone. Sensation: Sensation is intact to light touch throughout Motor: Strength is at least antigravity x4.  Movement examination: Tone: There is mild increased tone in the RUE, only with activation procedures Abnormal movements: there is RUE rest tremor and  RLE rest tremor Coordination:  There is  decremation with RAM's, with any form of RAMS, including alternating  supination and pronation of the forearm, hand opening and closing, finger taps, heel taps and toe taps on the right Gait and Station: The patient has no difficulty arising out of a deep-seated chair without the use of the hands. The patient's stride length is good but she is flexed at the waist.    I have reviewed and interpreted the following labs independently    Chemistry      Component Value Date/Time   NA 140 09/01/2019 0838   K 4.0 09/01/2019 0838   CL 100 09/01/2019 0838   CO2 28 09/01/2019 0838   BUN 23 09/01/2019 0838    CREATININE 1.09 09/01/2019 0838      Component Value Date/Time   CALCIUM 9.8 09/01/2019 0838   ALKPHOS 48 09/07/2017 1608   AST 21 09/07/2017 1608   ALT 13 09/07/2017 1608   BILITOT 0.5 09/07/2017 1608       Lab Results  Component Value Date   WBC 5.8 09/01/2019   HGB 14.6 09/01/2019   HCT 43.1 09/01/2019   MCV 98.3 09/01/2019   PLT 236.0 09/01/2019    Lab Results  Component Value Date   TSH 2.48 09/01/2019     Total time spent on today's visit was 30 minutes, including both face-to-face time and nonface-to-face time.  Time included that spent on review of records (prior notes available to me/labs/imaging if pertinent), discussing treatment and goals, answering patient's questions and coordinating care.  Cc:  Deeann Saint, MD

## 2020-07-03 ENCOUNTER — Other Ambulatory Visit: Payer: Self-pay

## 2020-07-03 ENCOUNTER — Encounter: Payer: Self-pay | Admitting: Neurology

## 2020-07-03 ENCOUNTER — Ambulatory Visit (INDEPENDENT_AMBULATORY_CARE_PROVIDER_SITE_OTHER): Payer: Medicare Other | Admitting: Neurology

## 2020-07-03 VITALS — BP 153/77 | HR 87 | Ht 59.0 in | Wt 103.0 lb

## 2020-07-03 DIAGNOSIS — M5431 Sciatica, right side: Secondary | ICD-10-CM | POA: Diagnosis not present

## 2020-07-03 DIAGNOSIS — G20 Parkinson's disease: Secondary | ICD-10-CM | POA: Diagnosis not present

## 2020-07-03 NOTE — Patient Instructions (Addendum)
1. Dance for Parkinsons classes, contact information:  [employer] [facility] [employer] [phone] [email or web address]   2.  I will send a referral to Dr. Antoine Primas.  His contact information is as follows:   Address: [address], [place], [place] [address] Phone: [phone]

## 2020-07-10 NOTE — Progress Notes (Signed)
[name] [address] [place] [address] Phone: [phone] Subjective:   Bruce Donath, am serving as a scribe for Dr. Antoine Primas. This visit occurred during the SARS-CoV-2 public health emergency.  Safety protocols were in place, including screening questions prior to the visit, additional usage of staff PPE, and extensive cleaning of exam room while observing appropriate contact time as indicated for disinfecting solutions.   I'm seeing this patient by the request  of:  Deeann Saint, MD  CC: Low back pain with radiation  EQA:[id number]  Jennifer Lutz is a 68 y.o. female coming in with complaint of low back pain. Patient states that her pain began in February. Also having radiating pain down into right hip that began 2 weeks ago. Patient has parkinson's and has been working out a lot due to this diagnosis. Patient is working out 3x a week. Does a mixture of weights and cardio.  Patient denies any true weakness, denies it ever waking her up, avoids all type of over-the-counter medications other than what is listed on her list.      Past Medical History:  Diagnosis Date   Complication of anesthesia    'takes very little.' 'Hard to wake up'   Degenerative disc disease, lumbar    Medical history non-contributory    Past Surgical History:  Procedure Laterality Date   NASAL SEPTUM SURGERY  1985   WRIST ARTHROSCOPY WITH ULNA SHORTENING Left 06/07/2015   Procedure: ARTHROSCOPY LEFT WRIST DEBRIDEMENT ULNAR SHORTENING;  Surgeon: Cindee Salt, MD;  Location: [facility];  Service: Orthopedics;  Laterality: Left;   WRIST OSTEOTOMY Left 06/07/2015   Procedure: LEFT OSTEOTOMY;  Surgeon: Cindee Salt, MD;  Location: [facility];  Service: Orthopedics;  Laterality: Left;   Social History   Socioeconomic History   Marital status: Married    Spouse name: Not on file   Number of children: Not on file    Years of education: Not on file   Highest education level: Not on file  Occupational History   Not on file  Tobacco Use   Smoking status: Never Smoker   Smokeless tobacco: Never Used  Vaping Use   Vaping Use: Never used  Substance and Sexual Activity   Alcohol use: Yes    Comment: 2 glasses qd   Drug use: No   Sexual activity: Not on file  Other Topics Concern   Not on file  Social History Narrative   Not on file   Social Determinants of Health   Financial Resource Strain:    Difficulty of Paying Living Expenses: Not on file  Food Insecurity:    Worried About Running Out of Food in the Last Year: Not on file   Ran Out of Food in the Last Year: Not on file  Transportation Needs:    Lack of Transportation (Medical): Not on file   Lack of Transportation (Non-Medical): Not on file  Physical Activity:    Days of Exercise per Week: Not on file   Minutes of Exercise per Session: Not on file  Stress:    Feeling of Stress : Not on file  Social Connections:    Frequency of Communication with Friends and Family: Not on file   Frequency of Social Gatherings with Friends and Family: Not on file   Attends Religious Services: Not on file   Active Member of Clubs or Organizations: Not on file   Attends Banker Meetings:  Not on file   Marital Status: Not on file   No Known Allergies Family History  Problem Relation Age of Onset   Stroke Other    Stroke Mother        Current Outpatient Medications (Analgesics):    aspirin 81 MG tablet, Take 81 mg by mouth daily.   Current Outpatient Medications (Other):    magnesium 30 MG tablet, Take 30 mg by mouth daily.   Omega-3 Fatty Acids (OMEGA-3 FISH OIL) 500 MG CAPS, Take 50 mg by mouth.   OVER THE COUNTER MEDICATION, Calcium with D3 500 mg   Turmeric (QC TUMERIC COMPLEX PO), Take 1 tablet by mouth daily.   Reviewed prior external information including notes and imaging from  primary  care provider As well as notes that were available from care everywhere and other healthcare systems.  Past medical history, social, surgical and family history all reviewed in electronic medical record.  No pertanent information unless stated regarding to the chief complaint.   Review of Systems:  No headache, visual changes, nausea, vomiting, diarrhea, constipation, dizziness, abdominal pain, skin rash, fevers, chills, night sweats, weight loss, swollen lymph nodes, body aches, joint swelling, chest pain, shortness of breath, mood changes. POSITIVE muscle aches  Objective  Blood pressure 110/82, pulse 74, height 4\' 11"  (1.499 m), weight 103 lb (46.7 kg), SpO2 98 %.   General: No apparent distress alert and oriented x3 mood and affect normal, dressed appropriately.  HEENT: Pupils equal, extraocular movements intact  Respiratory: Patient's speak in full sentences and does not appear short of breath  Cardiovascular: No lower extremity edema, non tender, no erythema  Gait normal with good balance and coordination.  MSK: Mild resting tremor of the right upper extremity. Low back has very mild scoliosis noted with some mild loss of lordosis.  Mild hypertonicity of the lumbar spine musculature.  No spinous process tenderness noted. 5 out of 5 strength in lower extremities.  Mild positive with tenderness over the piriformis     Osteopathic findings  T7 extended rotated and side bent left L4 flexed rotated and side bent right Sacrum right on right  Impression and Recommendations:     The above documentation has been reviewed and is accurate and complete Pearlean Brownie, DO

## 2020-07-11 ENCOUNTER — Ambulatory Visit (INDEPENDENT_AMBULATORY_CARE_PROVIDER_SITE_OTHER): Payer: Medicare Other | Admitting: Family Medicine

## 2020-07-11 ENCOUNTER — Encounter: Payer: Self-pay | Admitting: Family Medicine

## 2020-07-11 ENCOUNTER — Other Ambulatory Visit: Payer: Self-pay

## 2020-07-11 VITALS — BP 110/82 | HR 74 | Ht 59.0 in | Wt 103.0 lb

## 2020-07-11 DIAGNOSIS — M999 Biomechanical lesion, unspecified: Secondary | ICD-10-CM | POA: Diagnosis not present

## 2020-07-11 DIAGNOSIS — M545 Low back pain, unspecified: Secondary | ICD-10-CM | POA: Diagnosis not present

## 2020-07-11 DIAGNOSIS — G8929 Other chronic pain: Secondary | ICD-10-CM

## 2020-07-11 DIAGNOSIS — M5441 Lumbago with sciatica, right side: Secondary | ICD-10-CM | POA: Diagnosis not present

## 2020-07-11 NOTE — Assessment & Plan Note (Signed)

## 2020-07-11 NOTE — Assessment & Plan Note (Signed)
Patient is having some low back pain with some mild radicular symptoms down the right leg.  I do believe that this is multifactorial.  Could be secondary to some hypertonicity secondary to Parkinson's disease.  I believe the patient is having more of a piriformis syndrome as well.  Given exercises, responded well to osteopathic manipulation, discussed posture and ergonomics, discussed core strengthening and hip abductor strengthening.  Patient wants to avoid prescription medications if possible.  Follow-up with me again 4 to 8 weeks

## 2020-07-11 NOTE — Patient Instructions (Signed)
Piriformis Good to see you.  Turmeric 500mg  daily  Tart cherry extract 1200mg  at night Vitamin D 2000 IU daily  See me again in 4- 5 weeks

## 2020-08-07 NOTE — Progress Notes (Signed)
[name] [address] [place] [address] Phone: [phone] Subjective:   Jennifer Lutz, am serving as a scribe for Dr. Antoine Primas. This visit occurred during the SARS-CoV-2 public health emergency.  Safety protocols were in place, including screening questions prior to the visit, additional usage of staff PPE, and extensive cleaning of exam room while observing appropriate contact time as indicated for disinfecting solutions.   I'm seeing this patient by the request  of:  Jennifer Saint, MD  CC: Low back and neck pain follow-up  [id number]  Jennifer Lutz is a 69 y.o. female coming in with complaint of back and neck pain. OMT 07/11/2020. Patient states the pain in her right leg is about 95% better but low back is still bothering her which doesn't surprise her.  Patient states that she still has some discomfort and pain from time to time but nothing that stopping her from activities. Patient is taking some of the vitamin supplementations we discussed previously.        Reviewed prior external information including notes and imaging from previsou exam, outside providers and external EMR if available.   As well as notes that were available from care everywhere and other healthcare systems.  Past medical history, social, surgical and family history all reviewed in electronic medical record.  No pertanent information unless stated regarding to the chief complaint.   Past Medical History:  Diagnosis Date  . Complication of anesthesia    'takes very little.' 'Hard to wake up'  . Degenerative disc disease, lumbar   . Medical history non-contributory     No Known Allergies   Review of Systems:  No headache, visual changes, nausea, vomiting, diarrhea, constipation, dizziness, abdominal pain, skin rash, fevers, chills, night sweats, weight loss, swollen lymph nodes, body aches, joint swelling, chest pain, shortness of breath,  mood changes. POSITIVE muscle aches  Objective  Blood pressure 104/64, pulse 77, height 4\' 11"  (1.499 m), weight 103 lb (46.7 kg), SpO2 99 %.   General: No apparent distress alert and oriented x3 mood and affect normal, dressed appropriately.  Mild masked facies.  Patient does have a right upper extremity tremor noted HEENT: Pupils equal, extraocular movements intact  Respiratory: Patient's speak in full sentences and does not appear short of breath  Cardiovascular: No lower extremity edema, non tender, no erythema  Gait normal with good balance and coordination.  MSK: Hypertonicity noted.  Right upper extremity tremor noted Back - Normal skin, Spine with normal alignment and no deformity.  No tenderness to vertebral process palpation.  Paraspinous muscles are not tender and without spasm.   Range of motion is full at neck and lumbar sacral regions  Osteopathic findings   T4 extended rotated and side bent left L4 flexed rotated and side bent left Sacrum right on right       Assessment and Plan:  Low back pain Low back pain is multifactorial.  Patient is responding very well though to the home exercises, patient did respond well to the manipulation.  No longer having radicular symptoms anymore.  Discussed with patient to continue the exercises and the vitamin supplementations.  Follow-up with me again in 6 to 8 weeks.    Nonallopathic problems  Decision today to treat with OMT was based on Physical Exam  After verbal consent patient was treated with HVLA, ME, FPR techniques in thoracic, lumbar, and sacral  areas  Patient tolerated the procedure well with improvement in symptoms  Patient given exercises, stretches and lifestyle modifications  See medications in patient instructions if given  Patient will follow up in 4-8 weeks      The above documentation has been reviewed and is accurate and complete Judi Saa, DO       Note: This dictation was prepared with  Dragon dictation along with smaller phrase technology. Any transcriptional errors that result from this process are unintentional.

## 2020-08-08 ENCOUNTER — Other Ambulatory Visit: Payer: Self-pay

## 2020-08-08 ENCOUNTER — Ambulatory Visit (INDEPENDENT_AMBULATORY_CARE_PROVIDER_SITE_OTHER): Payer: Medicare Other | Admitting: Family Medicine

## 2020-08-08 ENCOUNTER — Encounter: Payer: Self-pay | Admitting: Family Medicine

## 2020-08-08 DIAGNOSIS — M999 Biomechanical lesion, unspecified: Secondary | ICD-10-CM | POA: Diagnosis not present

## 2020-08-08 DIAGNOSIS — M5441 Lumbago with sciatica, right side: Secondary | ICD-10-CM | POA: Diagnosis not present

## 2020-08-08 DIAGNOSIS — G8929 Other chronic pain: Secondary | ICD-10-CM | POA: Diagnosis not present

## 2020-08-08 NOTE — Assessment & Plan Note (Signed)
Low back pain is multifactorial.  Patient is responding very well though to the home exercises, patient did respond well to the manipulation.  No longer having radicular symptoms anymore.  Discussed with patient to continue the exercises and the vitamin supplementations.  Follow-up with me again in 6 to 8 weeks.

## 2020-08-08 NOTE — Patient Instructions (Addendum)
Good to see you  You are doing awesome Keep up with the exercises If increase weight drop reps Continue the vitamins See me again in 6-8 weeks Happy holidays!

## 2020-09-19 ENCOUNTER — Other Ambulatory Visit: Payer: Self-pay

## 2020-09-19 ENCOUNTER — Ambulatory Visit (INDEPENDENT_AMBULATORY_CARE_PROVIDER_SITE_OTHER): Payer: Medicare Other

## 2020-09-19 ENCOUNTER — Ambulatory Visit (INDEPENDENT_AMBULATORY_CARE_PROVIDER_SITE_OTHER): Payer: Medicare Other | Admitting: Family Medicine

## 2020-09-19 ENCOUNTER — Encounter: Payer: Self-pay | Admitting: Family Medicine

## 2020-09-19 VITALS — BP 110/84 | HR 83 | Ht 59.0 in | Wt 100.0 lb

## 2020-09-19 DIAGNOSIS — M545 Low back pain, unspecified: Secondary | ICD-10-CM

## 2020-09-19 DIAGNOSIS — G20A1 Parkinson's disease without dyskinesia, without mention of fluctuations: Secondary | ICD-10-CM

## 2020-09-19 DIAGNOSIS — G8929 Other chronic pain: Secondary | ICD-10-CM

## 2020-09-19 DIAGNOSIS — M999 Biomechanical lesion, unspecified: Secondary | ICD-10-CM

## 2020-09-19 DIAGNOSIS — M5441 Lumbago with sciatica, right side: Secondary | ICD-10-CM | POA: Diagnosis not present

## 2020-09-19 DIAGNOSIS — G20 Parkinson's disease: Secondary | ICD-10-CM

## 2020-09-19 DIAGNOSIS — Z23 Encounter for immunization: Secondary | ICD-10-CM | POA: Diagnosis not present

## 2020-09-19 NOTE — Progress Notes (Signed)
[name] [address] [place] [address] Phone: [phone] Subjective:   Bruce Donath, am serving as a scribe for Dr. Antoine Primas. This visit occurred during the SARS-CoV-2 public health emergency.  Safety protocols were in place, including screening questions prior to the visit, additional usage of staff PPE, and extensive cleaning of exam room while observing appropriate contact time as indicated for disinfecting solutions.   I'm seeing this patient by the request  of:  Deeann Saint, MD  CC: Back and neck pain follow-up  [id number]  Jennifer Lutz is a 69 y.o. female coming in with complaint of back and neck pain. OMT 08/08/2020. Patient states that she continues to have lower back pain and right hip pain after sitting for a while. Does feel she is moving in right direction.  Patient does feel like she is making some improvement.  If she continues to move and workouts he seems to do better.  Medications patient has been prescribed:   Taking:        Reviewed prior external information including notes and imaging from previsou exam, outside providers and external EMR if available.   As well as notes that were available from care everywhere and other healthcare systems.  Past medical history, social, surgical and family history all reviewed in electronic medical record.  No pertanent information unless stated regarding to the chief complaint.   Past Medical History:  Diagnosis Date  . Complication of anesthesia    'takes very little.' 'Hard to wake up'  . Degenerative disc disease, lumbar   . Medical history non-contributory     No Known Allergies   Review of Systems:  No headache, visual changes, nausea, vomiting, diarrhea, constipation, dizziness, abdominal pain, skin rash, fevers, chills, night sweats, weight loss, swollen lymph nodes, body aches, joint swelling, chest pain, shortness of breath, mood changes.  POSITIVE muscle aches  Objective  Blood pressure 110/84, pulse 83, height 4\' 11"  (1.499 m), weight 100 lb (45.4 kg), SpO2 99 %.   General: No apparent distress alert and oriented x3 mood and affect normal, dressed appropriately.  Mild masked facies HEENT: Pupils equal, extraocular movements intact  Respiratory: Patient's speak in full sentences and does not appear short of breath  Cardiovascular: No lower extremity edema, non tender, no erythema  Resting tremor noted in the right upper extremity. Mild hypertonicity noted of multiple joints. Low back exam does show significant loss of lordosis.  No significant cogwheeling well.  Patient has 4 out of 5 strength of the hip abductors otherwise full strength.  Mild tightness of the straight leg test but no true radicular symptoms.  Osteopathic findings  T5 extended rotated and side bent left L4 flexed rotated and side bent left  Sacrum right on right       Assessment and Plan:  Low back pain Chronic problem but overall stable.  Patient is continuing to be active.  Responding fairly well to osteopathic manipulation.  We discussed posture and ergonomics.  Discussed icing regimen and home exercises.  Patient will follow up with me again 6 to 8 weeks    Nonallopathic problems  Decision today to treat with OMT was based on Physical Exam  After verbal consent patient was treated with HVLA, ME, FPR techniques in  thoracic, lumbar, and sacral  areas  Patient tolerated the procedure well with improvement in symptoms  Patient given exercises, stretches and lifestyle modifications  See medications in patient instructions if given  Patient will follow up in 4-8 weeks      The above documentation has been reviewed and is accurate and complete Lyndal Pulley, DO       Note: This dictation was prepared with Dragon dictation along with smaller phrase technology. Any transcriptional errors that result from this process are  unintentional.

## 2020-09-19 NOTE — Patient Instructions (Addendum)
Xray today Stay active See me in 6-8 weeks

## 2020-09-19 NOTE — Assessment & Plan Note (Signed)
Chronic problem but overall stable.  Patient is continuing to be active.  Responding fairly well to osteopathic manipulation.  We discussed posture and ergonomics.  Discussed icing regimen and home exercises.  Patient will follow up with me again 6 to 8 weeks

## 2020-10-01 ENCOUNTER — Other Ambulatory Visit: Payer: Self-pay | Admitting: Family Medicine

## 2020-10-01 DIAGNOSIS — Z1231 Encounter for screening mammogram for malignant neoplasm of breast: Secondary | ICD-10-CM

## 2020-10-29 NOTE — Progress Notes (Signed)
[facility] [address] [place] [address] Phone: [phone] Subjective:   I Jennifer Lutz am serving as a Neurosurgeon for Dr. Antoine Primas.  This visit occurred during the SARS-CoV-2 public health emergency.  Safety protocols were in place, including screening questions prior to the visit, additional usage of staff PPE, and extensive cleaning of exam room while observing appropriate contact time as indicated for disinfecting solutions.   I'm seeing this patient by the request  of:  Deeann Saint, MD  CC: Back pain follow-up  [id number]  Jennifer Lutz is a 70 y.o. female coming in with complaint of back and neck pain. OMT 09/19/2020. Patient states she has improved. Doing much better.  Patient states that the exercises have been helpful.  Still taking some vitamins.  Feels like they have helped as well.  Medications patient has been prescribed: None         Reviewed prior external information including notes and imaging from previsou exam, outside providers and external EMR if available.   As well as notes that were available from care everywhere and other healthcare systems.  Past medical history, social, surgical and family history all reviewed in electronic medical record.  No pertanent information unless stated regarding to the chief complaint.   Past Medical History:  Diagnosis Date  . Complication of anesthesia    'takes very little.' 'Hard to wake up'  . Degenerative disc disease, lumbar   . Medical history non-contributory     No Known Allergies   Review of Systems:  No headache, visual changes, nausea, vomiting, diarrhea, constipation, dizziness, abdominal pain, skin rash, fevers, chills, night sweats, weight loss, swollen lymph nodes, body aches, joint swelling, chest pain, shortness of breath, mood changes. POSITIVE muscle aches  Objective  Blood pressure 134/82, pulse 70, height 4\' 11"  (1.499 m), weight 101 lb  (45.8 kg), SpO2 100 %.   General: No apparent distress alert and oriented x3 mood and affect normal, dressed appropriately.  Mild facies  HEENT: Pupils equal, extraocular movements intact  Respiratory: Patient's speak in full sentences and does not appear short of breath  Cardiovascular: No lower extremity edema, non tender, no erythema   Gait normal with good balance and coordination.  MSK: Hypertonicity noted.  Patient does have a resting tremor in the right upper extremity. Back -back exam does have significant tightness noted.  Osteopathic findings  T7 extended rotated and side bent left L2 flexed rotated and side bent right Sacrum right on right       Assessment and Plan:  Low back pain Patient is doing very well at this time.  Chronic problem but stable.  Has known arthritic changes.  Patient also has the underlying Parkinson's that is contributing to some of the muscle tightness.  Discussed icing regimen.  Discussed avoiding certain activities.  Follow-up with me again 6 to 8 weeks    Nonallopathic problems  Decision today to treat with OMT was based on Physical Exam  After verbal consent patient was treated with HVLA, ME, FPR techniques in thoracic, lumbar, and sacral  areas  Patient tolerated the procedure well with improvement in symptoms  Patient given exercises, stretches and lifestyle modifications  See medications in patient instructions if given  Patient will follow up in 6-8 weeks      The above documentation has been reviewed and is accurate and complete , DO       Note: This dictation was prepared with  Dragon dictation along with smaller Company secretary. Any transcriptional errors that result from this process are unintentional.

## 2020-10-30 ENCOUNTER — Encounter: Payer: Self-pay | Admitting: Family Medicine

## 2020-10-30 ENCOUNTER — Other Ambulatory Visit: Payer: Self-pay

## 2020-10-30 ENCOUNTER — Ambulatory Visit (INDEPENDENT_AMBULATORY_CARE_PROVIDER_SITE_OTHER): Payer: Medicare HMO | Admitting: Family Medicine

## 2020-10-30 VITALS — BP 134/82 | HR 70 | Ht 59.0 in | Wt 101.0 lb

## 2020-10-30 DIAGNOSIS — M999 Biomechanical lesion, unspecified: Secondary | ICD-10-CM | POA: Diagnosis not present

## 2020-10-30 DIAGNOSIS — M5441 Lumbago with sciatica, right side: Secondary | ICD-10-CM | POA: Diagnosis not present

## 2020-10-30 DIAGNOSIS — G8929 Other chronic pain: Secondary | ICD-10-CM | POA: Diagnosis not present

## 2020-10-30 NOTE — Patient Instructions (Addendum)
Good to see you Doing fantastic Exercises 2 times a week Listen to your body See me again in 2-3 months

## 2020-10-30 NOTE — Assessment & Plan Note (Signed)
Patient is doing very well at this time.  Chronic problem but stable.  Has known arthritic changes.  Patient also has the underlying Parkinson's that is contributing to some of the muscle tightness.  Discussed icing regimen.  Discussed avoiding certain activities.  Follow-up with me again 6 to 8 weeks

## 2020-10-31 ENCOUNTER — Encounter: Payer: Self-pay | Admitting: Family Medicine

## 2020-10-31 ENCOUNTER — Other Ambulatory Visit: Payer: Self-pay

## 2020-10-31 ENCOUNTER — Ambulatory Visit (INDEPENDENT_AMBULATORY_CARE_PROVIDER_SITE_OTHER): Payer: Medicare HMO | Admitting: Family Medicine

## 2020-10-31 VITALS — BP 118/80 | HR 63 | Temp 97.7°F | Ht 59.0 in | Wt 99.0 lb

## 2020-10-31 DIAGNOSIS — E782 Mixed hyperlipidemia: Secondary | ICD-10-CM

## 2020-10-31 DIAGNOSIS — G20 Parkinson's disease: Secondary | ICD-10-CM | POA: Diagnosis not present

## 2020-10-31 DIAGNOSIS — Z23 Encounter for immunization: Secondary | ICD-10-CM | POA: Diagnosis not present

## 2020-10-31 DIAGNOSIS — Z0000 Encounter for general adult medical examination without abnormal findings: Secondary | ICD-10-CM

## 2020-10-31 DIAGNOSIS — Z1211 Encounter for screening for malignant neoplasm of colon: Secondary | ICD-10-CM | POA: Diagnosis not present

## 2020-10-31 DIAGNOSIS — M810 Age-related osteoporosis without current pathological fracture: Secondary | ICD-10-CM

## 2020-10-31 LAB — LIPID PANEL
Cholesterol: 322 mg/dL — ABNORMAL HIGH (ref 0–200)
HDL: 105.3 mg/dL (ref >39.00)
LDL Cholesterol: 196 mg/dL — ABNORMAL HIGH (ref 0–99)
NonHDL: 217
Total CHOL/HDL Ratio: 3
Triglycerides: 103 mg/dL (ref 0.0–149.0)
VLDL: 20.6 mg/dL (ref 0.0–40.0)

## 2020-10-31 LAB — BASIC METABOLIC PANEL
BUN: 15 mg/dL (ref 6–23)
CO2: 31 mEq/L (ref 19–32)
Calcium: 10 mg/dL (ref 8.4–10.5)
Chloride: 100 mEq/L (ref 96–112)
Creatinine, Ser: 1.1 mg/dL (ref 0.40–1.20)
GFR: 50.39 mL/min — ABNORMAL LOW (ref >60.00)
Glucose, Bld: 88 mg/dL (ref 70–99)
Potassium: 4.2 mEq/L (ref 3.5–5.1)
Sodium: 139 mEq/L (ref 135–145)

## 2020-10-31 LAB — CBC WITH DIFFERENTIAL/PLATELET
Basophils Absolute: 0 10*3/uL (ref 0.0–0.1)
Basophils Relative: 0.6 % (ref 0.0–3.0)
Eosinophils Absolute: 0.1 10*3/uL (ref 0.0–0.7)
Eosinophils Relative: 1.9 % (ref 0.0–5.0)
HCT: 41.9 % (ref 36.0–46.0)
Hemoglobin: 14.2 g/dL (ref 12.0–15.0)
Lymphocytes Relative: 25.8 % (ref 12.0–46.0)
Lymphs Abs: 1.2 10*3/uL (ref 0.7–4.0)
MCHC: 34 g/dL (ref 30.0–36.0)
MCV: 97.1 fl (ref 78.0–100.0)
Monocytes Absolute: 0.3 10*3/uL (ref 0.1–1.0)
Monocytes Relative: 6.8 % (ref 3.0–12.0)
Neutro Abs: 2.9 10*3/uL (ref 1.4–7.7)
Neutrophils Relative %: 64.9 % (ref 43.0–77.0)
Platelets: 228 10*3/uL (ref 150.0–400.0)
RBC: 4.31 Mil/uL (ref 3.87–5.11)
RDW: 12.5 % (ref 11.5–15.5)
WBC: 4.5 10*3/uL (ref 4.0–10.5)

## 2020-10-31 LAB — HEMOGLOBIN A1C: Hgb A1c MFr Bld: 5.5 % (ref 4.6–6.5)

## 2020-10-31 LAB — VITAMIN D 25 HYDROXY (VIT D DEFICIENCY, FRACTURES): VITD: 108.6 ng/mL (ref 30.00–100.00)

## 2020-10-31 LAB — TSH: TSH: 2.62 u[IU]/mL (ref 0.35–4.50)

## 2020-10-31 LAB — T4, FREE: Free T4: 1.1 ng/dL (ref 0.60–1.60)

## 2020-10-31 NOTE — Progress Notes (Signed)
Subjective:     Jennifer Lutz is a 70 y.o. female and is here for a comprehensive physical exam. Pt states she is doing well.  Denies falls or injury.  Exercising at the gym [place], Sa and playing golf which helps with Parkinson's dz.  Pt followed by Dr. Arbutus Leas.  Inquires about cologaurd and influenza vaccine.  Taking Vitamin D for h/o osteoporosis.  Social History   Socioeconomic History  . Marital status: Married    Spouse name: Not on file  . Number of children: Not on file  . Years of education: Not on file  . Highest education level: Not on file  Occupational History  . Not on file  Tobacco Use  . Smoking status: Never Smoker  . Smokeless tobacco: Never Used  Vaping Use  . Vaping Use: Never used  Substance and Sexual Activity  . Alcohol use: Yes    Comment: 2 glasses qd  . Drug use: No  . Sexual activity: Not on file  Other Topics Concern  . Not on file  Social History Narrative  . Not on file   Social Determinants of Health   Financial Resource Strain: Not on file  Food Insecurity: Not on file  Transportation Needs: Not on file  Physical Activity: Not on file  Stress: Not on file  Social Connections: Not on file  Intimate Partner Violence: Not on file   Health Maintenance  Topic Date Due  . Hepatitis C Screening  Never done  . COLONOSCOPY (Pts 45-71yrs Insurance coverage will need to be confirmed)  09/22/2018  . INFLUENZA VACCINE  04/22/2020  . MAMMOGRAM  10/04/2021  . TETANUS/TDAP  12/27/2023  . DEXA SCAN  Completed  . COVID-19 Vaccine  Completed  . PNA vac Low Risk Adult  Completed    The following portions of the patient's history were reviewed and updated as appropriate: allergies, current medications, past family history, past medical history, past social history, past surgical history and problem list.  Review of Systems A comprehensive review of systems was negative.   Objective:    BP 118/80 (BP Location: Left Arm, Patient Position: Sitting,  Cuff Size: Normal)   Pulse 63   Temp 97.7 F (36.5 C) (Oral)   Ht 4\' 11"  (1.499 m)   Wt 99 lb (44.9 kg)   SpO2 98%   BMI 20.00 kg/m  General appearance: alert, cooperative and no distress Head: Normocephalic, without obvious abnormality, atraumatic Eyes: conjunctivae/corneas clear. PERRL, EOM's intact. Fundi benign. Ears: normal TM's and external ear canals both ears Nose: Nares normal. Septum midline. Mucosa normal. No drainage or sinus tenderness. Throat: lips, mucosa, and tongue normal; teeth and gums normal Neck: no adenopathy, no carotid bruit, no JVD, supple, symmetrical, trachea midline and thyroid not enlarged, symmetric, no tenderness/mass/nodules Lungs: clear to auscultation bilaterally Heart: regular rate and rhythm, S1, S2 normal, no murmur, click, rub or gallop Abdomen: soft, non-tender; bowel sounds normal; no masses,  no organomegaly Extremities: extremities normal, atraumatic, no cyanosis or edema Pulses: 2+ and symmetric Skin: Skin color, texture, turgor normal. No rashes or lesions Lymph nodes: Cervical, supraclavicular, and axillary nodes normal. Neurologic: RUE with mild to moderate resting tremor. Alert and oriented X 3, normal strength and tone. Normal symmetric reflexes. Normal coordination and gait    Assessment:    Healthy female exam.     Plan:      Routine general medical examination at a health care facility Anticipatory guidance given including wearing seatbelts, smoke detectors in  the home, increasing physical activity, increasing p.o. intake of water and vegetables. -will obtain labs -will place referral for cologuard -mammogram scheduled in a few months -pap no longer indicated. -given influenza vaccine this visit -given handout -next CPE in 1 yr. See After Visit Summary for Counseling Recommendations    Parkinson's disease (HCC)  -stable -continue f/u with Dr. Arbutus Leas, Neurology - Plan: CBC with Differential/Platelet, CBC with  Differential/Platelet  Need for influenza vaccination  - Plan: Flu Vaccine QUAD High Dose(Fluad)  Colon cancer screening  - Plan: Cologuard  Mixed hyperlipidemia -Total cholesterol 353, LDL 220 on 09/01/2019 -Lifestyle modification strongly encouraged -Continue OTC fish oil. - Plan: Hemoglobin A1c, Lipid panel  Osteoporosis without current pathological fracture, unspecified osteoporosis type  - Plan: Basic metabolic panel, TSH, T4, Free, Vitamin D, 25-hydroxy, Bone Density  F/u prn  Abbe Amsterdam, MD

## 2020-10-31 NOTE — Patient Instructions (Addendum)
Preventive Care 61 Years and Older, Female Preventive care refers to lifestyle choices and visits with your health care provider that can promote health and wellness. This includes:  A yearly physical exam. This is also called an annual wellness visit.  Regular dental and eye exams.  Immunizations.  Screening for certain conditions.  Healthy lifestyle choices, such as: ? Eating a healthy diet. ? Getting regular exercise. ? Not using drugs or products that contain nicotine and tobacco. ? Limiting alcohol use. What can I expect for my preventive care visit? Physical exam Your health care provider will check your:  Height and weight. These may be used to calculate your BMI (body mass index). BMI is a measurement that tells if you are at a healthy weight.  Heart rate and blood pressure.  Body temperature.  Skin for abnormal spots. Counseling Your health care provider may ask you questions about your:  Past medical problems.  Family's medical history.  Alcohol, tobacco, and drug use.  Emotional well-being.  Home life and relationship well-being.  Sexual activity.  Diet, exercise, and sleep habits.  History of falls.  Memory and ability to understand (cognition).  Work and work Statistician.  Pregnancy and menstrual history.  Access to firearms. What immunizations do I need? Vaccines are usually given at various ages, according to a schedule. Your health care provider will recommend vaccines for you based on your age, medical history, and lifestyle or other factors, such as travel or where you work.   What tests do I need? Blood tests  Lipid and cholesterol levels. These may be checked every 5 years, or more often depending on your overall health.  Hepatitis C test.  Hepatitis B test. Screening  Lung cancer screening. You may have this screening every year starting at age 72 if you have a 30-pack-year history of smoking and currently smoke or have quit within  the past 15 years.  Colorectal cancer screening. ? All adults should have this screening starting at age 72 and continuing until age 72. ? Your health care provider may recommend screening at age 72 if you are at increased risk. ? You will have tests every 1-10 years, depending on your results and the type of screening test.  Diabetes screening. ? This is done by checking your blood sugar (glucose) after you have not eaten for a while (fasting). ? You may have this done every 1-3 years.  Mammogram. ? This may be done every 1-2 years. ? Talk with your health care provider about how often you should have regular mammograms.  Abdominal aortic aneurysm (AAA) screening. You may need this if you are a current or former smoker.  BRCA-related cancer screening. This may be done if you have a family history of breast, ovarian, tubal, or peritoneal cancers. Other tests  STD (sexually transmitted disease) testing, if you are at risk.  Bone density scan. This is done to screen for osteoporosis. You may have this done starting at age 72. Talk with your health care provider about your test results, treatment options, and if necessary, the need for more tests. Follow these instructions at home: Eating and drinking  Eat a diet that includes fresh fruits and vegetables, whole grains, lean protein, and low-fat dairy products. Limit your intake of foods with high amounts of sugar, saturated fats, and salt.  Take vitamin and mineral supplements as recommended by your health care provider.  Do not drink alcohol if your health care provider tells you not to drink.  If you drink alcohol: ? Limit how much you have to 0-1 drink a day. ? Be aware of how much alcohol is in your drink. In the [place] one drink equals one 12 oz bottle of beer (355 mL), one 5 oz glass of wine (148 mL), or one 1 oz glass of hard liquor (44 mL).   Lifestyle  Take daily care of your teeth and gums. Brush your teeth every morning  and night with fluoride toothpaste. Floss one time each day.  Stay active. Exercise for at least 30 minutes 5 or more days each week.  Do not use any products that contain nicotine or tobacco, such as cigarettes, e-cigarettes, and chewing tobacco. If you need help quitting, ask your health care provider.  Do not use drugs.  If you are sexually active, practice safe sex. Use a condom or other form of protection in order to prevent STIs (sexually transmitted infections).  Talk with your health care provider about taking a low-dose aspirin or statin.  Find healthy ways to cope with stress, such as: ? Meditation, yoga, or listening to music. ? Journaling. ? Talking to a trusted person. ? Spending time with friends and family. Safety  Always wear your seat belt while driving or riding in a vehicle.  Do not drive: ? If you have been drinking alcohol. Do not ride with someone who has been drinking. ? When you are tired or distracted. ? While texting.  Wear a helmet and other protective equipment during sports activities.  If you have firearms in your house, make sure you follow all gun safety procedures. What's next?  Visit your health care provider once a year for an annual wellness visit.  Ask your health care provider how often you should have your eyes and teeth checked.  Stay up to date on all vaccines. This information is not intended to replace advice given to you by your health care provider. Make sure you discuss any questions you have with your health care provider. Document Revised: 08/29/2020 Document Reviewed: 09/02/2018 Elsevier Patient Education  2021 [place] Disease Parkinson's disease is a type of movement disorder. It is a long-term condition that gets worse over time (is progressive). Each person with Parkinson's disease is affected differently. This condition limits your ability to control movements and move your body normally. The condition  can range from mild to severe. Parkinson's disease tends to get worse slowly over several years. What are the causes? Parkinson's disease results from a loss of brain cells (neurons) that make a brain chemical called dopamine. Dopamine is needed to control movement. As the condition gets worse, neurons make less dopamine. This makes it hard to move or control your movements. The exact cause of the loss of neurons and why they make less dopamine is not known. Factors related to genes and the environment may contribute to the cause of Parkinson's disease. What increases the risk? The following factors may make you more likely to develop this condition:  Being female.  Being age 83 or older.  Having a family history of Parkinson's disease.  Having had a traumatic brain injury.  Having experienced depression.  Having been exposed to toxins, such as pesticides. What are the signs or symptoms? Symptoms of this condition can vary. The main symptoms are related to movement. These include:  A tremor or shaking while you are resting that you cannot control.  Stiffness in your neck, arms, and legs (rigidity).  Slowing of movement. You  may lose facial expressions and have trouble making small movements that are needed to button clothing or brush your teeth.  An abnormal walk. You may walk with short, shuffling steps.  Loss of balance and stability when standing. You may sway, fall backward, and have trouble making turns. Other symptoms include:  Mental or cognitive changes including depression, anxiety, having false beliefs (delusions), or seeing, hearing, or feeling things that do not exist (hallucinations).  Trouble speaking or swallowing.  Changes in bowel or bladder functions including constipation, having to go urgently or frequently, or not being able to control your bowel or bladder.  Changes in sleep habits or trouble sleeping. Parkinson's disease may be graded by severity of your  condition as mild, moderate, or advanced. Parkinson's disease progression is different for everyone. You may not progress to the advanced stage.  Mild Parkinson's disease involves: ? Movement problems that do not affect daily activities. ? Movement problems on one side of the body.  Moderate Parkinson's disease involves: ? Movement problems on both sides of the body. ? Slowing of movement. ? Coordination and balance problems.  Advanced Parkinson's disease involves: ? Extreme difficulty walking. ? Inability to live alone safely. ? Signs of dementia, such as having trouble remembering things, doing daily tasks such as getting dressed, and problem solving.   How is this diagnosed? This condition is diagnosed by a specialist. A diagnosis may be made based on symptoms, your medical history, and a physical exam. You may also have brain imaging tests to check for a loss of dopamine-producing areas of the brain. How is this treated? There is no cure for Parkinson's disease. Treatment focuses on managing your symptoms. Treatment may include:  Medicines. Everyone responds to medicines differently. Your response may change over time. Work with your health care provider to find the best medicines for you.  Speech, occupational, and physical therapy.  Deep brain stimulation surgery to reduce tremors and other involuntary movements. Follow these instructions at home: Medicines  Take over-the-counter and prescription medicines only as told by your health care provider.  Avoid taking medicines that can affect thinking, such as pain or sleeping medicines. Eating and drinking  Follow instructions from your health care provider about eating or drinking restrictions.  Do not drink alcohol. Activity  Talk with your health care provider about if it is safe for you to drive.  Do exercises as told by your health care provider or physical therapist. Lifestyle  Install grab bars and railings in your  home to prevent falls.  Do not use any products that contain nicotine or tobacco, such as cigarettes, e-cigarettes, and chewing tobacco. If you need help quitting, ask your health care provider.  Consider joining a support group for people with Parkinson's disease.      General instructions  Work with your health care provider to determine what you need help with and what your safety needs are.  Keep all follow-up visits as told by your health care provider, including any visits with a physical therapist, speech therapist, or occupational therapist. This is important. Contact a health care provider if:  Medicines do not help your symptoms.  You are unsteady or have fallen at home.  You need more support to function well at home.  You have trouble swallowing.  You have severe constipation.  You are having problems with side effects from your medicines.  You feel confused, anxious, or depressed. Get help right away if you:  Are injured after a fall.  See or hear things that are not real.  Cannot swallow without choking.  Have chest pain or trouble breathing.  Do not feel safe at home.  Have thoughts about hurting yourself or others. If you ever feel like you may hurt yourself or others, or have thoughts about taking your own life, get help right away. You can go to your nearest emergency department or call:  Your local emergency services (911 in the [place]  A suicide crisis helpline, such as the [place] at (919) 047-0037. This is open 24 hours a day. Summary  Parkinson's disease is a long-term condition that gets worse over time. This condition limits your ability to control your movements and move your body normally.  There is no cure for Parkinson's disease. Treatment focuses on managing your symptoms.  Work with your health care provider to determine what you need help with and what your safety needs are.  Keep all follow-up visits as  told by your health care provider, including any visits with a physical therapist, speech therapist, or occupational therapist. This is important. This information is not intended to replace advice given to you by your health care provider. Make sure you discuss any questions you have with your health care provider. Document Revised: 11/25/2018 Document Reviewed: 11/25/2018 Elsevier Patient Education  2021 [place] for Osteoporosis Osteoporosis causes your bones to become weak and brittle. This puts you at greater risk for bone breaks (fractures) from small bumps or falls. Making changes to your diet and increasing your physical activity can help strengthen your bones and improve your overall health. Calcium and vitamin D are nutrients that play an important role in bone health. Vitamin D helps your body use calcium and strengthen bones. It is important to get enough calcium and vitamin D as part of your eating plan for osteoporosis. What are tips for following this plan? Reading food labels  Try to get at least 1,000 milligrams (mg) of calcium each day.  Look for foods that have at least 50 mg of calcium per serving.  Talk with your health care provider about taking a calcium supplement if you do not get enough calcium from food.  Do not have more than 2,500 mg of calcium each day. This is the upper limit for food and nutritional supplements combined. Too much calcium may cause constipation and prevent you from absorbing other important nutrients.  Choose foods that contain vitamin D.  Take a daily vitamin supplement that contains 800-1,000 international units (IU) of vitamin D. The amount may be different depending on your age, body weight, and where you live. Talk with your dietitian or health care provider about how much vitamin D is right for you.  Avoid foods that have more than 300 mg of sodium per serving. Too much sodium can cause your body to lose calcium.  Talk  with your dietitian or health care provider about how much sodium you are allowed each day. Shopping  Do not buy foods with added salt, including: ? Salted snacks. ? Angie Fava. ? Canned soups. ? Canned meats. ? Processed meats, such as bacon or precooked or cured meat like sausages or meat loaves. ? Smoked fish. Meal planning  Eat balanced meals that contain protein foods, fruits and vegetables, and foods rich in calcium and vitamin D.  Eat at least 5 servings of fruits and vegetables each day.  Eat 5-6 oz (142-170 g) of lean meat, poultry, fish, eggs, or beans each day.  Lifestyle  Do not use any products that contain nicotine or tobacco, such as cigarettes, e-cigarettes, and chewing tobacco. If you need help quitting, ask your health care provider.  If your health care provider recommends that you lose weight: ? Work with a dietitian to develop an eating plan that will help you reach your desired weight goal. ? Exercise for at least 30 minutes a day, 5 or more days a week, or as told by your health care provider.  Work with a physical therapist to develop an exercise plan that includes flexibility, balance, and strength exercises. Do not focus only on aerobic exercise.  Do not drink alcohol if: ? Your health care provider tells you not to drink. ? You are pregnant, may be pregnant, or are planning to become pregnant.  If you drink alcohol: ? Limit how much you use to:  0-1 drink a day for women.  0-2 drinks a day for men. ? Be aware of how much alcohol is in your drink. In the [place] one drink equals one 12 oz bottle of beer (355 mL), one 5 oz glass of wine (148 mL), or one 1 oz glass of hard liquor (44 mL). What foods should I eat? Foods high in calcium  Yogurt. Yogurt with fruit.  Milk. Evaporated skim milk. Dry milk powder.  Calcium-fortified orange juice.  Parmesan cheese. Part-skim ricotta cheese. Natural hard cheese. Cream cheese. Cottage cheese.  Canned  sardines. Canned salmon.  Calcium-treated tofu. Calcium-fortified cereal bar. Calcium-fortified cereal. Calcium-fortified graham crackers.  Cooked collard greens. Turnip greens. Broccoli. Kale.  Almonds.  White beans.  Corn tortilla.   Foods high in vitamin D  Cod liver oil. Fatty fish, such as tuna, mackerel, and salmon.  Milk. Fortified soy milk. Fortified fruit juice.  Yogurt. Margarine.  Egg yolks. Foods high in protein  Beef. Lamb. Pork tenderloin.  Chicken breast.  Tuna (canned). Fish fillet.  Tofu.  Cooked soy beans. Soy patty. Beans (canned or cooked).  Cottage cheese.  Yogurt.  Peanut butter.  Pumpkin seeds. Nuts. Sunflower seeds.  Hard cheese.  Milk or other milk products, such as soy milk. The items listed above may not be a complete list of foods and beverages you can eat. Contact a dietitian for more options. Summary  Calcium and vitamin D are nutrients that play an important role in bone health and are an important part of your eating plan for osteoporosis.  Eat balanced meals that contain protein foods, fruits and vegetables, and foods rich in calcium and vitamin D.  Avoid foods that have more than 300 mg of sodium per serving. Too much sodium can cause your body to lose calcium.  Exercise is an important part of prevention and treatment of osteoporosis. Aim for at least 30 minutes a day, 5 days a week. This information is not intended to replace advice given to you by your health care provider. Make sure you discuss any questions you have with your health care provider. Document Revised: 02/23/2020 Document Reviewed: 02/23/2020 Elsevier Patient Education  2021 Elsevier Inc.  https://www.uspreventiveservicestaskforce.org/uspstf/recommendation/lung-cancer-screening">  Cancer Screening for Women A cancer screening is a test or exam that checks for cancer. Your health care provider will recommend specific cancer screenings based on your age,  medical history (including risk factors), and family history of cancer. Work with your health care provider to create a cancer screening schedule that protects your health. Who should have screening? All women should be considered for screening of certain cancers, including breast cancer,  cervical cancer, colorectal cancer, and skin cancer. Your health care provider may recommend screenings for other types of cancer if:  You had cancer before.  You have a family member with cancer.  You have abnormal genes that could increase the risk of cancer.  You have risk factors for certain cancers, such as current or past use of tobacco products, or being overweight. When you should be screened for cancer depends on:  Your age.  Your medical history and your family's medical history.  Certain lifestyle factors, such as smoking or other use of tobacco products.  Environmental exposure, such as to asbestos. How is screening done? Breast cancer Breast cancer screening is done with a test that takes images of breast tissue (mammogram). Here are some screening guidelines for women at average risk:  When you are age 13-44, you will be given the choice to start having mammograms.  When you are age 46-54, you should have a mammogram every year.  You may start having mammograms before age 58 if you have risk factors for breast cancer, such as having an immediate family member with breast cancer.  At age 12 or older, you should have a mammogram every 1-2 years for as long as you are in good health and have a life expectancy of 10 years or longer.  It is important to know what your breasts look and feel like so you can report any changes to your health care provider.   Cervical cancer  All women at average risk should be considered for screening for cervical cancer starting no later than age 25 and continuing until age 24. Screening should not begin earlier than age 58. You will have tests every 3-5  years, depending on your results and the type of screening test. Talk with your health care provider about which screening test is right for you and how often you should be screened. ? Cervical cancer screening is done with an HPV (human papillomavirus) test to identify the virus that causes cervical cancer. To perform the test, a health care provider takes a swab of cells from yourcervix during a pelvic exam. This test may be performed along with a Pap test. This testchecks for abnormalities in the lowest part of the uterus (cervix). ? If you have had the HPV vaccine, you will still be screened for cervical cancer and follow normal screening recommendations. You do not need to be screened for cervical cancer if any of the following apply to you:  You are older than age 17 and you have had normal screening tests in the past 10 years with no serious cervical precancer or cancer in the last 25 years.  Your cervix and uterus have been removed, and you have never had cervical cancer or abnormal cells that could become cancer (precancerous cells). Colorectal cancer All adults at average risk should be considered for screening for colorectal cancer starting at age 91 and continuing until age 12. Your health care provider may recommend screening at age 77 if you are at higher risk due to family history of colon or rectal cancer or other risk factors. You will have tests every 1-10 years, depending on your results and the type of screening test. Talk with your health care provider about which screening test is right for you and how often you should be screened. Colorectal cancer screening looks for cancer or for growths called polyps that often form before cancer starts. Tests to look for cancer or polyps include:  Colonoscopy or  flexible sigmoidoscopy. For these procedures, a flexible tube with a small camera is inserted into the rectum.  CT colonography. This test uses X-rays and a contrast dye to check the  colon for polyps. If a polyp is found, you may need to have a colonoscopy so the polyp can be located and removed. Tests to look for cancer in the stool (feces) include:  Guaiac-based fecal occult blood test (FOBT). This test can find blood in stool. It can be done at home with a kit.  Fecal immunochemical test (FIT). This test can find blood in stool. For this test, you will need to collect stool samples at home.  Stool DNA test. This test looks for blood in stool and any changes in DNA that can lead to colon cancer. For this test, you will need to collect a stool sample at home and send it to a lab. Endometrial cancer There is no standard screening test for endometrial cancer, and women at average risk do not routinely need to have this screening. Talk with your health care provider about whether screening is right for you. If it is, this screening is performed through:  Endometrial tissue biopsy. This tests a sample of tissue taken from the lining of the uterus.  Vaginal ultrasound. If you are at increased risk for endometrial cancer, you may need to have these tests more often than normal. You are at increased risk if:  You have a family history of ovarian, uterine, or colon cancer.  You are taking tamoxifen, a medicine used to treat breast cancer.  You have certain types of colon cancer. If you have reached menopause, it is especially important to talk with your health care provider about any vaginal bleeding or spotting. Screening for endometrial cancer is not recommended for women who do not have symptoms of the cancer, such as vaginal bleeding. Lung cancer Lung cancer screening is done with a CT scan that looks for abnormal changes in the lungs. Discuss lung cancer screening with your health care provider if you are 86-69 years old and if any of the following apply to you:  You currently smoke.  You used to smoke heavily.  You have a smoking history of 1 pack of cigarettes a day  for 20 years or 2 packs a day for 10 years.  You have quit smoking within the past 15 years. You may need to be screened every year if you smoke heavily or if you used to smoke. Skin cancer Skin cancer screening is done by checking the skin for unusual moles or spots and any changes in existing moles. Your health care provider should check your skin for signs of skin cancer at every physical exam. You should check your skin every month and tell your health care provider right away if anything looks unusual. Women with a higher-than-normal risk for skin cancer may want to see a skin specialist (dermatologist) for an annual body check. What are the benefits of screening? Cancer screening is done to look for cancer in the very early stages, before it spreads and becomes harder to treat and before you would start to notice symptoms. Finding cancer early improves the chances of successful treatment. It may save your life. Where to find more information  American Cancer Society: www.cancer.org  Centers for Disease Control and Prevention: FootballExhibition.com.br  National Cancer Institute: www.cancer.gov  U.S. Department of Health and Human Services: http://hoffman.com/ Contact a health care provider if: You have concerns about any signs or symptoms of  cancer. These may include:  Skin problems. You may have: ? Moles of an unusual shape or color. ? Changes in existing moles. ? A sore on your skin that does not heal.  Tiredness (fatigue) that does not go away.  Losing weight without trying.  Blood in your urine or stool.  Problems with coughing or breathing. These may include: ? Coughing or trouble breathing that does not go away. ? Coughing up blood.  Lumps or other changes in your breasts.  Vaginal bleeding, spotting, or changes in your periods.  Frequent pain or cramping in your abdomen. Summary  Your health care provider will recommend specific cancer screenings based on your age, medical  history, and family history of cancer.  Work with your health care provider to create a cancer screening schedule that protects your health.  Finding cancer early improves the chances of successful treatment. It may save your life.  Contact a health care provider if you have concerns about any signs or symptoms of cancer. This information is not intended to replace advice given to you by your health care provider. Make sure you discuss any questions you have with your health care provider. Document Revised: 01/30/2020 Document Reviewed: 08/05/2019 Elsevier Patient Education  2021 [employer].

## 2020-11-09 ENCOUNTER — Ambulatory Visit: Payer: Medicare Other

## 2020-12-19 ENCOUNTER — Other Ambulatory Visit: Payer: Self-pay

## 2020-12-19 ENCOUNTER — Ambulatory Visit
Admission: RE | Admit: 2020-12-19 | Discharge: 2020-12-19 | Disposition: A | Discharge status: Home / Self Care | Payer: Medicare HMO | Source: Ambulatory Visit | Attending: Family Medicine | Admitting: Family Medicine

## 2020-12-19 DIAGNOSIS — Z1231 Encounter for screening mammogram for malignant neoplasm of breast: Secondary | ICD-10-CM | POA: Diagnosis not present

## 2020-12-25 ENCOUNTER — Ambulatory Visit: Payer: Medicare HMO | Admitting: Family Medicine

## 2020-12-28 ENCOUNTER — Telehealth: Payer: Self-pay | Admitting: Family Medicine

## 2020-12-28 NOTE — Telephone Encounter (Signed)
Left message for patient to call back and schedule Medicare Annual Wellness Visit (AWV) either virtually or in office. No detailed message left   Last AWV 09/01/2019  please schedule at anytime with [facility] Nurse Health Advisor 1 or 2   This should be a 45 minute visit. 

## 2021-01-01 DIAGNOSIS — R32 Unspecified urinary incontinence: Secondary | ICD-10-CM | POA: Diagnosis not present

## 2021-01-01 DIAGNOSIS — M8580 Other specified disorders of bone density and structure, unspecified site: Secondary | ICD-10-CM | POA: Diagnosis not present

## 2021-01-01 DIAGNOSIS — Z823 Family history of stroke: Secondary | ICD-10-CM | POA: Diagnosis not present

## 2021-01-01 DIAGNOSIS — G8929 Other chronic pain: Secondary | ICD-10-CM | POA: Diagnosis not present

## 2021-01-01 DIAGNOSIS — E785 Hyperlipidemia, unspecified: Secondary | ICD-10-CM | POA: Diagnosis not present

## 2021-01-01 DIAGNOSIS — Z791 Long term (current) use of non-steroidal anti-inflammatories (NSAID): Secondary | ICD-10-CM | POA: Diagnosis not present

## 2021-01-01 DIAGNOSIS — Z8249 Family history of ischemic heart disease and other diseases of the circulatory system: Secondary | ICD-10-CM | POA: Diagnosis not present

## 2021-01-01 DIAGNOSIS — G20 Parkinson's disease: Secondary | ICD-10-CM | POA: Diagnosis not present

## 2021-01-01 DIAGNOSIS — Z7982 Long term (current) use of aspirin: Secondary | ICD-10-CM | POA: Diagnosis not present

## 2021-01-01 NOTE — Progress Notes (Signed)
Assessment/Plan:   1.  Parkinsons Disease  -Discussed newer data that suggest that we should start levodopa early on in patients disease course.  After quite some discussion about r/b/se and the data, we decided to trial the medication.  We will start carbidopa/levodopa 25/100 and slowly work up to tid dosing.    -We discussed that it used to be thought that levodopa would increase risk of melanoma but now it is believed that Parkinsons itself likely increases risk of melanoma. she is to get regular skin checks.  -congratulated her on the exercise!  -Talked to her about my new Child psychotherapist and had her meet her in the office today.  Introduced services.  2.  Sciatica/low back pain  -Following with Dr. Katrinka Blazing.  Treatment with OMM Subjective:   Jennifer Lutz was seen today in follow up for Parkinsons disease.  My previous records were reviewed prior to todays visit as well as outside records available to me. Pt denies falls.  Pt denies lightheadedness, near syncope.  No hallucinations.  Mood has been good.  Last visit, the patient was complaining about sciatica.  She was referred to Dr. Katrinka Blazing.  She has seen him a number of times since that time and I have reviewed the records.  Her sciatica pain has resolved.  She is exercising a lot.  She does 30 min of cardio, including 10 min of running.  She then does 25 min bike with RPM>80 and does all sorts of weights and core exercises.  Current prescribed movement disorder medications: None, (patient's choice)   ALLERGIES:  No Known Allergies  CURRENT MEDICATIONS:  Outpatient Encounter Medications as of 01/02/2021  Medication Sig  . aspirin 81 MG tablet Take 81 mg by mouth daily.  . carbidopa-levodopa (SINEMET IR) 25-100 MG tablet Take 1 tablet by mouth 3 (three) times daily. 7am/11am/4pm  . magnesium 30 MG tablet Take 30 mg by mouth daily.  . Omega-3 Fatty Acids (OMEGA-3 FISH OIL) 500 MG CAPS Take 50 mg by mouth.  Marland Kitchen OVER THE COUNTER  MEDICATION Calcium with D3 500 mg  . TART CHERRY PO Take 1 tablet by mouth daily.  . Turmeric (QC TUMERIC COMPLEX PO) Take 1 tablet by mouth daily.  Marland Kitchen VITAMIN D PO Take by mouth.   No facility-administered encounter medications on file as of 01/02/2021.    Objective:   PHYSICAL EXAMINATION:    VITALS:   Vitals:   01/02/21 1111  BP: (!) 142/72  Pulse: 64  SpO2: 98%  Weight: 98 lb (44.5 kg)  Height: 4' 10.25" (1.48 m)    GEN:  The patient appears stated age and is in NAD. HEENT:  Normocephalic, atraumatic.  The mucous membranes are moist.   Neurological examination:  Orientation: The patient is alert and oriented x3. Cranial nerves: There is good facial symmetry with facial hypomimia. The speech is fluent and clear. Soft palate rises symmetrically and there is no tongue deviation. Hearing is intact to conversational tone. Sensation: Sensation is intact to light touch throughout Motor: Strength is at least antigravity x4.  Movement examination: Tone: There is mild increased tone in the RUE Abnormal movements: there is RUE/RLE rest tremor Coordination:  There is mild decremation with RAM's, with finger taps on the right and the action of "turning in a light bulb" on the right.  Other rapid alternating movements are good bilaterally. Gait and Station: The patient has no difficulty arising out of a deep-seated chair without the use of the  hands. The patient's stride length is good with just slightly decreased arm swing on the right compared to that of the left (but it really is pretty good bilaterally).  The patient has a negative pull test.     I have reviewed and interpreted the following labs independently    Chemistry      Component Value Date/Time   NA 139 10/31/2020 0950   K 4.2 10/31/2020 0950   CL 100 10/31/2020 0950   CO2 31 10/31/2020 0950   BUN 15 10/31/2020 0950   CREATININE 1.10 10/31/2020 0950      Component Value Date/Time   CALCIUM 10.0 10/31/2020 0950    ALKPHOS 48 09/07/2017 1608   AST 21 09/07/2017 1608   ALT 13 09/07/2017 1608   BILITOT 0.5 09/07/2017 1608       Lab Results  Component Value Date   WBC 4.5 10/31/2020   HGB 14.2 10/31/2020   HCT 41.9 10/31/2020   MCV 97.1 10/31/2020   PLT 228.0 10/31/2020    Lab Results  Component Value Date   TSH 2.62 10/31/2020     Total time spent on today's visit was 30 minutes, including both face-to-face time and nonface-to-face time.  Time included that spent on review of records (prior notes available to me/labs/imaging if pertinent), discussing treatment and goals, answering patient's questions and coordinating care.  Cc:  Deeann Saint, MD

## 2021-01-02 ENCOUNTER — Encounter: Payer: Self-pay | Admitting: Neurology

## 2021-01-02 ENCOUNTER — Ambulatory Visit: Payer: Medicare HMO | Admitting: Neurology

## 2021-01-02 ENCOUNTER — Other Ambulatory Visit: Payer: Self-pay

## 2021-01-02 VITALS — BP 142/72 | HR 64 | Ht 58.25 in | Wt 98.0 lb

## 2021-01-02 DIAGNOSIS — G20 Parkinson's disease: Secondary | ICD-10-CM | POA: Diagnosis not present

## 2021-01-02 MED ORDER — CARBIDOPA-LEVODOPA 25-100 MG PO TABS: 1.0000 | ORAL_TABLET | Freq: Three times a day (TID) | ORAL | 1 refills | Status: DC

## 2021-01-02 NOTE — Patient Instructions (Addendum)
As we discussed, it used to be thought that levodopa would increase risk of melanoma but now it is believed that Parkinsons itself likely increases risk of melanoma. I recommend yearly skin checks with a board certified dermatologist.  You can call [facility] or [facility] for an appointment.  [facility] Address: [address] [place], [place], [place] [address] Phone: [phone]  [facility] [address] [place], [place], [place] Phone: [phone]  Start Carbidopa Levodopa as follows:  Take 1/2 tablet three times daily, at least 30 minutes before meals (approximately 7am/11am/4pm), for one week  Then take 1/2 tablet in the morning, 1/2 tablet in the afternoon, 1 tablet in the evening, at least 30 minutes before meals, for one week  Then take 1/2 tablet in the morning, 1 tablet in the afternoon, 1 tablet in the evening, at least 30 minutes before meals, for one week  Then take 1 tablet three times daily at 7am/11am/4pm, at least 30 minutes before meals   As a reminder, carbidopa/levodopa can be taken at the same time as a carbohydrate, but we like to have you take your pill either 30 minutes before a protein source or 1 hour after as protein can interfere with carbidopa/levodopa absorption.  Online Resources for Power over Parkinson's Group March 2022  . Local [place] Online Groups  o Power over [employer] Group :   - Power Over Parkinson's Patient Education Group will be Wednesday, March 9th at 2pm via Zoom.   - Upcoming Power over Parkinson's Meetings:  2nd Wednesdays of the month at 2 pm:       April 13th, May 11th - Contact Amy Marriott at [email or web address] if interested in participating in this online group o Parkinson's Care Partners Group:    3rd Mondays, Contact Lynwood Dawley o Atypical Parkinsonian Patient Group:   4th Wednesdays, Contact Lynwood Dawley o If you are interested in  participating in these online groups with Maralyn Sago, please contact her directly for how to join those meetings.  Her contact information is [email or web address].  She will send you a link to join the [employer].  (Please note that Lynwood Dawley , MSW, LCSW, has resigned her position at [employer] [facility], but will continue to lead the online groups temporarily)  . Parkinson Foundation:  www.[email or web address].[email or web address] Scull o PD Health at Home continues:  Mindfulness Mondays, Expert Briefing Tuesdays, Wellness Wednesdays, Take Time Thursdays, Fitness Fridays -Listings for March 2022 are on the website o Upcoming Webinar:  Conversations about Complementary Therapies and PD.  Wednesday, March 2nd @ 1 pm o Upcoming Webinar:  Can We Put the Brakes on PD Progression?  Wednesday, April 6th @ 1 pm o Electronics engineer) at [email or web address] o  Please check out their website to sign up for emails and see their full online offerings  . [employer] Foundation:  www.[email or web address].org  o Upcoming Webinar:   Trouble Sleeping?  What to Know about Acting out Dreams and other Sleep Issues.  Thursday, March 17th @ 12 noon o Check out additional information on their website to see their full online offerings  . March 19 Foundation:  www.davisphinneyfoundation.org o Upcoming Webinar:  Women and Parkinson's.  Tuesday, March 8th at 2 pm o Care Partner Monthly Meetup.  With 06-08-1975 [place].  First Tuesday of each month, 2 pm o Check out additional information to Live Well Today on their website  . Parkinson and Movement Disorders (PMD) Alliance:  www.pmdalliance.org o [employer] Online:  Online Education Events o Sign up for emails, which are sent weekly to give you updates on programming and online offerings   . Parkinson's Association of the [place]:  www.parkinsonassociation.org o Information on online support groups, education events, and online exercises including Yoga,  Parkinson's exercises and more-LOTS of information on links to PD resources and online events o Virtual Support Group through Parkinson's Association of the [place]; next one is scheduled for Wednesday, December 26, 2020 at 2 pm. (These are typically scheduled for the 1st Wednesday of the month at 2 pm).  Visit website for details.  . Additional links for movement activities: o PWR! Moves Classes at [facility] Exercise Room HAVE RESUMED!  Wednesdays 10 and 11 am.  Contact Amy Marriott, PT [email or web address] or [phone] if interested o Here is a link to the PWR!Moves classes on Zoom from [place] - Daily Mon-Sat at 10:00. Via Zoom, FREE and open to all.  There is also a link below via Facebook if you use that platform. - [email or web address].is - [email or web address] o Parkinson's Wellness Recovery (PWR! Moves)  www.pwr4life.org - Info on the PWR! Virtual Experience:  You will have access to our expertise through self-assessment, guided plans that start with the PD-specific fundamentals, educational content, tips, Q&A with an expert, and a growing Engineering geologist of PD-specific pre-recorded and live exercise classes of varying types and intensity - both physical and cognitive! If that is not enough, we offer 1:1 wellness consultations (in-person or virtual) to personalize your PWR! Dance movement psychotherapist.  - Check out the PWR! Move of the month on the Parkinson Wellness Recovery website:  [email or web address].de o [employer]  Fridays:  - As part of the PD Health @ Home program, this free video series focuses each week on one aspect of fitness designed to support people living with Parkinson's.  These weekly videos highlight the Parkinson Foundation recent fitness  guidelines for people with Parkinson's disease. -  http://www.morris.com/ o Dance for PD website is offering free, live-stream classes throughout the week, as well as links to [employer] of classes:  https://danceforparkinsons.[email or web address]/ o Dance for Parkinson's Class:  Dance Project of [place].  Free offering for people with Parkinson's and care partners; virtual class.  o For more information, contact [phone] or email Allena Napoleon at [email or web address] o Virtual dance and Pilates for Parkinson's classes: Click on the Community Tab> Parkinson's Movement Initiative Tab.  To register for classes and for more information, visit www.[facility] and click the "community" tab.     o [employer] Parkinson's Cycling Classes  - [facility] [employer]: 1pm on Fridays-Live classes at [facility] at [email or web address] or [phone]) [phone] [employer]: Virtual Classes Mondays and Thursdays (contact Venus at [email or web address] or [phone])   o [phone] - Three levels of classes are offered Tuesdays and Thursdays:  10:30 am,  12 noon & 1:45 pm at [facility] [place].  - Active Stretching with [facility] Class starting in March, on Fridays - To observe a class or for  more information, call [phone] or email [email or web address] . [employer]: o [phone] Opportunities:  www.well-springsolutions.org/caregiver-education/caregiver-support-group.  You may also contact Financial trader at [email or web address] or [phone].   o Virtual Caregiver Retreat, Monday, February 21st, 3-5 pm o Powerful Tools for Caregivers, 6-wk series for caregivers, in-person, Thursdays March 10, 17, 24, 31 and April 7, 14, from 10:30-12:30 at 32, [phone] 01-04-1979.  Contact [employer] (see above) to  register o Well-Spring Navigator:  Just1Navigator program,  a free service to help individuals and families through the journey of determining care for older adults.  The "Navigator" is a Child psychotherapist, Sidney Ace, who will speak with a prospective client and/or loved ones to provide an assessment of the situation and a set of recommendations for a personalized care plan -- all free of charge, and whether [facility] offers the needed service or not. If the need is not a service we provide, we are well-connected with reputable programs in town that we can refer you to.  www.well-springsolutions.org or to speak with the Navigator, call [phone].

## 2021-01-22 ENCOUNTER — Encounter: Payer: Self-pay | Admitting: Family Medicine

## 2021-01-22 ENCOUNTER — Other Ambulatory Visit: Payer: Self-pay

## 2021-01-22 ENCOUNTER — Ambulatory Visit: Payer: Medicare HMO | Admitting: Family Medicine

## 2021-01-22 VITALS — BP 128/88 | HR 82 | Ht <= 58 in | Wt 99.0 lb

## 2021-01-22 DIAGNOSIS — M9902 Segmental and somatic dysfunction of thoracic region: Secondary | ICD-10-CM

## 2021-01-22 DIAGNOSIS — G20 Parkinson's disease: Secondary | ICD-10-CM

## 2021-01-22 DIAGNOSIS — M9904 Segmental and somatic dysfunction of sacral region: Secondary | ICD-10-CM

## 2021-01-22 DIAGNOSIS — M9903 Segmental and somatic dysfunction of lumbar region: Secondary | ICD-10-CM | POA: Diagnosis not present

## 2021-01-22 DIAGNOSIS — M5441 Lumbago with sciatica, right side: Secondary | ICD-10-CM | POA: Diagnosis not present

## 2021-01-22 DIAGNOSIS — G8929 Other chronic pain: Secondary | ICD-10-CM

## 2021-01-22 NOTE — Assessment & Plan Note (Signed)
Patient is doing significantly better at this time.  Encourage patient to continue to do what she is doing.  Patient has great strength.  Given patient's resting tremor seems to be a little bit better.  Continue to the active.  Follow-up again in 3 to 4 months

## 2021-01-22 NOTE — Progress Notes (Signed)
  [name] [address] [place] [address] Phone: [phone] Subjective:   Bruce Donath, am serving as a scribe for Dr. Antoine Primas. This visit occurred during the SARS-CoV-2 public health emergency.  Safety protocols were in place, including screening questions prior to the visit, additional usage of staff PPE, and extensive cleaning of exam room while observing appropriate contact time as indicated for disinfecting solutions.   I'm seeing this patient by the request  of:  Deeann Saint, MD  CC: Low back pain follow-up  LOV:[id number]  Jennifer Lutz is a 70 y.o. female coming in with complaint of back and neck pain. OMT 11/19/2020. Patient states that she has been going to gym 1.5 hours, 3x a w  eek. Pain is much less in back and hips. Feels exercises we gave her have helped.denies any new symptoms has been able to play golf regularly.  Medications patient has been prescribed: None          Past medical history, social, surgical and family history all reviewed in electronic medical record.  No pertanent information unless stated regarding to the chief complaint.   Past Medical History:  Diagnosis Date  . Complication of anesthesia    'takes very little.' 'Hard to wake up'  . Degenerative disc disease, lumbar   . Medical history non-contributory     No Known Allergies   Review of Systems:  No headache, visual changes, nausea, vomiting, diarrhea, constipation, dizziness, abdominal pain, skin rash, fevers, chills, night sweats, weight loss, swollen lymph nodes, body aches, joint swelling, chest pain, shortness of breath, mood changes.  Objective  Blood pressure 128/88, pulse 82, height 4\' 10"  (1.473 m), weight 99 lb (44.9 kg), SpO2 99 %.   General: No apparent distress alert and oriented x3 mood and affect normal, dressed appropriately.  HEENT: Pupils equal, extraocular movements intact  Respiratory: Patient's speak in  full sentences and does not appear short of breath  Cardiovascular: No lower extremity edema, non tender, no erythema  Mild resting tremor still noted.  Patient does seems to be doing better.  Still has some mild hypertonicity noted.  Lower back has tightness but still improvement in range of motion.  Negative straight leg test.  Osteopathic findings  T9 extended rotated and side bent left L2 flexed rotated and side bent right Sacrum right on right       Assessment and Plan:  Low back pain Patient is doing significantly better at this time.  Encourage patient to continue to do what she is doing.  Patient has great strength.  Given patient's resting tremor seems to be a little bit better.  Continue to the active.  Follow-up again in 3 to 4 months    Nonallopathic problems  Decision today to treat with OMT was based on Physical Exam  After verbal consent patient was treated with HVLA, ME, FPR techniques in thoracic, lumbar, and sacral  areas  Patient tolerated the procedure well with improvement in symptoms  Patient given exercises, stretches and lifestyle modifications  See medications in patient instructions if given  Patient will follow up in 12 weeks     The above documentation has been reviewed and is accurate and complete , DO       Note: This dictation was prepared with Dragon dictation along with smaller phrase technology. Any transcriptional errors that result from this process are unintentional.

## 2021-01-22 NOTE — Patient Instructions (Signed)
Be proud of yourself Don't change a thing See me in 3-4 months

## 2021-03-07 ENCOUNTER — Telehealth: Payer: Self-pay | Admitting: Family Medicine

## 2021-03-07 NOTE — Telephone Encounter (Signed)
Left message for patient to call back and schedule Medicare Annual Wellness Visit (AWV) either virtually or in office.   Last AWV 09/01/2019  please schedule at anytime with [facility] Nurse Health Advisor 1 or 2   This should be a 45 minute visit.

## 2021-03-22 ENCOUNTER — Ambulatory Visit: Payer: Medicare HMO

## 2021-03-29 ENCOUNTER — Ambulatory Visit (INDEPENDENT_AMBULATORY_CARE_PROVIDER_SITE_OTHER): Payer: Medicare HMO

## 2021-03-29 ENCOUNTER — Other Ambulatory Visit: Payer: Self-pay

## 2021-03-29 VITALS — BP 124/70 | HR 70 | Temp 98.0°F | Ht 59.0 in | Wt 95.0 lb

## 2021-03-29 DIAGNOSIS — Z1211 Encounter for screening for malignant neoplasm of colon: Secondary | ICD-10-CM | POA: Diagnosis not present

## 2021-03-29 DIAGNOSIS — Z0000 Encounter for general adult medical examination without abnormal findings: Secondary | ICD-10-CM | POA: Diagnosis not present

## 2021-03-29 NOTE — Progress Notes (Signed)
Subjective:   Jennifer Lutz is a 70 y.o. female who presents for Medicare Annual (Subsequent) preventive examination.  Review of Systems    N/a       Objective:    There were no vitals filed for this visit. There is no height or weight on file to calculate BMI.  Advanced Directives 01/02/2021 07/03/2020 04/20/2018 10/03/2016 06/07/2015 05/31/2015  Does Patient Have a Medical Advance Directive? Yes Yes Yes No No No  Type of Estate agent of [place];Living will Healthcare Power of [place];Living will - - - -    Current Medications (verified) Outpatient Encounter Medications as of 03/29/2021  Medication Sig   aspirin 81 MG tablet Take 81 mg by mouth daily.   carbidopa-levodopa (SINEMET IR) 25-100 MG tablet Take 1 tablet by mouth 3 (three) times daily. 7am/11am/4pm   magnesium 30 MG tablet Take 30 mg by mouth daily.   Omega-3 Fatty Acids (OMEGA-3 FISH OIL) 500 MG CAPS Take 50 mg by mouth.   OVER THE COUNTER MEDICATION Calcium with D3 500 mg   TART CHERRY PO Take 1 tablet by mouth daily.   Turmeric (QC TUMERIC COMPLEX PO) Take 1 tablet by mouth daily.   VITAMIN D PO Take by mouth.   No facility-administered encounter medications on file as of 03/29/2021.    Allergies (verified) Patient has no known allergies.   History: Past Medical History:  Diagnosis Date   Complication of anesthesia    'takes very little.' 'Hard to wake up'   Degenerative disc disease, lumbar    Medical history non-contributory    Past Surgical History:  Procedure Laterality Date   NASAL SEPTUM SURGERY  1985   WRIST ARTHROSCOPY WITH ULNA SHORTENING Left 06/07/2015   Procedure: ARTHROSCOPY LEFT WRIST DEBRIDEMENT ULNAR SHORTENING;  Surgeon: Cindee Salt, MD;  Location: [facility];  Service: Orthopedics;  Laterality: Left;   WRIST OSTEOTOMY Left 06/07/2015   Procedure: LEFT OSTEOTOMY;  Surgeon: Cindee Salt, MD;  Location: [facility];  Service: Orthopedics;   Laterality: Left;   Family History  Problem Relation Age of Onset   Stroke Other    Stroke Mother    Social History   Socioeconomic History   Marital status: Married    Spouse name: Not on file   Number of children: Not on file   Years of education: Not on file   Highest education level: Not on file  Occupational History   Not on file  Tobacco Use   Smoking status: Never   Smokeless tobacco: Never  Vaping Use   Vaping Use: Never used  Substance and Sexual Activity   Alcohol use: Yes    Comment: 2 glasses qd   Drug use: No   Sexual activity: Not on file  Other Topics Concern   Not on file  Social History Narrative   Not on file   Social Determinants of Health   Financial Resource Strain: Not on file  Food Insecurity: Not on file  Transportation Needs: Not on file  Physical Activity: Not on file  Stress: Not on file  Social Connections: Not on file    Tobacco Counseling Counseling given: Not Answered   Clinical Intake:                 Diabetic?no         Activities of Daily Living No flowsheet data found.  Patient Care Team: Deeann Saint, MD as PCP - General (Family Medicine) Tat, Lurena Joiner  S, DO as Consulting Physician (Neurology)  Indicate any recent Medical Services you may have received from other than [facility] providers in the past year (date may be approximate).     Assessment:   This is a routine wellness examination for Jennifer Lutz.  Hearing/Vision screen No results found.  Dietary issues and exercise activities discussed:     Goals Addressed   None    Depression Screen PHQ 2/9 Scores 10/31/2020 09/01/2019 04/20/2018 10/03/2016 07/29/2016 01/30/2015 12/26/2013  PHQ - 2 Score 0 0 0 0 0 0 0    Fall Risk Fall Risk  01/02/2021 10/31/2020 07/03/2020 09/01/2019 08/15/2019  Falls in the past year? 0 0 0 1 1  Comment - - - - Emmi Telephone Survey: data to providers prior to load  Number falls in past yr: 0 - 0 0 1  Comment - - - - Emmi  Telephone Survey Actual Response = 1  Injury with Fall? 0 - 0 1 1  Follow up - - - Falls evaluation completed -    FALL RISK PREVENTION PERTAINING TO THE HOME:  Any stairs in or around the home? No  If so, are there any without handrails? No  Home free of loose throw rugs in walkways, pet beds, electrical cords, etc? Yes  Adequate lighting in your home to reduce risk of falls? Yes   ASSISTIVE DEVICES UTILIZED TO PREVENT FALLS:  Life alert? No  Use of a cane, walker or w/c? No  Grab bars in the bathroom? No  Shower chair or bench in shower? Yes  Elevated toilet seat or a handicapped toilet? Yes   TIMED UP AND GO:  Was the test performed? Yes .  Length of time to ambulate 10 feet: 8 sec.   Gait steady and fast without use of assistive device  Cognitive Function: MMSE - Mini Mental State Exam 04/20/2018 10/03/2016  Not completed: (No Data) (No Data)        Immunizations Immunization History  Administered Date(s) Administered   Fluad Quad(high Dose 65+) 09/01/2019, 10/31/2020   Influenza Whole 07/08/2007, 10/16/2009   Influenza, High Dose Seasonal PF 07/29/2016, 09/07/2017   Moderna Sars-Covid-2 Vaccination 02/02/2020, 03/08/2020, 09/19/2020   Pneumococcal Conjugate-13 01/30/2015   Pneumococcal Polysaccharide-23 12/26/2013   Td 09/22/1997, 09/29/2008   Tetanus 12/26/2013   Zoster Recombinat (Shingrix) 10/15/2019   Zoster, Live 03/16/2015    TDAP status: Up to date  Flu Vaccine status: Up to date  Pneumococcal vaccine status: Up to date  Covid-19 vaccine status: Completed vaccines  Qualifies for Shingles Vaccine? Yes   Zostavax completed Yes   Shingrix Completed?: Yes  Screening Tests Health Maintenance  Topic Date Due   Hepatitis C Screening  Never done   COLONOSCOPY (Pts 45-17yrs Insurance coverage will need to be confirmed)  09/22/2018   Zoster Vaccines- Shingrix (2 of 2) 12/10/2019   COVID-19 Vaccine (4 - Booster for Moderna series) 01/18/2021    INFLUENZA VACCINE  04/22/2021   MAMMOGRAM  12/20/2022   TETANUS/TDAP  12/27/2023   DEXA SCAN  Completed   PNA vac Low Risk Adult  Completed   HPV VACCINES  Aged Out    Health Maintenance  Health Maintenance Due  Topic Date Due   Hepatitis C Screening  Never done   COLONOSCOPY (Pts 45-31yrs Insurance coverage will need to be confirmed)  09/22/2018   Zoster Vaccines- Shingrix (2 of 2) 12/10/2019   COVID-19 Vaccine (4 - Booster for Moderna series) 01/18/2021    Colorectal cancer screening: Referral to  GI placed 03/29/2021. Pt aware the office will call re: appt.  Mammogram status: Completed 12/19/2020. Repeat every year  Bone Density status: Ordered 10/31/2020. Pt provided with contact info and advised to call to schedule appt.  Lung Cancer Screening: (Low Dose CT Chest recommended if Age 87-80 years, 30 pack-year currently smoking OR have quit w/in 15years.) does not qualify.   Lung Cancer Screening Referral: n/a  Additional Screening:  Hepatitis C Screening: does qualify  Vision Screening: Recommended annual ophthalmology exams for early detection of glaucoma and other disorders of the eye. Is the patient up to date with their annual eye exam?  Yes  Who is the provider or what is the name of the office in which the patient attends annual eye exams? Dr,Scott If pt is not established with a provider, would they like to be referred to a provider to establish care? No .   Dental Screening: Recommended annual dental exams for proper oral hygiene  Community Resource Referral / Chronic Care Management: CRR required this visit?  No   CCM required this visit?  No      Plan:     I have personally reviewed and noted the following in the patient's chart:   Medical and social history Use of alcohol, tobacco or illicit drugs  Current medications and supplements including opioid prescriptions.  Functional ability and status Nutritional status Physical activity Advanced  directives List of other physicians Hospitalizations, surgeries, and ER visits in previous 12 months Vitals Screenings to include cognitive, depression, and falls Referrals and appointments  In addition, I have reviewed and discussed with patient certain preventive protocols, quality metrics, and best practice recommendations. A written personalized care plan for preventive services as well as general preventive health recommendations were provided to patient.     March Rummage, LPN   [id number]   Nurse Notes: none

## 2021-03-29 NOTE — Patient Instructions (Signed)
Jennifer Lutz , Thank you for taking time to come for your Medicare Wellness Visit. I appreciate your ongoing commitment to your health goals. Please review the following plan we discussed and let me know if I can assist you in the future.   Screening recommendations/referrals: Colonoscopy: referral completed 03/29/2021 Mammogram: 12/19/2020 Bone Density: 10/31/2020 Recommended yearly ophthalmology/optometry visit for glaucoma screening and checkup Recommended yearly dental visit for hygiene and checkup  Vaccinations: Influenza vaccine: due in fall 2022 Pneumococcal vaccine: completed series Tdap vaccine: 07/06/2018 Shingles vaccine: completed series  Advanced directives: will provide copies   Conditions/risks identified: none   Next appointment: none    Preventive Care 65 Years and Older, Female Preventive care refers to lifestyle choices and visits with your health care provider that can promote health and wellness. What does preventive care include? A yearly physical exam. This is also called an annual well check. Dental exams once or twice a year. Routine eye exams. Ask your health care provider how often you should have your eyes checked. Personal lifestyle choices, including: Daily care of your teeth and gums. Regular physical activity. Eating a healthy diet. Avoiding tobacco and drug use. Limiting alcohol use. Practicing safe sex. Taking low-dose aspirin every day. Taking vitamin and mineral supplements as recommended by your health care provider. What happens during an annual well check? The services and screenings done by your health care provider during your annual well check will depend on your age, overall health, lifestyle risk factors, and family history of disease. Counseling  Your health care provider may ask you questions about your: Alcohol use. Tobacco use. Drug use. Emotional well-being. Home and relationship well-being. Sexual activity. Eating  habits. History of falls. Memory and ability to understand (cognition). Work and work Astronomer. Reproductive health. Screening  You may have the following tests or measurements: Height, weight, and BMI. Blood pressure. Lipid and cholesterol levels. These may be checked every 5 years, or more frequently if you are over 15 years old. Skin check. Lung cancer screening. You may have this screening every year starting at age 79 if you have a 30-pack-year history of smoking and currently smoke or have quit within the past 15 years. Fecal occult blood test (FOBT) of the stool. You may have this test every year starting at age 39. Flexible sigmoidoscopy or colonoscopy. You may have a sigmoidoscopy every 5 years or a colonoscopy every 10 years starting at age 17. Hepatitis C blood test. Hepatitis B blood test. Sexually transmitted disease (STD) testing. Diabetes screening. This is done by checking your blood sugar (glucose) after you have not eaten for a while (fasting). You may have this done every 1-3 years. Bone density scan. This is done to screen for osteoporosis. You may have this done starting at age 28. Mammogram. This may be done every 1-2 years. Talk to your health care provider about how often you should have regular mammograms. Talk with your health care provider about your test results, treatment options, and if necessary, the need for more tests. Vaccines  Your health care provider may recommend certain vaccines, such as: Influenza vaccine. This is recommended every year. Tetanus, diphtheria, and acellular pertussis (Tdap, Td) vaccine. You may need a Td booster every 10 years. Zoster vaccine. You may need this after age 2. Pneumococcal 13-valent conjugate (PCV13) vaccine. One dose is recommended after age 41. Pneumococcal polysaccharide (PPSV23) vaccine. One dose is recommended after age 11. Talk to your health care provider about which screenings and vaccines you need  and how  often you need them. This information is not intended to replace advice given to you by your health care provider. Make sure you discuss any questions you have with your health care provider. Document Released: 10/05/2015 Document Revised: 05/28/2016 Document Reviewed: 07/10/2015 Elsevier Interactive Patient Education  2017 [place] Prevention in the Home Falls can cause injuries. They can happen to people of all ages. There are many things you can do to make your home safe and to help prevent falls. What can I do on the outside of my home? Regularly fix the edges of walkways and driveways and fix any cracks. Remove anything that might make you trip as you walk through a door, such as a raised step or threshold. Trim any bushes or trees on the path to your home. Use bright outdoor lighting. Clear any walking paths of anything that might make someone trip, such as rocks or tools. Regularly check to see if handrails are loose or broken. Make sure that both sides of any steps have handrails. Any raised decks and porches should have guardrails on the edges. Have any leaves, snow, or ice cleared regularly. Use sand or salt on walking paths during winter. Clean up any spills in your garage right away. This includes oil or grease spills. What can I do in the bathroom? Use night lights. Install grab bars by the toilet and in the tub and shower. Do not use towel bars as grab bars. Use non-skid mats or decals in the tub or shower. If you need to sit down in the shower, use a plastic, non-slip stool. Keep the floor dry. Clean up any water that spills on the floor as soon as it happens. Remove soap buildup in the tub or shower regularly. Attach bath mats securely with double-sided non-slip rug tape. Do not have throw rugs and other things on the floor that can make you trip. What can I do in the bedroom? Use night lights. Make sure that you have a light by your bed that is easy to  reach. Do not use any sheets or blankets that are too big for your bed. They should not hang down onto the floor. Have a firm chair that has side arms. You can use this for support while you get dressed. Do not have throw rugs and other things on the floor that can make you trip. What can I do in the kitchen? Clean up any spills right away. Avoid walking on wet floors. Keep items that you use a lot in easy-to-reach places. If you need to reach something above you, use a strong step stool that has a grab bar. Keep electrical cords out of the way. Do not use floor polish or wax that makes floors slippery. If you must use wax, use non-skid floor wax. Do not have throw rugs and other things on the floor that can make you trip. What can I do with my stairs? Do not leave any items on the stairs. Make sure that there are handrails on both sides of the stairs and use them. Fix handrails that are broken or loose. Make sure that handrails are as long as the stairways. Check any carpeting to make sure that it is firmly attached to the stairs. Fix any carpet that is loose or worn. Avoid having throw rugs at the top or bottom of the stairs. If you do have throw rugs, attach them to the floor with carpet tape. Make sure that you  have a light switch at the top of the stairs and the bottom of the stairs. If you do not have them, ask someone to add them for you. What else can I do to help prevent falls? Wear shoes that: Do not have high heels. Have rubber bottoms. Are comfortable and fit you well. Are closed at the toe. Do not wear sandals. If you use a stepladder: Make sure that it is fully opened. Do not climb a closed stepladder. Make sure that both sides of the stepladder are locked into place. Ask someone to hold it for you, if possible. Clearly mark and make sure that you can see: Any grab bars or handrails. First and last steps. Where the edge of each step is. Use tools that help you move  around (mobility aids) if they are needed. These include: Canes. Walkers. Scooters. Crutches. Turn on the lights when you go into a dark area. Replace any light bulbs as soon as they burn out. Set up your furniture so you have a clear path. Avoid moving your furniture around. If any of your floors are uneven, fix them. If there are any pets around you, be aware of where they are. Review your medicines with your doctor. Some medicines can make you feel dizzy. This can increase your chance of falling. Ask your doctor what other things that you can do to help prevent falls. This information is not intended to replace advice given to you by your health care provider. Make sure you discuss any questions you have with your health care provider. Document Released: 07/05/2009 Document Revised: 02/14/2016 Document Reviewed: 10/13/2014 Elsevier Interactive Patient Education  2017 [employer].

## 2021-04-08 ENCOUNTER — Telehealth: Payer: Self-pay | Admitting: Gastroenterology

## 2021-04-08 NOTE — Telephone Encounter (Signed)
Inbound call from pt requesting a call back asking if she can get a cologuard instead on colonoscopy. Please advise. Thanks.

## 2021-04-08 NOTE — Telephone Encounter (Signed)
The pt has a history of colon polyps- she has been advised that she is not a candidate for cologuard and will call to make an appt for colonoscopy.Jennifer Lutz She was transferred to the schedulers to set up.

## 2021-04-09 ENCOUNTER — Encounter: Payer: Self-pay | Admitting: Gastroenterology

## 2021-04-11 ENCOUNTER — Other Ambulatory Visit: Payer: Self-pay

## 2021-04-12 ENCOUNTER — Ambulatory Visit (INDEPENDENT_AMBULATORY_CARE_PROVIDER_SITE_OTHER): Payer: Medicare HMO | Admitting: Family Medicine

## 2021-04-12 ENCOUNTER — Encounter: Payer: Self-pay | Admitting: Family Medicine

## 2021-04-12 VITALS — BP 104/62 | HR 60 | Temp 97.7°F | Wt 94.6 lb

## 2021-04-12 DIAGNOSIS — G20 Parkinson's disease: Secondary | ICD-10-CM

## 2021-04-12 DIAGNOSIS — Z1159 Encounter for screening for other viral diseases: Secondary | ICD-10-CM | POA: Diagnosis not present

## 2021-04-12 DIAGNOSIS — G20A1 Parkinson's disease without dyskinesia, without mention of fluctuations: Secondary | ICD-10-CM

## 2021-04-12 DIAGNOSIS — E782 Mixed hyperlipidemia: Secondary | ICD-10-CM | POA: Diagnosis not present

## 2021-04-12 LAB — COMPREHENSIVE METABOLIC PANEL
ALT: 2 U/L (ref 0–35)
AST: 20 U/L (ref 0–37)
Albumin: 4.7 g/dL (ref 3.5–5.2)
Alkaline Phosphatase: 60 U/L (ref 39–117)
BUN: 26 mg/dL — ABNORMAL HIGH (ref 6–23)
CO2: 27 mEq/L (ref 19–32)
Calcium: 9.6 mg/dL (ref 8.4–10.5)
Chloride: 103 mEq/L (ref 96–112)
Creatinine, Ser: 1.09 mg/dL (ref 0.40–1.20)
GFR: 50.78 mL/min — ABNORMAL LOW (ref >60.00)
Glucose, Bld: 81 mg/dL (ref 70–99)
Potassium: 4.3 mEq/L (ref 3.5–5.1)
Sodium: 140 mEq/L (ref 135–145)
Total Bilirubin: 1 mg/dL (ref 0.2–1.2)
Total Protein: 7.1 g/dL (ref 6.0–8.3)

## 2021-04-12 LAB — LIPID PANEL
Cholesterol: 295 mg/dL — ABNORMAL HIGH (ref 0–200)
HDL: 126.2 mg/dL (ref >39.00)
LDL Cholesterol: 153 mg/dL — ABNORMAL HIGH (ref 0–99)
NonHDL: 169.27
Total CHOL/HDL Ratio: 2
Triglycerides: 80 mg/dL (ref 0.0–149.0)
VLDL: 16 mg/dL (ref 0.0–40.0)

## 2021-04-12 NOTE — Progress Notes (Signed)
Subjective:    Patient ID: Jennifer Lutz, female    DOB: 30-Sep-1950, 70 y.o.   MRN: [id number]  Chief Complaint  Patient presents with   Follow-up    cholesterol    HPI Patient is a 70  yo female with pmh sig for HLD, Parkinson's disease who was seen today for f/u on cholesterol.  Pt cut out red meat and pork from diet.  Drinking a smoothie in the morning with kale, apple cider vinegar, oatmeal, turmeric, and other leafy greens.  Exercising regularly in the gym and playing golf at least 2 times per week.  Pt's mom and brother have h/o high cholesterol.  Pt does not like taking meds unless needed.  Inquires about kidney function.  Trying to drink at least 80 ounces of water per day  Pt started senimet TID for Parkinson's dz.  Notes improvement in handwriting, legible and slightly larger.  Having less tremor and rigidity.  Golf swing has improved.  Wearing long sleeves, hat, and sunscreen as ppl with PD are more susceptible to skin cancer.  Went to a PD support group.  Followed by Neurology, Dr. Arbutus Leas.  Has colonoscopy scheduled for October 2022.  Past Medical History:  Diagnosis Date   Complication of anesthesia    'takes very little.' 'Hard to wake up'   Degenerative disc disease, lumbar    Medical history non-contributory     No Known Allergies  ROS General: Denies fever, chills, night sweats, changes in weight, changes in appetite HEENT: Denies headaches, ear pain, changes in vision, rhinorrhea, sore throat CV: Denies CP, palpitations, SOB, orthopnea Pulm: Denies SOB, cough, wheezing GI: Denies abdominal pain, nausea, vomiting, diarrhea, constipation GU: Denies dysuria, hematuria, frequency, vaginal discharge Msk: Denies muscle cramps, joint pains Neuro: Denies weakness, numbness, tingling Skin: Denies rashes, bruising Psych: Denies depression, anxiety, hallucinations    Objective:    Blood pressure 104/62, pulse 60, temperature 97.7 F (36.5 C), temperature source  Oral, weight 94 lb 9.6 oz (42.9 kg), SpO2 99 %.  Gen. Pleasant, well-nourished, in no distress, normal affect   HEENT: /AT, face symmetric, conjunctiva clear, no scleral icterus, PERRLA, EOMI, nares patent without drainage Neck: No JVD, no thyromegaly, no carotid bruits Lungs: no accessory muscle use, CTAB, no wheezes or rales Cardiovascular: RRR, no m/r/g, no peripheral edema Musculoskeletal: No deformities, no cyanosis or clubbing, normal tone Neuro:  A&Ox3, CN II-XII intact, normal gait Skin:  Warm, no lesions/ rash   Wt Readings from Last 3 Encounters:  04/12/21 94 lb 9.6 oz (42.9 kg)  03/29/21 95 lb (43.1 kg)  01/22/21 99 lb (44.9 kg)    Lab Results  Component Value Date   WBC 4.5 10/31/2020   HGB 14.2 10/31/2020   HCT 41.9 10/31/2020   PLT 228.0 10/31/2020   GLUCOSE 88 10/31/2020   CHOL 322 (H) 10/31/2020   TRIG 103.0 10/31/2020   HDL 105.30 10/31/2020   LDLDIRECT 130.7 10/16/2009   LDLCALC 196 (H) 10/31/2020   ALT 13 09/07/2017   AST 21 09/07/2017   NA 139 10/31/2020   K 4.2 10/31/2020   CL 100 10/31/2020   CREATININE 1.10 10/31/2020   BUN 15 10/31/2020   CO2 31 10/31/2020   TSH 2.62 10/31/2020   HGBA1C 5.5 10/31/2020    Assessment/Plan:  Mixed hyperlipidemia  -Total cholesterol 322, HDL 105, LDL 196, triglycerides 103 on 10/31/2020 -Continue lifestyle modifications -Given handout -For continued elevation in cholesterol discuss statin - Plan: Lipid panel, CMP  Encounter for hepatitis  C screening test for low risk patient  - Plan: Hep C Antibody  Parkinson's disease (HCC) -Stable -Continue Sinemet 25-100 mg 3 times daily -Continue follow-up with neurology, Dr. Riley Lam.  Colonoscopy schedule for October 2022.  F/u prn in the next few months  Abbe Amsterdam, MD

## 2021-04-15 LAB — HEPATITIS C ANTIBODY
Hepatitis C Ab: NONREACTIVE
SIGNAL TO CUT-OFF: 0 (ref <1.00)

## 2021-04-16 ENCOUNTER — Ambulatory Visit: Payer: Medicare HMO | Admitting: Dermatology

## 2021-04-18 ENCOUNTER — Other Ambulatory Visit: Payer: Medicare HMO

## 2021-05-24 DIAGNOSIS — H5213 Myopia, bilateral: Secondary | ICD-10-CM | POA: Diagnosis not present

## 2021-05-29 NOTE — Progress Notes (Signed)
[name] [address] [place] [address] Phone: [phone] Subjective:   INadine Counts, am serving as a scribe for Dr. Antoine Primas. This visit occurred during the SARS-CoV-2 public health emergency.  Safety protocols were in place, including screening questions prior to the visit, additional usage of staff PPE, and extensive cleaning of exam room while observing appropriate contact time as indicated for disinfecting solutions.   I'm seeing this patient by the request  of:  Deeann Saint, MD  CC: Low back pain follow-up  KGY:[id number]  Rettie Deltha Bernales is a 70 y.o. female coming in with complaint of back and neck pain. OMT 01/22/2021. Patient states she has no new complaints and her back and neck pain remains unchanged.  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  Patient states she feels better when she is moving.  Has had only 2 flares where she has had some mild tightness on the right side.  Nothing severe though.  Continues to increase activity.  Not taking any medicine other than the over-the-counter medicines that have been suggested.  Medications patient has been prescribed: None          Past Medical History:  Diagnosis Date   Complication of anesthesia    'takes very little.' 'Hard to wake up'   Degenerative disc disease, lumbar    Medical history non-contributory     No Known Allergies   Review of Systems:  No headache, visual changes, nausea, vomiting, diarrhea, constipation, dizziness, abdominal pain, skin rash, fevers, chills, night sweats, weight loss, swollen lymph nodes, body aches, joint swelling, chest pain, shortness of breath, mood changes. POSITIVE muscle aches  Objective  Blood pressure 112/64, pulse 62, height 4\' 11"  (1.499 m), weight 93 lb (42.2 kg), SpO2 99 %.   General: No apparent distress alert and oriented x3 mood and affect  normal, dressed appropriately.  HEENT: Pupils equal, extraocular movements intact  Respiratory: Patient's speak in full sentences and does not appear short of breath  Cardiovascular: No lower extremity edema, non tender, no erythema  Low back exam tightness still noted in the paraspinal musculature.  Very mild tightness of the right sacroiliac joint compared to the contralateral side.  Osteopathic findings  T7 extended rotated and side bent left L3 flexed rotated and side bent right L5 flexed rotated and side bent left Sacrum right on right       Assessment and Plan:  Low back pain Patient is doing remarkably well.  Patient is doing better with core, stability, balance and coordination.  I believe the patient is doing very well and still has tightness that seems to be more secondary to the Parkinson's.  Patient is going to continue to be active and consistently to do relatively well.  Follow-up with me again in 6 to 8 weeks   Nonallopathic problems  Decision today to treat with OMT was based on Physical Exam  After verbal consent patient was treated with HVLA, ME, FPR techniques in   thoracic, lumbar, and sacral  areas  Patient tolerated the procedure well with improvement in symptoms  Patient given exercises, stretches and lifestyle modifications  See medications in patient instructions if given  Patient will follow up in 3 to 4 months     The above documentation has been reviewed and is accurate and complete , DO        Note:  This dictation was prepared with Dragon dictation along with smaller phrase technology. Any transcriptional errors that result from this process are unintentional.

## 2021-05-31 ENCOUNTER — Other Ambulatory Visit: Payer: Self-pay

## 2021-05-31 ENCOUNTER — Ambulatory Visit: Payer: Medicare HMO | Admitting: Family Medicine

## 2021-05-31 VITALS — BP 112/64 | HR 62 | Ht 59.0 in | Wt 93.0 lb

## 2021-05-31 DIAGNOSIS — M9904 Segmental and somatic dysfunction of sacral region: Secondary | ICD-10-CM | POA: Diagnosis not present

## 2021-05-31 DIAGNOSIS — M5441 Lumbago with sciatica, right side: Secondary | ICD-10-CM | POA: Diagnosis not present

## 2021-05-31 DIAGNOSIS — G20 Parkinson's disease: Secondary | ICD-10-CM | POA: Diagnosis not present

## 2021-05-31 DIAGNOSIS — G8929 Other chronic pain: Secondary | ICD-10-CM

## 2021-05-31 DIAGNOSIS — M9902 Segmental and somatic dysfunction of thoracic region: Secondary | ICD-10-CM | POA: Diagnosis not present

## 2021-05-31 DIAGNOSIS — M9903 Segmental and somatic dysfunction of lumbar region: Secondary | ICD-10-CM

## 2021-05-31 NOTE — Assessment & Plan Note (Signed)
Patient's balance and coordination seems to have significant improvement so far.  Very impressed with how well patient is doing.

## 2021-05-31 NOTE — Assessment & Plan Note (Addendum)
Patient is doing remarkably well.  Patient is doing better with core, stability, balance and coordination.  I believe the patient is doing very well and still has tightness that seems to be more secondary to the Parkinson's.  Patient is going to continue to be active and consistently to do relatively well.  Follow-up with me again in 3 to 4 months

## 2021-05-31 NOTE — Patient Instructions (Signed)
Be proud of the changes you've made!! See you again in 3 to 4 months

## 2021-06-04 ENCOUNTER — Other Ambulatory Visit: Payer: Self-pay

## 2021-06-04 ENCOUNTER — Ambulatory Visit: Payer: Medicare HMO | Admitting: Dermatology

## 2021-06-04 DIAGNOSIS — Z1283 Encounter for screening for malignant neoplasm of skin: Secondary | ICD-10-CM

## 2021-06-04 DIAGNOSIS — L821 Other seborrheic keratosis: Secondary | ICD-10-CM

## 2021-06-15 ENCOUNTER — Encounter: Payer: Self-pay | Admitting: Dermatology

## 2021-06-15 NOTE — Progress Notes (Signed)
   New Patient   Subjective  Jennifer Lutz is a 70 y.o. female who presents for the following: Annual Exam (Left lower leg x months- no itch no bleed).  General skin examination Location:  Duration:  Quality:  Associated Signs/Symptoms: Modifying Factors:  Severity:  Timing: Context:    The following portions of the chart were reviewed this encounter and updated as appropriate:  Tobacco  Allergies  Meds  Problems  Med Hx  Surg Hx  Fam Hx      Objective  Well appearing patient in no apparent distress; mood and affect are within normal limits. Torso - Posterior (Back) Full body skin check, many keratosis legs, arms, chest, back. Bunion on both feet. Angiomas on the abdomen . No atypial pigmented lesions or non-melanoma skin cancer.  Left Lower Leg - Posterior, Left Thigh - Anterior, Left Upper Arm - Posterior, Right Upper Arm - Posterior Multiple 3-15mm brown flat topped textured papules; all with typical dermoscopy    A full examination was performed including scalp, head, eyes, ears, nose, lips, neck, chest, axillae, abdomen, back, buttocks, bilateral upper extremities, bilateral lower extremities, hands, feet, fingers, toes, fingernails, and toenails. All findings within normal limits unless otherwise noted below.Areas beneath undergarments not fully examined.   Assessment & Plan  Screening exam for skin cancer Torso - Posterior (Back)  Keep yearly skin checks. Encouraged to self examine twice annually.  Seborrheic keratosis (4) Left Upper Arm - Posterior; Right Upper Arm - Posterior; Left Thigh - Anterior; Left Lower Leg - Posterior  May leave if stable

## 2021-06-27 ENCOUNTER — Other Ambulatory Visit: Payer: Self-pay | Admitting: Neurology

## 2021-06-27 DIAGNOSIS — G20 Parkinson's disease: Secondary | ICD-10-CM

## 2021-06-27 NOTE — Telephone Encounter (Signed)
Called patient and let her know prescription was sent to the [facility] on [address]. I sent 90 that should last until her upcoming appointment

## 2021-06-27 NOTE — Telephone Encounter (Signed)
Patient called and said she needs a refill on her carbidopa levodopa 25-100mg  to last until her appointment on 07/09/21.  She only has 5-6 days left at this point.  [facility] on [address]

## 2021-07-08 NOTE — Progress Notes (Signed)
Assessment/Plan:   1.  Parkinsons Disease  -Continue carbidopa/levodopa 25/100, 1 tablet 3 times per day.  She looks great today.  -She is following regularly with dermatology.  Last saw Dr. Jorja Loa on September 13.  2.  Sciatica/low back pain  -Following with Dr. Katrinka Blazing.  Treatment with OMM Subjective:   Jennifer Lutz was seen today in follow up for Parkinsons disease.  My previous records were reviewed prior to todays visit as well as outside records available to me.  Levodopa was started last visit.  She is tolerating the medication well.  She denies side effects.  She has had no falls.  She does continue to exercise faithfully.  Patient did follow with dermatology since our last visit, being seen about 4 weeks ago.  Notes are reviewed.  She is having a "flare up" of her sciatica/piriformis syndrome and having trouble sitting b/c of it.  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  She does love it.  Current prescribed movement disorder medications: Carbidopa/levodopa 25/100, 1 tablet 3 times per day (started last visit)   ALLERGIES:  No Known Allergies  CURRENT MEDICATIONS:  Outpatient Encounter Medications as of 07/09/2021  Medication Sig   aspirin 81 MG tablet Take 81 mg by mouth daily.   carbidopa-levodopa (SINEMET IR) 25-100 MG tablet TAKE 1 TABLET BY MOUTH THREE TIMES DAILY 7AM/11AM/4PM   magnesium 30 MG tablet Take 30 mg by mouth daily.   Omega-3 Fatty Acids (OMEGA-3 FISH OIL) 500 MG CAPS Take 50 mg by mouth.   OVER THE COUNTER MEDICATION Calcium with D3 500 mg   TART CHERRY PO Take 1 tablet by mouth daily.   [DISCONTINUED] Turmeric (QC TUMERIC COMPLEX PO) Take 1 tablet by mouth daily. (Patient not taking: Reported on 07/09/2021)   [DISCONTINUED] VITAMIN D PO Take by mouth. (Patient not taking: Reported on 07/09/2021)   No facility-administered encounter medications on file as of 07/09/2021.    Objective:   PHYSICAL  EXAMINATION:    VITALS:   Vitals:   07/09/21 1105  BP: 124/82  Pulse: 69  SpO2: 99%  Weight: 92 lb 9.6 oz (42 kg)  Height: 4\' 11"  (1.499 m)     GEN:  The patient appears stated age and is in NAD.  She is pacing in the room b/c of sciatica/piriformis syndrome. HEENT:  Normocephalic, atraumatic.  The mucous membranes are moist.   Neurological examination:  Orientation: The patient is alert and oriented x3. Cranial nerves: There is good facial symmetry with facial hypomimia. The speech is fluent and clear. Soft palate rises symmetrically and there is no tongue deviation. Hearing is intact to conversational tone. Sensation: Sensation is intact to light touch throughout Motor: Strength is at least antigravity x4.  Movement examination: Tone: There is minimal increased tone in the RUE Abnormal movements: there is intermittent, mild right upper extremity rest tremor Coordination:  There is no significant decremation today Gait and Station: The patient ambulates well in the hall   I have reviewed and interpreted the following labs independently    Chemistry      Component Value Date/Time   NA 140 04/12/2021 1020   K 4.3 04/12/2021 1020   CL 103 04/12/2021 1020   CO2 27 04/12/2021 1020   BUN 26 (H) 04/12/2021 1020   CREATININE 1.09 04/12/2021 1020      Component Value Date/Time   CALCIUM 9.6 04/12/2021 1020   ALKPHOS 60 04/12/2021 1020  AST 20 04/12/2021 1020   ALT 2 04/12/2021 1020   BILITOT 1.0 04/12/2021 1020       Lab Results  Component Value Date   WBC 4.5 10/31/2020   HGB 14.2 10/31/2020   HCT 41.9 10/31/2020   MCV 97.1 10/31/2020   PLT 228.0 10/31/2020    Lab Results  Component Value Date   TSH 2.62 10/31/2020     Cc:  Deeann Saint, MD

## 2021-07-09 ENCOUNTER — Other Ambulatory Visit: Payer: Self-pay

## 2021-07-09 ENCOUNTER — Ambulatory Visit: Payer: Medicare HMO | Admitting: Neurology

## 2021-07-09 ENCOUNTER — Encounter: Payer: Self-pay | Admitting: Neurology

## 2021-07-09 DIAGNOSIS — G20 Parkinson's disease: Secondary | ICD-10-CM

## 2021-07-09 MED ORDER — CARBIDOPA-LEVODOPA 25-100 MG PO TABS: 1.0000 | ORAL_TABLET | Freq: Three times a day (TID) | ORAL | 2 refills | Status: DC

## 2021-07-09 NOTE — Patient Instructions (Signed)
Online Resources for Power over Parkinson's Group October 2022  Local [place] Online Groups  Power over Parkinson's Group :   Power Over Parkinson's Patient Education Group will be Wednesday, October 12th-*Hybrid meting*- in person at [place] location and via WEBEX at 2:00 pm.   Upcoming Power over Parkinson's Meetings:  2nd Wednesdays of the month at 2 pm:  October 12th, November 9th Contact Amy Marriott at [email or web address] if interested in participating in this online group Parkinson's Care Partners Group:    3rd Mondays, Contact Misty Paladino Atypical Parkinsonian Patient Group:   4th Wednesdays, Contact Misty Paladino If you are interested in participating in these online groups with Misty, please contact her directly for how to join those meetings.  Her contact information is [email or web address].   Parkinson Foundation:  www.parkinson.org PD Health at Home continues:  Mindfulness Mondays, Expert Briefing Tuesdays, Wellness Wednesdays, Take Time Thursdays, Fitness Fridays -Listings for June 2022 are on the website Upcoming Webinar:  Expert Briefing:  Let's Talk about Dementia.  Wednesday, November 2nd  at 1 pm. Upcoming Webinar:  Understanding Gene and Cell-Based Therapies in Parkinson's.  Wednesday, October 5th at 1 pm Register for expert briefings (webinars) at https://www.[email or web address].org/resources-support/online-education/expert-briefings-webinars  Please check out their website to sign up for emails and see their full online offerings  [employer] Foundation:  www.michaeljfox.org  Upcoming Webinar:   Deep Brain Stimulation:  Is it Right for me or my Loved One?  Thursday, October 20th at 12 noon Check out additional information on their website to see their full online offerings  [employer] Phinney Foundation:  www.davisphinneyfoundation.org Upcoming Webinar:  Living With and Managing Parkinson's Disease Psychosis.  Tuesday, October 18th at 3 pm.   Live Q & A:  Parkinson's Disease Psychosis.  Friday, October 28th at 3 pm. Care Partner Monthly Meetup.  With Connie Carpenter Phinney.  First Tuesday of each month, 2 pm Joy Breaks:  First Wednesday of each month, 2-3 pm. There will be art, doodling, making, crafting, listening, laughing, stories, and everything in between. No art experience necessary. No supplies required. Just show up for joy!  Register on their website. Check out additional information to Live Well Today on their website  Parkinson and Movement Disorders (PMD) Alliance:  www.pmdalliance.org [employer] Online:  Online Education Events Sign up for emails, which are sent weekly to give you updates on programming and online offerings  Parkinson's Association of [place]:  www.parkinsonassociation.org Information on online support groups, education events, and online exercises including Yoga, Parkinson's exercises and more-LOTS of information on links to PD resources and online events Virtual Support Group through Parkinson's Association of [place]; next one is scheduled for Wednesday, October 5th at 2 pm.  (These are typically scheduled for the 1st Wednesday of the month at 2 pm).  Visit website for details.  Additional links for movement activities: Parkinson's DRUMMING Classes/Music Therapy with Jane Maydian:  This is a returning class and it's FREE!  2nd Mondays, continuing October 10th.  Contact *Misty Taylor-Paladino at [email or web address] or Jane Maydian at [phone] or [email or web address]  PWR! Moves Classes at [facility] Exercise Room HAVE RESUMED!  Wednesdays 10 and 11 am.  Contact Amy Marriott, PT [email or web address] if interested Here is a link to the PWR!Moves classes on Zoom from [place] Parkinson's Foundation - Daily Mon-Sat at 10:00. Via Zoom, FREE and open to all.  There is also a link below via Facebook if you use that  platform. https://www.parkinsonsmi.org/mpf-programs/exercise-and-movement-activities https://www.facebook.com/ParkinsonsMI.org/posts/pwr-moves-exercise-class-parkinson-wellness-recovery-online-with-angee-ludwa-pt-/10156827878021813/ Parkinson's Wellness Recovery (PWR! Moves)  www.pwr4life.org Info   on the PWR! Virtual Experience:  You will have access to our expertise through self-assessment, guided plans that start with the PD-specific fundamentals, educational content, tips, Q&A with an expert, and a growing library of PD-specific pre-recorded and live exercise classes of varying types and intensity - both physical and cognitive! If that is not enough, we offer 1:1 wellness consultations (in-person or virtual) to personalize your PWR! Virtual Experience.  Parkinson Foundation Fitness Fridays:  As part of the PD Health @ Home program, this free video series focuses each week on one aspect of fitness designed to support people living with Parkinson's.  These weekly videos highlight the Parkinson Foundation recent fitness guidelines for people with Parkinson's disease.  www.parkinson.org/understanding-parkinsons/coronavirus/PD-health-at-home/Fitness-Fridays Dance for PD website is offering free, live-stream classes throughout the week, as well as links to digital library of classes:  https://danceforparkinsons.org/ Dance for Parkinson's Class:  [employer] [place].  Free offering for people with Parkinson's and care partners; virtual class.  For more information, contact [phone] or email Magalli Morana at [email or web address] Virtual dance and Pilates for Parkinson's classes: Click on the Community Tab> Parkinson's Movement Initiative Tab.  To register for classes and for more information, visit www.americandancefestival.org and click the "community" tab.  [employer] Parkinson's Cycling Classes  [employer]: 1pm on Fridays-Live classes at [employer] (Contact Margaret Hazen at  [email or web address] or [phone]) [employer]: Virtual Classes Mondays and Thursdays /Live classes Tuesday, Wednesday and Thursday (contact Marlee at [email or web address]  or [phone])  [place] [employer] Varied levels of classes are offered Mondays, Tuesdays and Thursdays at [employer] [facility].  To observe a class or for more information, call [phone] or email [email or web address] [employer]: Online Caregiver Education Opportunities:  www.well-springsolutions.org/caregiver-education/caregiver-support-group.  You may also contact Jodi Kolada at [email or web address] or [phone].   Powerful Tools for Caregivers:  6-week program beginning Thursday, October 13th.  This six-week educational series designed to provide family caregivers with practical tools to care for themselves while caring for a loved one Unlocking Dementia through Hope, Humor, and Understanding:  5-week series beginning September 7th [facility] Navigator:  Just1Navigator program, a free service to help individuals and families through the journey of determining care for older adults.  The "Navigator" is a social worker, Nicole Reynolds, who will speak with a prospective client and/or loved ones to provide an assessment of the situation and a set of recommendations for a personalized care plan -- all free of charge, and whether [facility] offers the needed service or not. If the need is not a service we provide, we are well-connected with reputable programs in town that we can refer you to.  www.well-springsolutions.org or to speak with the Navigator, call [phone].  

## 2021-07-11 ENCOUNTER — Other Ambulatory Visit: Payer: Self-pay

## 2021-07-11 ENCOUNTER — Ambulatory Visit: Payer: Medicare HMO | Admitting: Sports Medicine

## 2021-07-11 ENCOUNTER — Ambulatory Visit: Payer: Medicare HMO | Admitting: Family Medicine

## 2021-07-11 VITALS — BP 112/80 | HR 70 | Ht 59.0 in | Wt 92.0 lb

## 2021-07-11 DIAGNOSIS — M5441 Lumbago with sciatica, right side: Secondary | ICD-10-CM

## 2021-07-11 DIAGNOSIS — M9903 Segmental and somatic dysfunction of lumbar region: Secondary | ICD-10-CM | POA: Diagnosis not present

## 2021-07-11 DIAGNOSIS — M9902 Segmental and somatic dysfunction of thoracic region: Secondary | ICD-10-CM

## 2021-07-11 DIAGNOSIS — M9905 Segmental and somatic dysfunction of pelvic region: Secondary | ICD-10-CM | POA: Diagnosis not present

## 2021-07-11 DIAGNOSIS — G8929 Other chronic pain: Secondary | ICD-10-CM

## 2021-07-11 MED ORDER — MELOXICAM 7.5 MG PO TABS: 7.5000 mg | ORAL_TABLET | Freq: Every day | ORAL | 0 refills | Status: DC

## 2021-07-11 NOTE — Progress Notes (Signed)
    Jennifer Lutz D.[name] [address] [place] [address] Phone: [phone]   Assessment and Plan:     1. Chronic bilateral low back pain with right-sided sciatica 2. Somatic dysfunction of pelvic region 3. Somatic dysfunction of thoracic region 4. Somatic dysfunction of lumbar region -Chronic with exacerbation, subsequent sports medicine visit - Recurrence of right low back pain with right-sided sciatica without significant MOI and no red flag symptoms, so no imaging obtained today - Patient elected for OMT.  Tolerated well per note below - Start meloxicam 7.5 mg daily for 1 to 2 weeks and then discontinue  Decision today to treat with OMT was based on Physical Exam   After verbal consent patient was treated with HVLA (high velocity low amplitude), ME (muscle energy), FPR (flex positional release), ST (soft tissue), PC/PD (Pelvic Compression/ Pelvic Decompression) techniques in  thoracic, lumbar, and pelvic areas. Patient tolerated the procedure well with improvement in symptoms.  Patient educated on potential side effects of soreness and recommended to rest, hydrate, and use Tylenol as needed for pain control.   Pertinent previous records reviewed include previous sports medicine note   Follow Up: Next month with Dr. Katrinka Blazing for repeat OMT   Subjective:   I, Jennifer Lutz, am serving as a scribe for Dr. Richardean Sale  Chief Complaint: Back pain   HPI:   07/11/21 Patient is a 70 year old female presenting with back pain. Patient was last seen by Dr. Katrinka Blazing on 05/31/21 and had OMT. Today patient states that her piriformis on the right side has been bothering her for most of the month. Patient states that sitting is the worst and heat is what helps the best. Patient states that she does not get her neck adjusted.   Relevant Historical Information: Parkinson  Additional pertinent review of systems negative.   Current Outpatient  Medications:    aspirin 81 MG tablet, Take 81 mg by mouth daily., Disp: , Rfl:    carbidopa-levodopa (SINEMET IR) 25-100 MG tablet, Take 1 tablet by mouth 3 (three) times daily. 7am/11am/4pm, Disp: 270 tablet, Rfl: 2   magnesium 30 MG tablet, Take 30 mg by mouth daily., Disp: , Rfl:    meloxicam (MOBIC) 7.5 MG tablet, Take 1 tablet (7.5 mg total) by mouth daily., Disp: 14 tablet, Rfl: 0   Omega-3 Fatty Acids (OMEGA-3 FISH OIL) 500 MG CAPS, Take 50 mg by mouth., Disp: , Rfl:    OVER THE COUNTER MEDICATION, Calcium with D3 500 mg, Disp: , Rfl:    TART CHERRY PO, Take 1 tablet by mouth daily., Disp: , Rfl:    Objective:     Vitals:   07/11/21 1019  BP: 112/80  Pulse: 70  SpO2: 99%  Weight: 92 lb (41.7 kg)  Height: 4\' 11"  (1.499 m)      Body mass index is 18.58 kg/m.    Physical Exam:    General: Well-appearing, cooperative, sitting comfortably in no acute distress.   Right hip: Positive piriformis stretch Negative Lhermitte's, Negative straight leg raise  OMT Physical Exam:  ASIS Compression Test: Positive Right Thoracic: TTP paraspinal, RRSL T4-8 Lumbar: TTP paraspinal,RRSL L1-3 Pelvis: Right anterior innominate   Electronically signed by:  D.[facility] 10:50 AM 07/11/21

## 2021-07-11 NOTE — Patient Instructions (Addendum)
Good to see you  Meloxicam 7.5mg  daily for 1-2 weeks  Keep your scheduled appointment with dr Katrinka Blazing

## 2021-07-15 ENCOUNTER — Encounter: Payer: Medicare HMO | Admitting: Gastroenterology

## 2021-08-13 ENCOUNTER — Ambulatory Visit: Payer: Medicare HMO | Admitting: Family Medicine

## 2021-08-19 ENCOUNTER — Encounter: Payer: Self-pay | Admitting: Gastroenterology

## 2021-08-22 ENCOUNTER — Ambulatory Visit: Payer: Medicare HMO | Admitting: Sports Medicine

## 2021-08-28 NOTE — Progress Notes (Signed)
[facility] [address] [place] [address] Phone: [phone]   Assessment and Plan:     1. Chronic bilateral low back pain with right-sided sciatica 2. Somatic dysfunction of thoracic region 3. Somatic dysfunction of lumbar region 4. Somatic dysfunction of pelvic region -Chronic with exacerbation, subsequent visit - Nearly resolved right-sided sciatica with chronic low back pain after OMT and course of meloxicam - Discontinue meloxicam - Continue HEP - Patient has received significant relief with OMT in the past.  Elects for repeat OMT today.  Tolerated well per note below. - Decision today to treat with OMT was based on Physical Exam   After verbal consent patient was treated with HVLA (high velocity low amplitude), ME (muscle energy), FPR (flex positional release), ST (soft tissue), PC/PD (Pelvic Compression/ Pelvic Decompression) techniques in thoracic, lumbar, and pelvic areas. Patient tolerated the procedure well with improvement in symptoms.  Patient educated on potential side effects of soreness and recommended to rest, hydrate, and use Tylenol as needed for pain control.   Pertinent previous records reviewed include none   Follow Up: As needed if recurrence of symptoms   Subjective:    I, [name], am serving as a Neurosurgeon for Doctor Richardean Sale  Chief Complaint: chronic low back pain   HPI:    07/11/21 Patient is a 70 year old female presenting with back pain. Patient was last seen by Dr. Katrinka Blazing on 05/31/21 and had OMT. Today patient states that her piriformis on the right side has been bothering her for most of the month. Patient states that sitting is the worst and heat is what helps the best. Patient states that she does not get her neck adjusted.    Relevant Historical Information: Parkinson   Additional pertinent review of systems negative.    08/29/2021 Patient states that she is doing so much better.  Patient states that she doesn't fear sitting down in a chair anymore. Does have a slight twinge still on the right piriformis.    Current Outpatient Medications  Medication Sig Dispense Refill   aspirin 81 MG tablet Take 81 mg by mouth daily.     carbidopa-levodopa (SINEMET IR) 25-100 MG tablet Take 1 tablet by mouth 3 (three) times daily. 7am/11am/4pm 270 tablet 2   magnesium 30 MG tablet Take 30 mg by mouth daily.     meloxicam (MOBIC) 7.5 MG tablet Take 1 tablet (7.5 mg total) by mouth daily. 14 tablet 0   Omega-3 Fatty Acids (OMEGA-3 FISH OIL) 500 MG CAPS Take 50 mg by mouth.     OVER THE COUNTER MEDICATION Calcium with D3 500 mg     TART CHERRY PO Take 1 tablet by mouth daily.     No current facility-administered medications for this visit.      Objective:     Vitals:   08/29/21 1002  BP: 110/80  Pulse: 87  SpO2: 99%  Weight: 93 lb (42.2 kg)  Height: 4\' 11"  (1.499 m)      Body mass index is 18.78 kg/m.    Physical Exam:     General: Well-appearing, cooperative, sitting comfortably in no acute distress.   OMT Physical Exam:  ASIS Compression Test: Positive Right Thoracic: TTP paraspinal, increased kyphosis of thoracic spine with limited rotation Lumbar: TTP paraspinal, L1-4 RRSL Pelvis: Right anterior innominate   Piriformis test bilaterally positive for tension but not pain   Electronically signed by:  D.Jennifer Lutz [facility] 10:41 AM 08/29/21

## 2021-08-29 ENCOUNTER — Ambulatory Visit (INDEPENDENT_AMBULATORY_CARE_PROVIDER_SITE_OTHER): Payer: Medicare HMO | Admitting: Sports Medicine

## 2021-08-29 ENCOUNTER — Other Ambulatory Visit: Payer: Self-pay

## 2021-08-29 VITALS — BP 110/80 | HR 87 | Ht 59.0 in | Wt 93.0 lb

## 2021-08-29 DIAGNOSIS — M9903 Segmental and somatic dysfunction of lumbar region: Secondary | ICD-10-CM | POA: Diagnosis not present

## 2021-08-29 DIAGNOSIS — M9902 Segmental and somatic dysfunction of thoracic region: Secondary | ICD-10-CM

## 2021-08-29 DIAGNOSIS — M5441 Lumbago with sciatica, right side: Secondary | ICD-10-CM

## 2021-08-29 DIAGNOSIS — G8929 Other chronic pain: Secondary | ICD-10-CM | POA: Diagnosis not present

## 2021-08-29 DIAGNOSIS — M9905 Segmental and somatic dysfunction of pelvic region: Secondary | ICD-10-CM

## 2021-08-29 NOTE — Patient Instructions (Signed)
Good to see you   Follow up in  

## 2021-09-13 ENCOUNTER — Ambulatory Visit: Payer: Medicare HMO | Admitting: Family Medicine

## 2021-10-11 ENCOUNTER — Ambulatory Visit
Admission: RE | Admit: 2021-10-11 | Discharge: 2021-10-11 | Disposition: A | Discharge status: Home / Self Care | Payer: Medicare HMO | Source: Ambulatory Visit | Attending: Family Medicine | Admitting: Family Medicine

## 2021-10-11 DIAGNOSIS — Z780 Asymptomatic menopausal state: Secondary | ICD-10-CM | POA: Diagnosis not present

## 2021-10-11 DIAGNOSIS — M85851 Other specified disorders of bone density and structure, right thigh: Secondary | ICD-10-CM | POA: Diagnosis not present

## 2021-10-11 DIAGNOSIS — M810 Age-related osteoporosis without current pathological fracture: Secondary | ICD-10-CM | POA: Diagnosis not present

## 2021-10-14 ENCOUNTER — Other Ambulatory Visit: Payer: Self-pay

## 2021-10-14 ENCOUNTER — Ambulatory Visit (AMBULATORY_SURGERY_CENTER): Payer: Medicare HMO | Admitting: *Deleted

## 2021-10-14 VITALS — Ht 59.0 in | Wt 93.0 lb

## 2021-10-14 DIAGNOSIS — Z1211 Encounter for screening for malignant neoplasm of colon: Secondary | ICD-10-CM

## 2021-10-14 MED ORDER — CLENPIQ 10-3.5-12 MG-GM -GM/160ML PO SOLN: 1.0000 | ORAL | 0 refills | Status: DC

## 2021-10-14 NOTE — Progress Notes (Signed)
No egg or soy allergy known to patient  No issues known to pt with past sedation with any surgeries or procedures- but hard to wake-  Patient denies ever being told they had issues or difficulty with intubation  No FH of Malignant Hyperthermia Pt is not on diet pills Pt is not on  home 02  Pt is not on blood thinners  Pt denies issues with constipation  No A fib or A flutter  Pt is fully vaccinated  for Covid   NO PA's for preps discussed with pt In PV today  Discussed with pt there will be an out-of-pocket cost for prep and that varies from $0 to 70 +  dollars - pt verbalized understanding   Due to the COVID-19 pandemic we are asking patients to follow certain guidelines in PV and the [facility]   Pt aware of COVID protocols and [facility] guidelines   PV completed over the phone. Pt verified name, DOB, address and insurance during PV today.  Pt mailed instruction packet with copy of consent form to read and not return, and instructions.  Pt encouraged to call with questions or issues.  If pt has My chart, procedure instructions sent via My Chart

## 2021-10-23 ENCOUNTER — Encounter: Payer: Self-pay | Admitting: Gastroenterology

## 2021-10-28 ENCOUNTER — Other Ambulatory Visit: Payer: Self-pay

## 2021-10-28 ENCOUNTER — Encounter: Payer: Self-pay | Admitting: Gastroenterology

## 2021-10-28 ENCOUNTER — Ambulatory Visit (AMBULATORY_SURGERY_CENTER): Payer: Medicare HMO | Admitting: Gastroenterology

## 2021-10-28 VITALS — BP 140/71 | HR 51 | Temp 99.8°F | Resp 14 | Ht 59.0 in | Wt 93.0 lb

## 2021-10-28 DIAGNOSIS — Z1211 Encounter for screening for malignant neoplasm of colon: Secondary | ICD-10-CM | POA: Diagnosis not present

## 2021-10-28 DIAGNOSIS — G20 Parkinson's disease: Secondary | ICD-10-CM | POA: Diagnosis not present

## 2021-10-28 MED ORDER — SODIUM CHLORIDE 0.9 % IV SOLN: 500.0000 mL | Freq: Once | INTRAVENOUS | Status: DC

## 2021-10-28 NOTE — Op Note (Signed)
[facility] Patient Name: Jennifer Lutz Procedure Date: 10/28/2021 7:55 AM MRN: [id number] Endoscopist: Milus Banister , MD Age: 71 Referring MD:  Date of Birth: 1951/08/03 Gender: Female Account #: [id number] Procedure:                Colonoscopy Indications:              Screening for colorectal malignant neoplasm Medicines:                Monitored Anesthesia Care Procedure:                Pre-Anesthesia Assessment:                           - Prior to the procedure, a History and Physical                            was performed, and patient medications and                            allergies were reviewed. The patient's tolerance of                            previous anesthesia was also reviewed. The risks                            and benefits of the procedure and the sedation                            options and risks were discussed with the patient.                            All questions were answered, and informed consent                            was obtained. Prior Anticoagulants: The patient has                            taken no previous anticoagulant or antiplatelet                            agents. ASA Grade Assessment: II - A patient with                            mild systemic disease. After reviewing the risks                            and benefits, the patient was deemed in                            satisfactory condition to undergo the procedure.                           After obtaining informed consent, the colonoscope  was passed under direct vision. Throughout the                            procedure, the patient's blood pressure, pulse, and                            oxygen saturations were monitored continuously. The                            Olympus CF-HQ190L ([id number]) Colonoscope was                            introduced through the anus and advanced to the the                            cecum,  identified by appendiceal orifice and                            ileocecal valve. The colonoscopy was performed                            without difficulty. The patient tolerated the                            procedure well. The quality of the bowel                            preparation was good. The ileocecal valve,                            appendiceal orifice, and rectum were photographed. Scope In: 7:58:16 AM Scope Out: 8:08:40 AM Scope Withdrawal Time: 0 hours 7 minutes 33 seconds  Total Procedure Duration: 0 hours 10 minutes 24 seconds  Findings:                 The entire examined colon appeared normal on direct                            and retroflexion views. Complications:            No immediate complications. Estimated blood loss:                            None. Estimated Blood Loss:     Estimated blood loss: none. Impression:               - The entire examined colon is normal on direct and                            retroflexion views.                           - No polyps or cancers. Recommendation:           - Patient has a contact number available for  emergencies. The signs and symptoms of potential                            delayed complications were discussed with the                            patient. Return to normal activities tomorrow.                            Written discharge instructions were provided to the                            patient.                           - Resume previous diet.                           - Continue present medications.                           - There is no need for further colon cancer                            screening by any method (including stool testing). Milus Banister, MD 10/28/2021 8:11:02 AM This report has been signed electronically.

## 2021-10-28 NOTE — Progress Notes (Signed)
Report to PACU, RN, vss, BBS= Clear.  

## 2021-10-28 NOTE — Progress Notes (Signed)
Pt's states no medical or surgical changes since previsit or office visit. 

## 2021-10-28 NOTE — Progress Notes (Signed)
HPI: This is a woman at routine risk for colon cancer    ROS: complete GI ROS as described in HPI, all other review negative.  Constitutional:  No unintentional weight loss   Past Medical History:  Diagnosis Date   Chronic kidney disease    reduced kidney function   Complication of anesthesia    'takes very little.' 'Hard to wake up'   Degenerative disc disease, lumbar    Hyperlipidemia    no meds- diet controlled   Medical history non-contributory    Neuromuscular disorder (HCC)    Parkinson's disease    Past Surgical History:  Procedure Laterality Date   COLONOSCOPY  2008   FASCIAL DEFECT REPAIR Left 1983   left thigh   NASAL SEPTUM SURGERY  09/23/1983   WRIST ARTHROSCOPY WITH ULNA SHORTENING Left 06/07/2015   Procedure: ARTHROSCOPY LEFT WRIST DEBRIDEMENT ULNAR SHORTENING;  Surgeon: Cindee Salt, MD;  Location: [facility];  Service: Orthopedics;  Laterality: Left;   WRIST OSTEOTOMY Left 06/07/2015   Procedure: LEFT OSTEOTOMY;  Surgeon: Cindee Salt, MD;  Location: [facility];  Service: Orthopedics;  Laterality: Left;    Current Outpatient Medications  Medication Sig Dispense Refill   aspirin 81 MG tablet Take 81 mg by mouth daily.     carbidopa-levodopa (SINEMET IR) 25-100 MG tablet Take 1 tablet by mouth 3 (three) times daily. 7am/11am/4pm 270 tablet 2   magnesium 30 MG tablet Take 30 mg by mouth daily.     Omega-3 Fatty Acids (OMEGA-3 FISH OIL) 500 MG CAPS Take 50 mg by mouth.     OVER THE COUNTER MEDICATION Calcium with D3 500 mg     TART CHERRY PO Take 1 tablet by mouth daily.     TURMERIC PO Take by mouth. (Patient not taking: Reported on 10/28/2021)     Current Facility-Administered Medications  Medication Dose Route Frequency Provider Last Rate Last Admin   0.9 %  sodium chloride infusion  500 mL Intravenous Once Rachael Fee, MD        Allergies as of 10/28/2021   (No Known Allergies)    Family History  Problem Relation Age  of Onset   Stroke Mother    Colon polyps Brother    Stroke Other    Colon cancer Neg Hx    Esophageal cancer Neg Hx    Stomach cancer Neg Hx    Rectal cancer Neg Hx     Social History   Socioeconomic History   Marital status: Married    Spouse name: Not on file   Number of children: Not on file   Years of education: Not on file   Highest education level: Not on file  Occupational History   Not on file  Tobacco Use   Smoking status: Never   Smokeless tobacco: Never  Vaping Use   Vaping Use: Never used  Substance and Sexual Activity   Alcohol use: Yes    Comment: 2 glasses qd   Drug use: No   Sexual activity: Not on file  Other Topics Concern   Not on file  Social History Narrative   Not on file   Social Determinants of Health   Financial Resource Strain: Low Risk    Difficulty of Paying Living Expenses: Not hard at all  Food Insecurity: No Food Insecurity   Worried About Programme researcher, broadcasting/film/video in the Last Year: Never true   Ran Out of Food in the Last Year: Never true  Transportation Needs: No Regulatory affairs officer (Medical): No   Lack of Transportation (Non-Medical): No  Physical Activity: Sufficiently Active   Days of Exercise per Week: 3 days   Minutes of Exercise per Session: 90 min  Stress: No Stress Concern Present   Feeling of Stress : Not at all  Social Connections: Moderately Isolated   Frequency of Communication with Friends and Family: Three times a week   Frequency of Social Gatherings with Friends and Family: Three times a week   Attends Religious Services: Never   Active Member of Clubs or Organizations: Yes   Attends Engineer, structural: More than 4 times per year   Marital Status: Divorced  Catering manager Violence: Not At Risk   Fear of Current or Ex-Partner: No   Emotionally Abused: No   Physically Abused: No   Sexually Abused: No     Physical Exam: BP (!) 166/88    Pulse 73    Temp 99.8 F (37.7 C)  (Temporal)    Ht 4\' 11"  (1.499 m)    Wt 93 lb (42.2 kg)    SpO2 100%    BMI 18.78 kg/m  Constitutional: generally well-appearing Psychiatric: alert and oriented x3 Abdomen: soft, nontender, nondistended, no obvious ascites, no peritoneal signs, normal bowel sounds No peripheral edema noted in lower extremities  Assessment and plan: 71 y.o. female with routine risk for colon cancer  Screening colonoscopy today  Please see the "Patient Instructions" section for addition details about the plan.  59, MD [facility] 10/28/2021, 7:35 AM

## 2021-10-28 NOTE — Patient Instructions (Signed)
Read all of the handouts given to you by your recovery room nurse. ? ?YOU HAD AN ENDOSCOPIC PROCEDURE TODAY AT THE [facility]:   Refer to the procedure report that was given to you for any specific questions about what was found during the examination.  If the procedure report does not answer your questions, please call your gastroenterologist to clarify.  If you requested that your care partner not be given the details of your procedure findings, then the procedure report has been included in a sealed envelope for you to review at your convenience later. ? ?YOU SHOULD EXPECT: Some feelings of bloating in the abdomen. Passage of more gas than usual.  Walking can help get rid of the air that was put into your GI tract during the procedure and reduce the bloating. If you had a lower endoscopy (such as a colonoscopy or flexible sigmoidoscopy) you may notice spotting of blood in your stool or on the toilet paper. If you underwent a bowel prep for your procedure, you may not have a normal bowel movement for a few days. ? ?Please Note:  You might notice some irritation and congestion in your nose or some drainage.  This is from the oxygen used during your procedure.  There is no need for concern and it should clear up in a day or so. ? ?SYMPTOMS TO REPORT IMMEDIATELY: ? ?Following lower endoscopy (colonoscopy or flexible sigmoidoscopy): ? Excessive amounts of blood in the stool ? Significant tenderness or worsening of abdominal pains ? Swelling of the abdomen that is new, acute ? Fever of 100?F or higher ? ?  ?For urgent or emergent issues, a gastroenterologist can be reached at any hour by calling [phone]. ?Do not use MyChart messaging for urgent concerns.  ? ? ?DIET:  We do recommend a small meal at first, but then you may proceed to your regular diet.  Drink plenty of fluids but you should avoid alcoholic beverages for 24 hours. ? ?ACTIVITY:  You should plan to take it easy for the rest of today  and you should NOT DRIVE or use heavy machinery until tomorrow (because of the sedation medicines used during the test).   ? ?FOLLOW UP: ?Our staff will call the number listed on your records 48-72 hours following your procedure to check on you and address any questions or concerns that you may have regarding the information given to you following your procedure. If we do not reach you, we will leave a message.  We will attempt to reach you two times.  During this call, we will ask if you have developed any symptoms of COVID 19. If you develop any symptoms (ie: fever, flu-like symptoms, shortness of breath, cough etc.) before then, please call [phone].  If you test positive for Covid 19 in the 2 weeks post procedure, please call and report this information to us.   ? ?If any biopsies were taken you will be contacted by phone or by letter within the next 1-3 weeks.  Please call us at [phone] if you have not heard about the biopsies in 3 weeks.  ? ? ?SIGNATURES/CONFIDENTIALITY: ?You and/or your care partner have signed paperwork which will be entered into your electronic medical record.  These signatures attest to the fact that that the information above on your After Visit Summary has been reviewed and is understood.  Full responsibility of the confidentiality of this discharge information lies with you and/or your care-partner.  ?

## 2021-10-30 ENCOUNTER — Telehealth: Payer: Self-pay

## 2021-10-30 NOTE — Telephone Encounter (Signed)
°  Follow up Call-  Call back number 10/28/2021  Post procedure Call Back phone  # [phone]  Permission to leave phone message Yes  Some recent data might be hidden     Patient questions:  Do you have a fever, pain , or abdominal swelling? No. Pain Score  0 *  Have you tolerated food without any problems? Yes.    Have you been able to return to your normal activities? Yes.    Do you have any questions about your discharge instructions: Diet   No. Medications  No. Follow up visit  No.  Do you have questions or concerns about your Care? No.  Actions: * If pain score is 4 or above: No action needed, pain <4.  Have you developed a fever since your procedure? no  2.   Have you had an respiratory symptoms (SOB or cough) since your procedure? no  3.   Have you tested positive for COVID 19 since your procedure no  4.   Have you had any family members/close contacts diagnosed with the COVID 19 since your procedure?  no   If yes to any of these questions please route to Laverna Peace, RN and Karlton Lemon, RN

## 2022-01-17 NOTE — Progress Notes (Signed)
? ? ?Assessment/Plan:  ? ?1.  Parkinsons Disease ? -Continue carbidopa/levodopa 25/100, 1 tablet 3 times per day.  She looks great today. ? -She is following regularly with dermatology.  Last saw Dr. Jorja Loa on September 13. ? ?2.  Sciatica/low back pain ? -Following with Dr. Katrinka Blazing and Dr. Jean Rosenthal.  Treatment with OMM ?Subjective:  ? ?Jennifer Lutz was seen today in follow up for Parkinsons disease.  My previous records were reviewed prior to todays visit as well as outside records available to me.  Levodopa was started last visit.  She is tolerating the medication well.  She denies side effects. Pt denies falls.  Pt has occ lightheadedness with bending over but near syncope.  No hallucinations.  Mood has been good.  She is following with Dr. Jean Rosenthal now for back pain.  This has helped and she is doing great in that regard.  She states that she is working part time at [employer] and she is getting her exercise.   ? ?Current prescribed movement disorder medications: ?Carbidopa/levodopa 25/100, 1 tablet 3 times per day (started last visit) ? ? ?ALLERGIES:  No Known Allergies ? ?CURRENT MEDICATIONS:  ?Outpatient Encounter Medications as of 01/21/2022  ?Medication Sig  ? aspirin 81 MG tablet Take 81 mg by mouth daily.  ? carbidopa-levodopa (SINEMET IR) 25-100 MG tablet Take 1 tablet by mouth 3 (three) times daily. 7am/11am/4pm  ? magnesium 30 MG tablet Take 30 mg by mouth daily.  ? Omega-3 Fatty Acids (OMEGA-3 FISH OIL) 500 MG CAPS Take 50 mg by mouth.  ? OVER THE COUNTER MEDICATION Calcium with D3 500 mg  ? TART CHERRY PO Take 1 tablet by mouth daily.  ? [DISCONTINUED] TURMERIC PO Take by mouth. (Patient not taking: Reported on 10/28/2021)  ? ?No facility-administered encounter medications on file as of 01/21/2022.  ? ? ?Objective:  ? ?PHYSICAL EXAMINATION:   ? ?VITALS:   ?Vitals:  ? 01/21/22 1031  ?BP: 120/80  ?Pulse: 62  ?SpO2: 99%  ?Weight: 91 lb 3.2 oz (41.4 kg)  ?Height: 4\' 11"  (1.499 m)   ? ? ? ? ?GEN:  The patient appears stated age and is in NAD.  She is pacing in the room b/c of sciatica/piriformis syndrome. ?HEENT:  Normocephalic, atraumatic.  The mucous membranes are moist.  ? ?Neurological examination: ? ?Orientation: The patient is alert and oriented x3. ?Cranial nerves: There is good facial symmetry with facial hypomimia. The speech is fluent and clear. Soft palate rises symmetrically and there is no tongue deviation. Hearing is intact to conversational tone. ?Sensation: Sensation is intact to light touch throughout ?Motor: Strength is at least antigravity x4. ? ?Movement examination: ?Tone: There is normal tone in the UE ?Abnormal movements: there is no tremor ?Coordination:  There is no decremation, with any form of RAMS, including alternating supination and pronation of the forearm, hand opening and closing, finger taps, heel taps and toe taps. ?Gait and Station: The patient ambulates well in the hall with good arm swing.   ? ?I have reviewed and interpreted the following labs independently ? ?  Chemistry   ?   ?Component Value Date/Time  ? NA 140 04/12/2021 1020  ? K 4.3 04/12/2021 1020  ? CL 103 04/12/2021 1020  ? CO2 27 04/12/2021 1020  ? BUN 26 (H) 04/12/2021 1020  ? CREATININE 1.09 04/12/2021 1020  ?    ?Component Value Date/Time  ? CALCIUM 9.6 04/12/2021 1020  ? ALKPHOS 60 04/12/2021 1020  ?  AST 20 04/12/2021 1020  ? ALT 2 04/12/2021 1020  ? BILITOT 1.0 04/12/2021 1020  ?  ? ? ? ?Lab Results  ?Component Value Date  ? WBC 4.5 10/31/2020  ? HGB 14.2 10/31/2020  ? HCT 41.9 10/31/2020  ? MCV 97.1 10/31/2020  ? PLT 228.0 10/31/2020  ? ? ?Lab Results  ?Component Value Date  ? TSH 2.62 10/31/2020  ? ? ? ?Cc:  Deeann Saint, MD ? ?

## 2022-01-21 ENCOUNTER — Encounter: Payer: Self-pay | Admitting: Neurology

## 2022-01-21 ENCOUNTER — Ambulatory Visit: Payer: Medicare HMO | Admitting: Neurology

## 2022-01-21 VITALS — BP 120/80 | HR 62 | Ht 59.0 in | Wt 91.2 lb

## 2022-01-21 DIAGNOSIS — G20 Parkinson's disease: Secondary | ICD-10-CM | POA: Diagnosis not present

## 2022-01-21 NOTE — Patient Instructions (Addendum)
As a reminder, carbidopa/levodopa can be taken at the same time as a carbohydrate, but we like to have you take your pill either 30 minutes before a protein source or 1 hour after as protein can interfere with carbidopa/levodopa absorption. ? ? ?Local and Online Resources for Power over Parkinson's Group ?April 2023 ? ?LOCAL [place] PARKINSON'S GROUPS  ?Power over Parkinson's Group:   ?Power Over Parkinson's Patient Education Group will be Wednesday, April 12th-*Hybrid meting*- in person at [facility] location and via [facility] at 2:00 pm.   ?Upcoming Power over Parkinson's Meetings:  2nd Wednesdays of the month at 2 pm:  April 12th, May 10th ?Contact Amy Marriott at [email or web address] if interested in participating in this group ?Parkinson's Care [facility] Group:    3rd Mondays, Contact Misty Paladino ?Atypical Parkinsonian Patient Group:   4th Wednesdays, Contact Misty Paladino ?If you are interested in participating in these groups with Misty, please contact her directly for how to join those meetings.  Her contact information is [email or web address].   ? ?LOCAL EVENTS AND NEW OFFERINGS ?Dine out at [employer].  Celebrate Parkinson's disease Awareness Month and Support the Parkinson's Movement Disorder Fund.   Wednesday, April 19th 4-6 pm at [place], [employer].  (Give receipt to cashier and 20% will be donated) ?[place]!  Play [place]!  Join [place] for home game for a fun evening to bring awareness of Parkinson's and raise funds for our Movement Disorder Funds. April 28th 6:30 pm [address] To purchase tickets:  [email or web address] ?Parkinson's T-shirts for sale!  Designed by a local group member, with funds going to Movement Disorders Fund.  $20.00  Contact Misty to purchase (see email above) ?New PWR! Moves [employer] Instructor-Led Class offering at [employer]! Starting Wednesdays 1-2 pm, starting  April 12th.   Contact Irma Newness, Visual merchandiser at [employer].  [email or web address] ? ?ONLINE EDUCATION AND SUPPORT ?Parkinson Foundation:  www.parkinson.org ?PD Health at Home continues:  Mindfulness Mondays, Expert Briefing Tuesdays, Wellness Wednesdays, Take Time Thursdays, Fitness Fridays  ?Upcoming Education:  ?Freezing and Fall Prevention in Parkinson's.  Wednesday, April 12th at 1:00 pm ?Understanding Gene and Cell-Based Therapies in Parkinson's.  Wednesday, May 10th at 1:00 pm ?Register for expert briefings (webinars) at [email or web address] ?Please check out their website to sign up for emails and see their full online offerings ? ? ?[employer] Foundation:  www.michaeljfox.org  ?Third Thursday Webinars:  On the third Thursday of every month at 12 p.m. ET, join our free live webinars to learn about various aspects of living with Parkinson's disease and our work to speed medical breakthroughs. ?Upcoming Webinar:  Brainwaves:  The Potential of Focused Ultrasound and Deep Brain Stimulation in PD.  Thursday, April 20th at  12 noon. ?Check out additional information on their website to see their full online offerings ? ?[employer] Foundation:  www.davisphinneyfoundation.org ?Upcoming Webinar:   Stay tuned ?Webinar Series:  Living with Parkinson's Meetup.   Third Thursdays each month, 3 pm ?Care Partner Monthly Meetup.  With Jillene Bucks [place].  First Tuesday of each month, 2 pm ?Check out additional information to Live Well Today on their website ? ?Parkinson and Movement Disorders (PMD) Alliance:  www.pmdalliance.org ?[employer] Online:  Online Education Events ?Sign up for emails, which are sent weekly to give you updates on programming and online offerings ? ?Parkinson's Association of [place]:  www.parkinsonassociation.org ?Information on online support groups, education events, and online exercises including Yoga,  Parkinson's exercises and more-LOTS of information on links to  PD resources and online events ?Virtual Support Group through [employer] of the [place]; next one is scheduled for Wednesday, April 5th at 2 pm. (These are typically scheduled for the 1st Wednesday of the month at 2 pm).  Visit website for details. ?MOVEMENT AND EXERCISE OPPORTUNITIES ?Parkinson's DRUMMING Classes/Music Therapy with Albertina Parr:  This is a returning class and it's FREE!  2nd Mondays, continuing April 10th  , 11:00 at the [address] of the [employer].  Contact *Misty Taylor-Paladino at [email or web address] or Albertina Parr at [phone] or [email or web address]  ?PWR! Moves Classes at [facility] Exercise Room.  Wednesdays 10 and 11 am.   Contact Amy Marriott, PT [email or web address] if interested. ?NEW PWR! Moves Class offering at [employer].  Wednesdays 1-2 pm, starting April 12th.  Contact Irma Newness, Visual merchandiser at [employer].  [email or web address] ?Here is a link to the PWR!Moves classes on Zoom from [place] - Daily Mon-Sat at 10:00. Via Zoom, FREE and open to all.  There is also a link below via Facebook if you use that platform. ? ?CopCameras.is ?[email or web address]://www.https://[email or web address]/ ? ?Parkinson's Wellness Recovery (PWR! Moves)  www.pwr4life.org ?Info on the PWR! Virtual Experience:  You will have access to our expertise through self-assessment, guided plans that start with the PD-specific fundamentals, educational content, tips, Q&A with an expert, and a growing Engineering geologist of PD-specific pre-recorded and live exercise classes of varying types and intensity - both physical and cognitive! If that is not enough, we offer 1:1 wellness consultations (in-person  or virtual) to personalize your PWR! Dance movement psychotherapist.  ?[employer]  Fridays:  ?As part of the PD Health @ Home program, this free video series focuses each week on one aspect of fitness designed to support people living with Parkinson's.  These weekly videos highlight the Parkinson Foundation recent fitness guidelines for people with Parkinson's disease. ?www.http://rojas-anderson.net/ ?Dance for PD website is offering free, live-stream classes throughout the week, as well as links to [employer] of classes:  https://danceforparkinsons.org/ ?Dance for Parkinson's in-person class.  February 1-April 26, Wednesdays 4-5 pm.  Free class for people with Parkinson's disease, at [address], [place].  Contact [phone] or Info@danceproject [email or web address] to register ?Virtual dance and Pilates for Parkinson's classes: Click on the Community Tab> Parkinson's Movement Initiative Tab.  To register for classes and for more information, visit www.NoteBack.co.za and click the ?community? tab.  ?[employer] Parkinson's Cycling Classes  ?[employer]:  Thursdays @ Noon-Live classes at [employer] ([employer] at [email or web address] or [phone]) ?[facility] [employer]: Virtual Classes Mondays and Thursdays Fawn Kirk classes Tuesday, Wednesday and Thursday (contact [place] at [email or web address]  or [phone]) ?[employer] ?Varied levels of classes are offered Mondays, Tuesdays and Thursdays at [employer].  ?Stretching with Byrd Hesselbach weekly class is also offered for people with Parkinson's ?To observe a class or for more information, call [phone] or email Patricia Nettle at info@[email or web address] [email or web address] ?ADDITIONAL SUPPORT AND RESOURCES ?Well[employer]:Online Caregiver Education Opportunities:  www.well-springsolutions.org/caregiver-education/caregiver-support-group.  You may also contact Loleta Chance at [email or web address] or ([phone].    ?Well-Spring Navigator:  Just1Navigator program, a free service to help individuals and families through the journey of determining care for older adults.  The

## 2022-02-03 ENCOUNTER — Other Ambulatory Visit: Payer: Self-pay | Admitting: Family Medicine

## 2022-02-03 DIAGNOSIS — Z1231 Encounter for screening mammogram for malignant neoplasm of breast: Secondary | ICD-10-CM

## 2022-02-13 ENCOUNTER — Ambulatory Visit
Admission: RE | Admit: 2022-02-13 | Discharge: 2022-02-13 | Disposition: A | Discharge status: Home / Self Care | Payer: Medicare HMO | Source: Ambulatory Visit | Attending: Family Medicine | Admitting: Family Medicine

## 2022-02-13 DIAGNOSIS — Z1231 Encounter for screening mammogram for malignant neoplasm of breast: Secondary | ICD-10-CM | POA: Diagnosis not present

## 2022-02-14 ENCOUNTER — Other Ambulatory Visit: Payer: Self-pay | Admitting: Family Medicine

## 2022-02-14 DIAGNOSIS — R928 Other abnormal and inconclusive findings on diagnostic imaging of breast: Secondary | ICD-10-CM

## 2022-02-20 ENCOUNTER — Ambulatory Visit (INDEPENDENT_AMBULATORY_CARE_PROVIDER_SITE_OTHER): Payer: Medicare HMO | Admitting: Family Medicine

## 2022-02-20 ENCOUNTER — Encounter: Payer: Self-pay | Admitting: Family Medicine

## 2022-02-20 VITALS — BP 114/82 | HR 56 | Temp 97.9°F | Ht 59.5 in | Wt 90.8 lb

## 2022-02-20 DIAGNOSIS — M21611 Bunion of right foot: Secondary | ICD-10-CM

## 2022-02-20 DIAGNOSIS — R634 Abnormal weight loss: Secondary | ICD-10-CM

## 2022-02-20 DIAGNOSIS — Z0000 Encounter for general adult medical examination without abnormal findings: Secondary | ICD-10-CM

## 2022-02-20 DIAGNOSIS — G20 Parkinson's disease: Secondary | ICD-10-CM

## 2022-02-20 DIAGNOSIS — L84 Corns and callosities: Secondary | ICD-10-CM

## 2022-02-20 DIAGNOSIS — M21612 Bunion of left foot: Secondary | ICD-10-CM | POA: Diagnosis not present

## 2022-02-20 DIAGNOSIS — E782 Mixed hyperlipidemia: Secondary | ICD-10-CM

## 2022-02-20 LAB — CBC WITH DIFFERENTIAL/PLATELET
Basophils Absolute: 0 10*3/uL (ref 0.0–0.1)
Basophils Relative: 0.6 % (ref 0.0–3.0)
Eosinophils Absolute: 0.1 10*3/uL (ref 0.0–0.7)
Eosinophils Relative: 1.7 % (ref 0.0–5.0)
HCT: 38.6 % (ref 36.0–46.0)
Hemoglobin: 12.8 g/dL (ref 12.0–15.0)
Lymphocytes Relative: 24.6 % (ref 12.0–46.0)
Lymphs Abs: 1 10*3/uL (ref 0.7–4.0)
MCHC: 33.2 g/dL (ref 30.0–36.0)
MCV: 97.9 fl (ref 78.0–100.0)
Monocytes Absolute: 0.3 10*3/uL (ref 0.1–1.0)
Monocytes Relative: 7.1 % (ref 3.0–12.0)
Neutro Abs: 2.8 10*3/uL (ref 1.4–7.7)
Neutrophils Relative %: 66 % (ref 43.0–77.0)
Platelets: 209 10*3/uL (ref 150.0–400.0)
RBC: 3.94 Mil/uL (ref 3.87–5.11)
RDW: 13.5 % (ref 11.5–15.5)
WBC: 4.2 10*3/uL (ref 4.0–10.5)

## 2022-02-20 LAB — COMPREHENSIVE METABOLIC PANEL
ALT: 3 U/L (ref 0–35)
AST: 22 U/L (ref 0–37)
Albumin: 4.2 g/dL (ref 3.5–5.2)
Alkaline Phosphatase: 47 U/L (ref 39–117)
BUN: 21 mg/dL (ref 6–23)
CO2: 29 mEq/L (ref 19–32)
Calcium: 9.2 mg/dL (ref 8.4–10.5)
Chloride: 104 mEq/L (ref 96–112)
Creatinine, Ser: 1.02 mg/dL (ref 0.40–1.20)
GFR: 54.66 mL/min — ABNORMAL LOW (ref >60.00)
Glucose, Bld: 97 mg/dL (ref 70–99)
Potassium: 4.4 mEq/L (ref 3.5–5.1)
Sodium: 141 mEq/L (ref 135–145)
Total Bilirubin: 0.8 mg/dL (ref 0.2–1.2)
Total Protein: 6.4 g/dL (ref 6.0–8.3)

## 2022-02-20 LAB — LIPID PANEL
Cholesterol: 242 mg/dL — ABNORMAL HIGH (ref 0–200)
HDL: 124 mg/dL (ref >39.00)
LDL Cholesterol: 109 mg/dL — ABNORMAL HIGH (ref 0–99)
NonHDL: 117.85
Total CHOL/HDL Ratio: 2
Triglycerides: 44 mg/dL (ref 0.0–149.0)
VLDL: 8.8 mg/dL (ref 0.0–40.0)

## 2022-02-20 LAB — VITAMIN D 25 HYDROXY (VIT D DEFICIENCY, FRACTURES): VITD: 62.46 ng/mL (ref 30.00–100.00)

## 2022-02-20 LAB — T4, FREE: Free T4: 0.89 ng/dL (ref 0.60–1.60)

## 2022-02-20 LAB — TSH: TSH: 1.1 u[IU]/mL (ref 0.35–5.50)

## 2022-02-20 LAB — HEMOGLOBIN A1C: Hgb A1c MFr Bld: 5.5 % (ref 4.6–6.5)

## 2022-02-20 NOTE — Patient Instructions (Signed)
Consider getting your second dose of Shingrix vaccine for shingles if you have not already done so.

## 2022-02-20 NOTE — Progress Notes (Signed)
Subjective:     Jennifer Lutz is a 71 y.o. female and is here for a comprehensive physical exam. The patient reports doing well.  She is working a part-time job a a Systems analyst course 3 x/wk.  Pt states she is very active at work, walking ~4.5 miles per day.  Pt notes her balance and muscle tone have improved since working.  Pt has a callous on R 2nd toe that she is concerned about.  Pt put a piece of cotton between toes to prevent irritation of the area.  Wearing work boots that are not tight and do not rub.  Pt endorses wt loss, but starting to regain a few lbs.  Pt denies tremor or falls since taking Sinemet IR for Parkinson's dz.  Followed by Neurology.   Pt had a suspicious area on mammogram L breast 02/13/22.  Has diagnostic mammogram and possible u/s scheduled for next wk.    Social History   Socioeconomic History   Marital status: Married    Spouse name: Not on file   Number of children: Not on file   Years of education: Not on file   Highest education level: Not on file  Occupational History   Not on file  Tobacco Use   Smoking status: Never   Smokeless tobacco: Never  Vaping Use   Vaping Use: Never used  Substance and Sexual Activity   Alcohol use: Yes    Comment: 2 glasses qd   Drug use: No   Sexual activity: Not on file  Other Topics Concern   Not on file  Social History Narrative   Not on file   Social Determinants of Health   Financial Resource Strain: Low Risk    Difficulty of Paying Living Expenses: Not hard at all  Food Insecurity: No Food Insecurity   Worried About Programme researcher, broadcasting/film/video in the Last Year: Never true   Ran Out of Food in the Last Year: Never true  Transportation Needs: No Transportation Needs   Lack of Transportation (Medical): No   Lack of Transportation (Non-Medical): No  Physical Activity: Sufficiently Active   Days of Exercise per Week: 3 days   Minutes of Exercise per Session: 90 min  Stress: No Stress Concern Present   Feeling of Stress :  Not at all  Social Connections: Moderately Isolated   Frequency of Communication with Friends and Family: Three times a week   Frequency of Social Gatherings with Friends and Family: Three times a week   Attends Religious Services: Never   Active Member of Clubs or Organizations: Yes   Attends Engineer, structural: More than 4 times per year   Marital Status: Divorced  Catering manager Violence: Not At Risk   Fear of Current or Ex-Partner: No   Emotionally Abused: No   Physically Abused: No   Sexually Abused: No   Health Maintenance  Topic Date Due   Zoster Vaccines- Shingrix (2 of 2) 12/10/2019   COVID-19 Vaccine (4 - Booster for Moderna series) 11/14/2020   INFLUENZA VACCINE  04/22/2022   MAMMOGRAM  02/14/2023   TETANUS/TDAP  12/27/2023   COLONOSCOPY (Pts 45-74yrs Insurance coverage will need to be confirmed)  10/29/2031   Pneumonia Vaccine 2+ Years old  Completed   DEXA SCAN  Completed   Hepatitis C Screening  Completed   HPV VACCINES  Aged Out    The following portions of the patient's history were reviewed and updated as appropriate: allergies, current medications, past family  history, past medical history, past social history, past surgical history, and problem list.  Review of Systems Pertinent items noted in HPI and remainder of comprehensive ROS otherwise negative.   Objective:    BP 114/82 (BP Location: Left Arm, Patient Position: Sitting, Cuff Size: Normal)   Pulse (!) 56   Temp 97.9 F (36.6 C) (Oral)   Ht 4' 11.5" (1.511 m)   Wt 90 lb 12.8 oz (41.2 kg)   SpO2 97%   BMI 18.03 kg/m  General appearance: alert, cooperative, and no distress Head: Normocephalic, without obvious abnormality, atraumatic Eyes: conjunctivae/corneas clear. PERRL, EOM's intact. Fundi benign. Ears: normal TM's and external ear canals both ears Nose: Nares normal. Septum midline. Mucosa normal. No drainage or sinus tenderness. Throat: lips, mucosa, and tongue normal; teeth  and gums normal Neck: no adenopathy, no carotid bruit, no JVD, supple, symmetrical, trachea midline, and thyroid not enlarged, symmetric, no tenderness/mass/nodules Lungs: clear to auscultation bilaterally Heart: regular rate and rhythm, S1, S2 normal, no murmur, click, rub or gallop Abdomen: soft, non-tender; bowel sounds normal; no masses,  no organomegaly Extremities: extremities normal, atraumatic, no cyanosis or edema Pulses: 2+ and symmetric Skin: Skin color, texture, turgor normal. No rashes or lesions b/l bunions.  Corn on lateral surface of second right toe Lymph nodes: Cervical, supraclavicular, and axillary nodes normal. Neurologic: Alert and oriented X 3, normal strength and tone. Normal symmetric reflexes. Normal coordination and gait    Assessment:    Healthy female exam with corn on medial surface of 2nd R toe.      Plan:    Anticipatory guidance given including wearing seatbelts, smoke detectors in the home, increasing physical activity, increasing p.o. intake of water and vegetables. -labs -mammogram done 02/13/22 with diagnostic mammogram scheduled next wk. -colonoscopy up to date, done 10/28/21 -bone density done 10/11/21 -Immunizations reviewed -Handout given -Next CPE in 1 year See After Visit Summary for Counseling Recommendations   Parkinson's disease (HCC) -Stable -Continue Sinemet IR 25-100 mg 3 times daily -Continue follow-up with neurology  - Plan: CMP  Bilateral bunions -Avoid wearing shoes that are too tight in the toebox or pointy -Given handout -Offered referral to podiatry.  Patient declines at this time.  Corn of toe -Bunion causing right great toe to rub against R second toe -Discussed using a toe spacer to prevent rubbing. -Patient advised to wear supportive shoes with plenty of room in toebox -Epsom salt soaks and paring as needed -Offered referral to podiatry.  Patient declines at this time.  Mixed hyperlipidemia  -Lifestyle  modifications -Continue OTC fish oil tablets - Plan: CMP, Lipid panel  Weight loss  -Intentional/2/2 increased activity at new part-time job.   -Patient regaining weight. -Continue to monitor - Plan: CMP, Vitamin D, 25-hydroxy, CBC with Differential/Platelet, TSH, T4, Free, Hemoglobin A1c  F/u prn  Abbe Amsterdam, MD

## 2022-02-25 ENCOUNTER — Ambulatory Visit
Admission: RE | Admit: 2022-02-25 | Discharge: 2022-02-25 | Disposition: A | Discharge status: Home / Self Care | Payer: Medicare HMO | Source: Ambulatory Visit | Attending: Family Medicine | Admitting: Family Medicine

## 2022-02-25 DIAGNOSIS — N6489 Other specified disorders of breast: Secondary | ICD-10-CM | POA: Diagnosis not present

## 2022-02-25 DIAGNOSIS — R928 Other abnormal and inconclusive findings on diagnostic imaging of breast: Secondary | ICD-10-CM

## 2022-02-25 DIAGNOSIS — R922 Inconclusive mammogram: Secondary | ICD-10-CM | POA: Diagnosis not present

## 2022-04-01 ENCOUNTER — Ambulatory Visit (INDEPENDENT_AMBULATORY_CARE_PROVIDER_SITE_OTHER): Payer: Medicare HMO

## 2022-04-01 VITALS — Ht 59.0 in | Wt 90.0 lb

## 2022-04-01 DIAGNOSIS — Z0000 Encounter for general adult medical examination without abnormal findings: Secondary | ICD-10-CM

## 2022-04-01 NOTE — Progress Notes (Signed)
Subjective:   Jennifer Lutz is a 71 y.o. female who presents for Medicare Annual (Subsequent) preventive examination.  Review of Systems    Virtual Visit via Telephone Note  I connected with  [name] on 04/01/22 at  8:45 AM EDT by telephone and verified that I am speaking with the correct person using two identifiers.  Location: Patient: Home Provider: Office Persons participating in the virtual visit: patient/Nurse Health Advisor   I discussed the limitations, risks, security and privacy concerns of performing an evaluation and management service by telephone and the availability of in person appointments. The patient expressed understanding and agreed to proceed.  Interactive audio and video telecommunications were attempted between this nurse and patient, however failed, due to patient having technical difficulties OR patient did not have access to video capability.  We continued and completed visit with audio only.  Some vital signs may be absent or patient reported.   [name], LPN  Cardiac Risk Factors include: advanced age (>84men, >89 women)     Objective:    Today's Vitals   04/01/22 0849  Weight: 90 lb (40.8 kg)  Height: 4\' 11"  (1.499 m)   Body mass index is 18.18 kg/m.     04/01/2022    8:58 AM 01/21/2022   10:31 AM 07/09/2021   11:05 AM 03/29/2021   10:34 AM 01/02/2021   11:07 AM 07/03/2020    8:12 AM 04/20/2018    1:41 PM  Advanced Directives  Does Patient Have a Medical Advance Directive? Yes Yes Yes Yes Yes Yes Yes  Type of 04/22/2018 of [place];Living will  Living will Healthcare Power of [place];Living will Healthcare Power of [place];Living will Healthcare Power of [place];Living will   Does patient want to make changes to medical advance directive? No - Patient declined        Copy of Healthcare Power of Attorney in Chart? Yes - validated most recent copy scanned in chart (See row information)   No -  copy requested       Current Medications (verified) Outpatient Encounter Medications as of 04/01/2022  Medication Sig   aspirin 81 MG tablet Take 81 mg by mouth daily.   carbidopa-levodopa (SINEMET IR) 25-100 MG tablet Take 1 tablet by mouth 3 (three) times daily. 7am/11am/4pm   magnesium 30 MG tablet Take 30 mg by mouth daily.   Omega-3 Fatty Acids (OMEGA-3 FISH OIL) 500 MG CAPS Take 50 mg by mouth.   OVER THE COUNTER MEDICATION Calcium with D3 500 mg   TART CHERRY PO Take 1 tablet by mouth daily.   No facility-administered encounter medications on file as of 04/01/2022.    Allergies (verified) Patient has no known allergies.   History: Past Medical History:  Diagnosis Date   Chronic kidney disease    reduced kidney function   Complication of anesthesia    'takes very little.' 'Hard to wake up'   Degenerative disc disease, lumbar    Hyperlipidemia    no meds- diet controlled   Medical history non-contributory    Neuromuscular disorder (HCC)    Parkinson's disease   Past Surgical History:  Procedure Laterality Date   COLONOSCOPY  2008   FASCIAL DEFECT REPAIR Left 1983   left thigh   NASAL SEPTUM SURGERY  09/23/1983   WRIST ARTHROSCOPY WITH ULNA SHORTENING Left 06/07/2015   Procedure: ARTHROSCOPY LEFT WRIST DEBRIDEMENT ULNAR SHORTENING;  Surgeon: 06/09/2015, MD;  Location: [facility];  Service: Orthopedics;  Laterality: Left;   WRIST OSTEOTOMY Left 06/07/2015   Procedure: LEFT OSTEOTOMY;  Surgeon: Cindee Salt, MD;  Location: [facility];  Service: Orthopedics;  Laterality: Left;   Family History  Problem Relation Age of Onset   Stroke Mother    Colon polyps Brother    Stroke Other    Colon cancer Neg Hx    Esophageal cancer Neg Hx    Stomach cancer Neg Hx    Rectal cancer Neg Hx    Social History   Socioeconomic History   Marital status: Married    Spouse name: Not on file   Number of children: Not on file   Years of education: Not  on file   Highest education level: Not on file  Occupational History   Not on file  Tobacco Use   Smoking status: Never   Smokeless tobacco: Never  Vaping Use   Vaping Use: Never used  Substance and Sexual Activity   Alcohol use: Yes    Comment: 2 glasses qd   Drug use: No   Sexual activity: Not on file  Other Topics Concern   Not on file  Social History Narrative   Not on file   Social Determinants of Health   Financial Resource Strain: Low Risk  (04/01/2022)   Overall Financial Resource Strain (CARDIA)    Difficulty of Paying Living Expenses: Not hard at all  Food Insecurity: No Food Insecurity (04/01/2022)   Hunger Vital Sign    Worried About Running Out of Food in the Last Year: Never true    Ran Out of Food in the Last Year: Never true  Transportation Needs: No Transportation Needs (04/01/2022)   PRAPARE - Administrator, Civil Service (Medical): No    Lack of Transportation (Non-Medical): No  Physical Activity: Sufficiently Active (04/01/2022)   Exercise Vital Sign    Days of Exercise per Week: 3 days    Minutes of Exercise per Session: 150+ min  Stress: No Stress Concern Present (04/01/2022)   [employer] of Occupational Health - Occupational Stress Questionnaire    Feeling of Stress : Not at all  Social Connections: Moderately Integrated (04/01/2022)   Social Connection and Isolation Panel [NHANES]    Frequency of Communication with Friends and Family: More than three times a week    Frequency of Social Gatherings with Friends and Family: More than three times a week    Attends Religious Services: More than 4 times per year    Active Member of [employer] or Organizations: Yes    Attends Engineer, structural: More than 4 times per year    Marital Status: Divorced     Clinical Intake:   Diabetic? No  Interpreter Needed?: NoActivities of Daily Living    04/01/2022    8:57 AM  In your present state of health, do you have any difficulty  performing the following activities:  Hearing? 0  Vision? 0  Difficulty concentrating or making decisions? 0  Walking or climbing stairs? 0  Dressing or bathing? 0  Doing errands, shopping? 0  Preparing Food and eating ? N  Using the Toilet? N  In the past six months, have you accidently leaked urine? N  Do you have problems with loss of bowel control? N  Managing your Medications? N  Managing your Finances? N  Housekeeping or managing your Housekeeping? N    Patient Care Team: Deeann Saint, MD as PCP - General (Family Medicine) Tat, Octaviano Batty,  DO as Consulting Physician (Neurology) Janalyn Harder, MD as Consulting Physician (Dermatology)  Indicate any recent Medical Services you may have received from other than [facility] providers in the past year (date may be approximate).     Assessment:   This is a routine wellness examination for Chrisette.  Hearing/Vision screen Hearing Screening - Comments:: No difficulty hearing Vision Screening - Comments:: Wears contact and glasses. Followed by Dr Ronney Asters  Dietary issues and exercise activities discussed: Exercise limited by: None identified   Goals Addressed               This Visit's Progress     Stay healthy and active (pt-stated)         Depression Screen    04/01/2022    8:54 AM 02/20/2022    8:12 AM 03/29/2021   10:35 AM 10/31/2020    9:03 AM 09/01/2019    8:01 AM 04/20/2018    1:42 PM 10/03/2016   11:46 AM  PHQ 2/9 Scores  PHQ - 2 Score 0 0 0 0 0 0 0  PHQ- 9 Score 0 0         Fall Risk    04/01/2022    8:57 AM 02/20/2022    8:12 AM 01/21/2022   10:31 AM 07/09/2021   11:06 AM 03/29/2021   10:35 AM  Fall Risk   Falls in the past year? 0 0  0 0  Number falls in past yr: 0 0 0 0 0  Injury with Fall? 0 0  0 0  Risk for fall due to : No Fall Risks No Fall Risks     Follow up  Falls evaluation completed   Falls evaluation completed    FALL RISK PREVENTION PERTAINING TO THE HOME:  Any stairs in or around the  home? No  If so, are there any without handrails? No  Home free of loose throw rugs in walkways, pet beds, electrical cords, etc? Yes  Adequate lighting in your home to reduce risk of falls? Yes   ASSISTIVE DEVICES UTILIZED TO PREVENT FALLS:  Life alert? No  Use of a cane, walker or w/c? No  Grab bars in the bathroom? Yes  Shower chair or bench in shower? No  Elevated toilet seat or a handicapped toilet? Yes   TIMED UP AND GO:  Was the test performed? No . Audio Visit  Cognitive Function:      04/01/2022    8:59 AM  6CIT Screen  What Year? 0 points  What month? 0 points  What time? 0 points  Count back from 20 0 points  Months in reverse 0 points  Repeat phrase 0 points  Total Score 0 points    Immunizations Immunization History  Administered Date(s) Administered   Fluad Quad(high Dose 65+) 09/01/2019, 10/31/2020   Influenza Whole 07/08/2007, 10/16/2009   Influenza, High Dose Seasonal PF 07/29/2016, 09/07/2017   Moderna Sars-Covid-2 Vaccination 02/02/2020, 03/08/2020, 09/19/2020   Pneumococcal Conjugate-13 01/30/2015   Pneumococcal Polysaccharide-23 12/26/2013   Td 09/22/1997, 09/29/2008   Tetanus 12/26/2013   Zoster Recombinat (Shingrix) 10/15/2019   Zoster, Live 03/16/2015    TDAP status: Up to date  Flu Vaccine status: Up to date  Pneumococcal vaccine status: Up to date  Covid-19 vaccine status: Completed vaccines  Qualifies for Shingles Vaccine? Yes   Zostavax completed Yes   Shingrix Completed?: Yes  Screening Tests Health Maintenance  Topic Date Due   COVID-19 Vaccine (4 - Booster for Moderna series) 04/17/2022 (Originally  11/14/2020)   Zoster Vaccines- Shingrix (2 of 2) 07/02/2022 (Originally 12/10/2019)   INFLUENZA VACCINE  04/22/2022   MAMMOGRAM  02/14/2023   TETANUS/TDAP  12/27/2023   COLONOSCOPY (Pts 45-42yrs Insurance coverage will need to be confirmed)  10/29/2031   Pneumonia Vaccine 81+ Years old  Completed   DEXA SCAN  Completed    Hepatitis C Screening  Completed   HPV VACCINES  Aged Out    Health Maintenance  There are no preventive care reminders to display for this patient.   Colorectal cancer screening: Type of screening: Colonoscopy. Completed 10/28/21. Repeat every 10 years  Mammogram status: Completed 02/13/22. Repeat every year  Bone Density status: Completed 10/11/21. Results reflect: Bone density results: OSTEOPOROSIS. Repeat every   years.  Lung Cancer Screening: (Low Dose CT Chest recommended if Age 18-80 years, 30 pack-year currently smoking OR have quit w/in 15years.) does not qualify.     Additional Screening:  Hepatitis C Screening: does qualify; Completed 04/12/21  Vision Screening: Recommended annual ophthalmology exams for early detection of glaucoma and other disorders of the eye. Is the patient up to date with their annual eye exam?  Yes  Who is the provider or what is the name of the office in which the patient attends annual eye exams? Dr Ronney Asters If pt is not established with a provider, would they like to be referred to a provider to establish care? No .   Dental Screening: Recommended annual dental exams for proper oral hygiene  Community Resource Referral / Chronic Care Management:   CRR required this visit?  No   CCM required this visit?  No      Plan:     I have personally reviewed and noted the following in the patient's chart:   Medical and social history Use of alcohol, tobacco or illicit drugs  Current medications and supplements including opioid prescriptions.  Functional ability and status Nutritional status Physical activity Advanced directives List of other physicians Hospitalizations, surgeries, and ER visits in previous 12 months Vitals Screenings to include cognitive, depression, and falls Referrals and appointments  In addition, I have reviewed and discussed with patient certain preventive protocols, quality metrics, and best practice  recommendations. A written personalized care plan for preventive services as well as general preventive health recommendations were provided to patient.     [name], LPN   1/61/0960   Nurse Notes: None

## 2022-04-01 NOTE — Patient Instructions (Addendum)
[name] , Thank you for taking time to come for your Medicare Wellness Visit. I appreciate your ongoing commitment to your health goals. Please review the following plan we discussed and let me know if I can assist you in the future.   These are the goals we discussed:  Goals       Exercise 150 min/wk Moderate Activity      Play more golf!      Stay healthy and active (pt-stated)      Weight (lb) < 102 lb (46.3 kg)      Will try to eat more vegetables Can add frozen smoothies to meats etc         This is a list of the screening recommended for you and due dates:  Health Maintenance  Topic Date Due   COVID-19 Vaccine (4 - Booster for Moderna series) 04/17/2022*   Zoster (Shingles) Vaccine (2 of 2) 07/02/2022*   Flu Shot  04/22/2022   Mammogram  02/14/2023   Tetanus Vaccine  12/27/2023   Colon Cancer Screening  10/29/2031   Pneumonia Vaccine  Completed   DEXA scan (bone density measurement)  Completed   Hepatitis C Screening: USPSTF Recommendation to screen - Ages 60-79 yo.  Completed   HPV Vaccine  Aged Out  *Topic was postponed. The date shown is not the original due date.   Advanced directives: Yes  Conditions/risks identified: None  Next appointment: Follow up in one year for your annual wellness visit     Preventive Care 65 Years and Older, Female Preventive care refers to lifestyle choices and visits with your health care provider that can promote health and wellness. What does preventive care include? A yearly physical exam. This is also called an annual well check. Dental exams once or twice a year. Routine eye exams. Ask your health care provider how often you should have your eyes checked. Personal lifestyle choices, including: Daily care of your teeth and gums. Regular physical activity. Eating a healthy diet. Avoiding tobacco and drug use. Limiting alcohol use. Practicing safe sex. Taking low-dose aspirin every day. Taking vitamin and mineral  supplements as recommended by your health care provider. What happens during an annual well check? The services and screenings done by your health care provider during your annual well check will depend on your age, overall health, lifestyle risk factors, and family history of disease. Counseling  Your health care provider may ask you questions about your: Alcohol use. Tobacco use. Drug use. Emotional well-being. Home and relationship well-being. Sexual activity. Eating habits. History of falls. Memory and ability to understand (cognition). Work and work Astronomer. Reproductive health. Screening  You may have the following tests or measurements: Height, weight, and BMI. Blood pressure. Lipid and cholesterol levels. These may be checked every 5 years, or more frequently if you are over 68 years old. Skin check. Lung cancer screening. You may have this screening every year starting at age 47 if you have a 30-pack-year history of smoking and currently smoke or have quit within the past 15 years. Fecal occult blood test (FOBT) of the stool. You may have this test every year starting at age 46. Flexible sigmoidoscopy or colonoscopy. You may have a sigmoidoscopy every 5 years or a colonoscopy every 10 years starting at age 24. Hepatitis C blood test. Hepatitis B blood test. Sexually transmitted disease (STD) testing. Diabetes screening. This is done by checking your blood sugar (glucose) after you have not eaten for a while (fasting). You  may have this done every 1-3 years. Bone density scan. This is done to screen for osteoporosis. You may have this done starting at age 41. Mammogram. This may be done every 1-2 years. Talk to your health care provider about how often you should have regular mammograms. Talk with your health care provider about your test results, treatment options, and if necessary, the need for more tests. Vaccines  Your health care provider may recommend certain  vaccines, such as: Influenza vaccine. This is recommended every year. Tetanus, diphtheria, and acellular pertussis (Tdap, Td) vaccine. You may need a Td booster every 10 years. Zoster vaccine. You may need this after age 71. Pneumococcal 13-valent conjugate (PCV13) vaccine. One dose is recommended after age 64. Pneumococcal polysaccharide (PPSV23) vaccine. One dose is recommended after age 1. Talk to your health care provider about which screenings and vaccines you need and how often you need them. This information is not intended to replace advice given to you by your health care provider. Make sure you discuss any questions you have with your health care provider. Document Released: 10/05/2015 Document Revised: 05/28/2016 Document Reviewed: 07/10/2015 Elsevier Interactive Patient Education  2017 [place] Prevention in the Home Falls can cause injuries. They can happen to people of all ages. There are many things you can do to make your home safe and to help prevent falls. What can I do on the outside of my home? Regularly fix the edges of walkways and driveways and fix any cracks. Remove anything that might make you trip as you walk through a door, such as a raised step or threshold. Trim any bushes or trees on the path to your home. Use bright outdoor lighting. Clear any walking paths of anything that might make someone trip, such as rocks or tools. Regularly check to see if handrails are loose or broken. Make sure that both sides of any steps have handrails. Any raised decks and porches should have guardrails on the edges. Have any leaves, snow, or ice cleared regularly. Use sand or salt on walking paths during winter. Clean up any spills in your garage right away. This includes oil or grease spills. What can I do in the bathroom? Use night lights. Install grab bars by the toilet and in the tub and shower. Do not use towel bars as grab bars. Use non-skid mats or decals in  the tub or shower. If you need to sit down in the shower, use a plastic, non-slip stool. Keep the floor dry. Clean up any water that spills on the floor as soon as it happens. Remove soap buildup in the tub or shower regularly. Attach bath mats securely with double-sided non-slip rug tape. Do not have throw rugs and other things on the floor that can make you trip. What can I do in the bedroom? Use night lights. Make sure that you have a light by your bed that is easy to reach. Do not use any sheets or blankets that are too big for your bed. They should not hang down onto the floor. Have a firm chair that has side arms. You can use this for support while you get dressed. Do not have throw rugs and other things on the floor that can make you trip. What can I do in the kitchen? Clean up any spills right away. Avoid walking on wet floors. Keep items that you use a lot in easy-to-reach places. If you need to reach something above you, use a strong  step stool that has a grab bar. Keep electrical cords out of the way. Do not use floor polish or wax that makes floors slippery. If you must use wax, use non-skid floor wax. Do not have throw rugs and other things on the floor that can make you trip. What can I do with my stairs? Do not leave any items on the stairs. Make sure that there are handrails on both sides of the stairs and use them. Fix handrails that are broken or loose. Make sure that handrails are as long as the stairways. Check any carpeting to make sure that it is firmly attached to the stairs. Fix any carpet that is loose or worn. Avoid having throw rugs at the top or bottom of the stairs. If you do have throw rugs, attach them to the floor with carpet tape. Make sure that you have a light switch at the top of the stairs and the bottom of the stairs. If you do not have them, ask someone to add them for you. What else can I do to help prevent falls? Wear shoes that: Do not have high  heels. Have rubber bottoms. Are comfortable and fit you well. Are closed at the toe. Do not wear sandals. If you use a stepladder: Make sure that it is fully opened. Do not climb a closed stepladder. Make sure that both sides of the stepladder are locked into place. Ask someone to hold it for you, if possible. Clearly mark and make sure that you can see: Any grab bars or handrails. First and last steps. Where the edge of each step is. Use tools that help you move around (mobility aids) if they are needed. These include: Canes. Walkers. Scooters. Crutches. Turn on the lights when you go into a dark area. Replace any light bulbs as soon as they burn out. Set up your furniture so you have a clear path. Avoid moving your furniture around. If any of your floors are uneven, fix them. If there are any pets around you, be aware of where they are. Review your medicines with your doctor. Some medicines can make you feel dizzy. This can increase your chance of falling. Ask your doctor what other things that you can do to help prevent falls. This information is not intended to replace advice given to you by your health care provider. Make sure you discuss any questions you have with your health care provider. Document Released: 07/05/2009 Document Revised: 02/14/2016 Document Reviewed: 10/13/2014 Elsevier Interactive Patient Education  2017 [employer].

## 2022-04-22 ENCOUNTER — Other Ambulatory Visit: Payer: Self-pay | Admitting: Neurology

## 2022-04-22 DIAGNOSIS — G20 Parkinson's disease: Secondary | ICD-10-CM

## 2022-05-17 DIAGNOSIS — Z7982 Long term (current) use of aspirin: Secondary | ICD-10-CM | POA: Diagnosis not present

## 2022-05-17 DIAGNOSIS — G8929 Other chronic pain: Secondary | ICD-10-CM | POA: Diagnosis not present

## 2022-05-17 DIAGNOSIS — G20 Parkinson's disease: Secondary | ICD-10-CM | POA: Diagnosis not present

## 2022-05-27 DIAGNOSIS — Z0100 Encounter for examination of eyes and vision without abnormal findings: Secondary | ICD-10-CM | POA: Diagnosis not present

## 2022-05-27 DIAGNOSIS — H5213 Myopia, bilateral: Secondary | ICD-10-CM | POA: Diagnosis not present

## 2022-07-11 ENCOUNTER — Other Ambulatory Visit: Payer: Self-pay | Admitting: Neurology

## 2022-07-11 DIAGNOSIS — G20A1 Parkinson's disease without dyskinesia, without mention of fluctuations: Secondary | ICD-10-CM

## 2022-07-12 ENCOUNTER — Other Ambulatory Visit: Payer: Self-pay | Admitting: Neurology

## 2022-07-12 DIAGNOSIS — G20A1 Parkinson's disease without dyskinesia, without mention of fluctuations: Secondary | ICD-10-CM

## 2022-07-24 NOTE — Progress Notes (Signed)
Assessment/Plan:   1.  Parkinsons Disease  -Continue carbidopa/levodopa 25/100, 1 tablet 3 times per day.  She looks great today.  -She was following with Dr. Denna Haggard.  Because that office closed, she knows she needs to find new dermatology.  New recommendations given  -asks questions and answered to best of my ability  2.  Hx of Sciatica/low back pain  -Following with Dr. Tamala Julian and Dr. Glennon Mac.  Treatment with OMM in past but sx's are better and she hasn't needed to go lately Subjective:   Jennifer Lutz was seen today in follow up for Parkinsons disease.  My previous records were reviewed prior to todays visit as well as outside records available to me.  She is doing well with levodopa.  She is faithful with the medication.  No falls.  She is working part time.  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  Mood has been good.  No LBP lately  Current prescribed movement disorder medications: Carbidopa/levodopa 25/100, 1 tablet 3 times per day   ALLERGIES:  No Known Allergies  CURRENT MEDICATIONS:  Outpatient Encounter Medications as of 07/28/2022  Medication Sig   aspirin 81 MG tablet Take 81 mg by mouth daily.   carbidopa-levodopa (SINEMET IR) 25-100 MG tablet TAKE 1 TABLET BY MOUTH THREE TIMES DAILY (7  AM/11  AM/4  PM)   magnesium 30 MG tablet Take 30 mg by mouth daily.   Omega-3 Fatty Acids (OMEGA-3 FISH OIL) 500 MG CAPS Take 50 mg by mouth.   OVER THE COUNTER MEDICATION Calcium with D3 500 mg   [DISCONTINUED] TART CHERRY PO Take 1 tablet by mouth daily. (Patient not taking: Reported on 07/28/2022)   No facility-administered encounter medications on file as of 07/28/2022.    Objective:   PHYSICAL EXAMINATION:    VITALS:   Vitals:   07/28/22 0808  BP: 128/80  Pulse: 62  SpO2: 99%  Weight: 89 lb 6.4 oz (40.6 kg)  Height: 4\' 11"  (1.499 m)       GEN:  The patient appears stated age and is in NAD.  She is pacing in the room  b/c of sciatica/piriformis syndrome. HEENT:  Normocephalic, atraumatic.  The mucous membranes are moist.   Neurological examination:  Orientation: The patient is alert and oriented x3. Cranial nerves: There is good facial symmetry with facial hypomimia. The speech is fluent and clear. Soft palate rises symmetrically and there is no tongue deviation. Hearing is intact to conversational tone. Sensation: Sensation is intact to light touch throughout Motor: Strength is at least antigravity x4.  Movement examination: Tone: There is normal tone in the UE Abnormal movements: there is no tremor Coordination:  There is no decremation, with any form of RAMS, including alternating supination and pronation of the forearm, hand opening and closing, finger taps, heel taps and toe taps. Gait and Station: The patient ambulates well in the hall with good arm swing.    I have reviewed and interpreted the following labs independently    Chemistry      Component Value Date/Time   NA 141 02/20/2022 0841   K 4.4 02/20/2022 0841   CL 104 02/20/2022 0841   CO2 29 02/20/2022 0841   BUN 21 02/20/2022 0841   CREATININE 1.02 02/20/2022 0841      Component Value Date/Time   CALCIUM 9.2 02/20/2022 0841   ALKPHOS 47 02/20/2022 0841   AST 22 02/20/2022 0841   ALT 3 02/20/2022  0841   BILITOT 0.8 02/20/2022 0841       Lab Results  Component Value Date   WBC 4.2 02/20/2022   HGB 12.8 02/20/2022   HCT 38.6 02/20/2022   MCV 97.9 02/20/2022   PLT 209.0 02/20/2022    Lab Results  Component Value Date   TSH 1.10 02/20/2022      Cc:  Deeann Saint, MD

## 2022-07-28 ENCOUNTER — Ambulatory Visit: Payer: Medicare HMO | Admitting: Neurology

## 2022-07-28 VITALS — BP 128/80 | HR 62 | Ht 59.0 in | Wt 89.4 lb

## 2022-07-28 DIAGNOSIS — G20A1 Parkinson's disease without dyskinesia, without mention of fluctuations: Secondary | ICD-10-CM | POA: Diagnosis not present

## 2022-07-28 MED ORDER — CARBIDOPA-LEVODOPA 25-100 MG PO TABS: ORAL_TABLET | ORAL | 1 refills | Status: DC

## 2022-07-28 NOTE — Patient Instructions (Addendum)
As we discussed, it used to be thought that levodopa would increase risk of melanoma but now it is believed that Parkinsons itself likely increases risk of melanoma. I recommend yearly skin checks with a board certified dermatologist.  You can call [facility] or [facility] for an appointment.  [facility] Address: [address], [place], [place] [address] Phone: [phone]  [facility] [place], [place], [place] Phone: [phone]  Local and Online Resources for Power over Parkinson's Group  November 2023    [place] over Parkinson's Group:    Power Over Parkinson's Patient Education Group will be Wednesday, November 8th-*Hybrid meting*- in person at [facility] location and via [facility], 2:00-3:00 pm.   Starting in November, Power over [employer] and Care Partner Groups will meet together, with plans for separate break out session for caregivers (*this will be evolving over the next few months) Upcoming Power over Parkinson's Meetings/Care Partner Support:  2nd Wednesdays of the month at 2 pm:   November 8th, December 13th  [place] at [email or web address] if interested in participating in this group    [place] and Fall Prevention Workshop.  Thursday, November 9th 1-2pm, Studio [place], [employer].  Register with Vonna Kotyk at [email or web address] or [phone] New PWR! Moves Dynegy Instructor-Led Classes offering at [employer]!  TUESDAYS and Wednesdays 1-2 pm.   Contact Vonna Kotyk at  [email or web address]  or [phone] (Tuesday classes are modified for chair and standing only) Dance for Parkinson 's classes will be on Tuesdays 9:30am-10:30am starting October 3-December 12 with a break the week of November 21st. Located in the [employer], in the first floor of the [employer] ([place].) To register:  [email or web address] or [phone]  Drumming for Parkinson's will be held on 2nd and 4th Mondays at 11:00 am.   Located at the [place] ([place].)  [place] at [email or web address] or [phone]  Through support from the [place] for Parkinson's classes are free for both patients and caregivers.    [facility] [employer] Parkinson's Tai Chi Class, Mondays at 11 am.  Call [phone] for details Parkinson's Holiday Party.  Wednesday, December 6th, 4:00-5:00 pm.  [facility] and Fitness.  RSVP to Garnetta Buddy at [phone] or [email or web address]   [place]:  [email or web address]  PD Health at Home continues:  Mindfulness Mondays, Wellness Wednesdays, Fitness Fridays   Upcoming Education:   Why Should you Participate in Parkinson's Research?  Wednesday, Nov. 29th,  1-2 pm  Expert Briefing:    Hallucinations and Delusions in Parkinson's.  Wednesday, Nov. 8th, 1-2 pm  Register for expert briefings (webinars) at [email or web address]  Please check out their website to sign up for emails and see their full online offerings      [place]:  [email or web address]   Third Thursday Webinars:  On the third Thursday of every month at 12 p.m. ET, join our free live webinars to learn about various aspects of living with Parkinson's disease and our work to speed medical breakthroughs.  Upcoming Webinar:  A Year Like No Other in Parkinson's Research:  2023 in Review.  Thursday, November 16th 12 noon. Check out additional information on their website to see their full online offerings    [facility]:  [email or web address]  Upcoming Webinar:   Stay tuned  Webinar  Series:  Living with Parkinson's Meetup.   Third Thursdays each month, 3 pm  Care Partner Monthly Meetup.  With Robin Searing Phinney.  First Tuesday of each month, 2 pm  Check out additional information to Live Well Today on their website    Parkinson and Movement Disorders (PMD) Alliance:  www.[email or web address].org  [employer]:  Online Education Events  Sign up for emails, which are sent weekly to give you updates on programming and online offerings    Parkinson's Association of the [place]:  www.[email or web address]  Information on online support groups, education events, and online exercises including Yoga, Parkinson's exercises and more-LOTS of information on links to PD resources and online events  Virtual Support Group through Parkinson's Association of the [place]; next one is scheduled for Wednesday, November 1st  at 2 pm.  (These are typically scheduled for the 1st Wednesday of the month at 2 pm).  Visit website for details.   MOVEMENT AND EXERCISE OPPORTUNITIES  PWR! Moves Classes at [place].  Wednesdays 10 and 11 am.   Contact Amy Marriott, PT [email or web address] if interested.  NEW PWR! Moves Class offerings at [employer].  *TUESDAYS* and Wednesdays 1-2 pm.  Contact Vonna Kotyk at  [email or web address]    Parkinson's Wellness Recovery (PWR! Moves)  www.[email or web address]  Info on the PWR! Virtual Experience:  You will have access to our expertise?through self-assessment, guided plans that start with the PD-specific fundamentals, educational content, tips, Q&A with an expert, and a growing Art therapist of PD-specific pre-recorded and live exercise classes of varying types and intensity - both physical and cognitive! If that is not enough, we offer 1:1 wellness consultations (in-person or virtual) to personalize your PWR! Research scientist (medical).   [facility] Fridays:   As part of the PD Health @ Home program, this free video  series focuses each week on one aspect of fitness designed to support people living with Parkinson's.? These weekly videos highlight the [place] fitness guidelines for people with Parkinson's disease.  [email or web address]   Dance for PD website is offering free, live-stream classes throughout the week, as well as links to [employer] of classes:  [email or web address]  Virtual dance and Pilates for Parkinson's classes: Click on the Community Tab> Parkinson's Movement Initiative Tab.  To register for classes and for more information, visit [email or web address] and click the "community" tab.   [employer] Parkinson's Cycling Classes   [facility] [employer]:  Thursdays @ Noon-Live classes at [employer] ([employer] at [email or web address]?or [phone])  [facility] [employer]: Virtual Classes Mondays and Thursdays Jeanette Caprice classes Tuesday, Wednesday and Thursday (contact [place] at [email or web address] ?or [phone])  [place]  Varied levels of classes are offered Tuesdays and Thursdays at [employer].   Stretching with Verdis Frederickson weekly class is also offered for people with Parkinson's  To observe a class or for more information, call [phone] or email Hezzie Bump at [email or web address]   ADDITIONAL SUPPORT AND RESOURCES  Well-Spring [employer]:Online Caregiver Education Opportunities:  www.well-springsolutions.org/caregiver-education/caregiver-support-group.  You may also contact Vickki Muff at [email or web address] or [phone].     Well-Spring Navigator:  Just1Navigator program, a?free service to help individuals and families through the journey of determining care for older adults.  The "Navigator" is a Education officer, museum, Arnell Asal, who will speak with a prospective client and/or loved ones to provide an assessment of the situation and a set of  recommendations for a personalized care plan -- all free of charge, and whether?Well-Spring  Solutions offers the needed service or not. If the need is not a service we provide, we are well-connected with reputable programs in town that we can refer you to.  www.well-springsolutions.org or to speak with the Navigator, call [phone].

## 2022-08-22 ENCOUNTER — Encounter: Payer: Self-pay | Admitting: Neurology

## 2022-09-03 ENCOUNTER — Encounter: Payer: Self-pay | Admitting: Family Medicine

## 2022-09-03 ENCOUNTER — Ambulatory Visit: Payer: Medicare HMO | Admitting: Family Medicine

## 2022-09-03 ENCOUNTER — Ambulatory Visit (INDEPENDENT_AMBULATORY_CARE_PROVIDER_SITE_OTHER): Payer: Medicare HMO

## 2022-09-03 VITALS — BP 112/64 | HR 63 | Ht 59.0 in | Wt 90.0 lb

## 2022-09-03 DIAGNOSIS — M5441 Lumbago with sciatica, right side: Secondary | ICD-10-CM

## 2022-09-03 DIAGNOSIS — G8929 Other chronic pain: Secondary | ICD-10-CM | POA: Diagnosis not present

## 2022-09-03 DIAGNOSIS — M1611 Unilateral primary osteoarthritis, right hip: Secondary | ICD-10-CM | POA: Diagnosis not present

## 2022-09-03 DIAGNOSIS — M545 Low back pain, unspecified: Secondary | ICD-10-CM | POA: Diagnosis not present

## 2022-09-03 DIAGNOSIS — M25551 Pain in right hip: Secondary | ICD-10-CM

## 2022-09-03 MED ORDER — GABAPENTIN 100 MG PO CAPS: 200.0000 mg | ORAL_CAPSULE | Freq: Every day | ORAL | 0 refills | Status: DC

## 2022-09-03 MED ORDER — METHYLPREDNISOLONE ACETATE 40 MG/ML IJ SUSP
40.0000 mg | Freq: Once | INTRAMUSCULAR | Status: AC
  Administered 2022-09-03: 40 mg via INTRAMUSCULAR

## 2022-09-03 MED ORDER — KETOROLAC TROMETHAMINE 30 MG/ML IJ SOLN
30.0000 mg | Freq: Once | INTRAMUSCULAR | Status: AC
  Administered 2022-09-03: 30 mg via INTRAMUSCULAR

## 2022-09-03 NOTE — Progress Notes (Signed)
[name] [address] [place] [address] Phone: [phone] Subjective:    I'm seeing this patient by the request  of:  Deeann Saint, MD  CC: right hip pain   PIR:[id number]  Leonette Shamar Kracke is a 71 y.o. female coming in with complaint of R hip pain. Last seen in September 2022 for OMT. Patient has been seeing Dr. Jean Rosenthal. Last week her pain increased. Radiates down the leg to the knee. 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 Believes this motion may have exacerbated her pain.        Past Medical History:  Diagnosis Date   Chronic kidney disease    reduced kidney function   Complication of anesthesia    'takes very little.' 'Hard to wake up'   Degenerative disc disease, lumbar    Hyperlipidemia    no meds- diet controlled   Medical history non-contributory    Neuromuscular disorder (HCC)    Parkinson's disease   Past Surgical History:  Procedure Laterality Date   COLONOSCOPY  2008   FASCIAL DEFECT REPAIR Left 1983   left thigh   NASAL SEPTUM SURGERY  09/23/1983   WRIST ARTHROSCOPY WITH ULNA SHORTENING Left 06/07/2015   Procedure: ARTHROSCOPY LEFT WRIST DEBRIDEMENT ULNAR SHORTENING;  Surgeon: Cindee Salt, MD;  Location: [facility];  Service: Orthopedics;  Laterality: Left;   WRIST OSTEOTOMY Left 06/07/2015   Procedure: LEFT OSTEOTOMY;  Surgeon: Cindee Salt, MD;  Location: [facility];  Service: Orthopedics;  Laterality: Left;   Social History   Socioeconomic History   Marital status: Divorced    Spouse name: Not on file   Number of children: Not on file   Years of education: Not on file   Highest education level: Not on file  Occupational History   Not on file  Tobacco Use   Smoking status: Never   Smokeless tobacco: Never  Vaping Use   Vaping Use: Never used  Substance and Sexual Activity   Alcohol use:  Yes    Comment: 2 glasses qd   Drug use: No   Sexual activity: Not on file  Other Topics Concern   Not on file  Social History Narrative   Not on file   Social Determinants of Health   Financial Resource Strain: Low Risk  (04/01/2022)   Overall Financial Resource Strain (CARDIA)    Difficulty of Paying Living Expenses: Not hard at all  Food Insecurity: No Food Insecurity (04/01/2022)   Hunger Vital Sign    Worried About Running Out of Food in the Last Year: Never true    Ran Out of Food in the Last Year: Never true  Transportation Needs: No Transportation Needs (04/01/2022)   PRAPARE - Administrator, Civil Service (Medical): No    Lack of Transportation (Non-Medical): No  Physical Activity: Sufficiently Active (04/01/2022)   Exercise Vital Sign    Days of Exercise per Week: 3 days    Minutes of Exercise per Session: 150+ min  Stress: No Stress Concern Present (04/01/2022)   [employer] of Occupational Health - Occupational Stress Questionnaire    Feeling of Stress : Not at all  Social Connections: Moderately Integrated (04/01/2022)   Social Connection and Isolation Panel [NHANES]    Frequency of Communication with Friends and Family: More than three times a week  Frequency of Social Gatherings with Friends and Family: More than three times a week    Attends Religious Services: More than 4 times per year    Active Member of Clubs or Organizations: Yes    Attends Engineer, structural: More than 4 times per year    Marital Status: Divorced   No Known Allergies Family History  Problem Relation Age of Onset   Stroke Mother    Colon polyps Brother    Stroke Other    Colon cancer Neg Hx    Esophageal cancer Neg Hx    Stomach cancer Neg Hx    Rectal cancer Neg Hx        Current Outpatient Medications (Analgesics):    aspirin 81 MG tablet, Take 81 mg by mouth daily.   Current Outpatient Medications (Other):    carbidopa-levodopa (SINEMET IR)  25-100 MG tablet, 1 at 7am/11am/4pm   gabapentin (NEURONTIN) 100 MG capsule, Take 2 capsules (200 mg total) by mouth at bedtime.   magnesium 30 MG tablet, Take 30 mg by mouth daily.   Omega-3 Fatty Acids (OMEGA-3 FISH OIL) 500 MG CAPS, Take 50 mg by mouth.   OVER THE COUNTER MEDICATION, Calcium with D3 500 mg   Reviewed prior external information including notes and imaging from  primary care provider As well as notes that were available from care everywhere and other healthcare systems.  Past medical history, social, surgical and family history all reviewed in electronic medical record.  No pertanent information unless stated regarding to the chief complaint.   Review of Systems:  No headache, visual changes, nausea, vomiting, diarrhea, constipation, dizziness, abdominal pain, skin rash, fevers, chills, night sweats, weight loss, swollen lymph nodes,  joint swelling, chest pain, shortness of breath, mood changes. POSITIVE muscle aches, body aches  Objective  Blood pressure 112/64, pulse 63, height 4\' 11"  (1.499 m), weight 90 lb (40.8 kg), SpO2 99 %.   General: No apparent distress alert and oriented x3 mood and affect normal, dressed appropriately.  HEENT: Pupils equal, extraocular movements intact  Respiratory: Patient's speak in full sentences and does not appear short of breath  Cardiovascular: No lower extremity edema, non tender, no erythema  Tightness noted overall  Patient does have severe tenderness over the sacroiliac joint.  Positive FABER test noted.  Patient is severely tender over the sacroiliac joint.  Negative straight leg test with activities but states that patient does have pain over the lateral aspect of the knee.   Impression and Recommendations:    The above documentation has been reviewed and is accurate and complete , DO

## 2022-09-03 NOTE — Assessment & Plan Note (Signed)
Seems to be having no radicular symptoms at the moment.  I do believe the patient's underlying Parkinson's is also playing a role.  We could have attempted osteopathic manipulation but secondary to the severity of pain will do Toradol and Depo-Medrol.  We discussed starting gabapentin at a very low-dose secondary to the radicular symptoms.  Discussed which activities to do and which ones to avoid.  Increase activity slowly and follow-up with me again 6 to 8 weeks.

## 2022-09-03 NOTE — Patient Instructions (Signed)
Xray today Injections in backside Gabapentin 200mg  at night See me again in 4 weeks if you need me

## 2022-10-01 NOTE — Progress Notes (Signed)
[facility] [place] Phone: [phone] Subjective:    I'm seeing this patient by the request  of:  Billie Ruddy, MD  CC: Back pain follow-up  WGN:[id number]  09/03/2022 Seems to be having no radicular symptoms at the moment. I do believe the patient's underlying Parkinson's is also playing a role. We could have attempted osteopathic manipulation but secondary to the severity of pain will do Toradol and Depo-Medrol. We discussed starting gabapentin at a very low-dose secondary to the radicular symptoms. Discussed which activities to do and which ones to avoid. Increase activity slowly and follow-up with me again 6 to 8 weeks.   Update 10/07/2022 Jennifer Lutz is a 72 y.o. female coming in with complaint of LBP and R hip pain. Patient states that she is doing a lot better but is not wanting to start the gabapentin because of the myriad of side effects it has. Patient has been doing her exercises religiously every other day and they are helping. Patient states that the injections in the backside didn't make her feel like super women but she has been doing better.  Would state overall feeling approximately 50% better      Past Medical History:  Diagnosis Date   Chronic kidney disease    reduced kidney function   Complication of anesthesia    'takes very little.' 'Hard to wake up'   Degenerative disc disease, lumbar    Hyperlipidemia    no meds- diet controlled   Medical history non-contributory    Neuromuscular disorder ([place])    Parkinson's disease   Past Surgical History:  Procedure Laterality Date   COLONOSCOPY  2008   FASCIAL DEFECT REPAIR Left 1983   left thigh   NASAL SEPTUM SURGERY  09/23/1983   WRIST ARTHROSCOPY WITH ULNA SHORTENING Left 06/07/2015   Procedure: ARTHROSCOPY LEFT WRIST DEBRIDEMENT ULNAR SHORTENING;  Surgeon: Daryll Brod, MD;  Location: [facility];  Service: Orthopedics;   Laterality: Left;   WRIST OSTEOTOMY Left 06/07/2015   Procedure: LEFT OSTEOTOMY;  Surgeon: Daryll Brod, MD;  Location: [facility];  Service: Orthopedics;  Laterality: Left;   Social History   Socioeconomic History   Marital status: Divorced    Spouse name: Not on file   Number of children: Not on file   Years of education: Not on file   Highest education level: Not on file  Occupational History   Not on file  Tobacco Use   Smoking status: Never   Smokeless tobacco: Never  Vaping Use   Vaping Use: Never used  Substance and Sexual Activity   Alcohol use: Yes    Comment: 2 glasses qd   Drug use: No   Sexual activity: Not on file  Other Topics Concern   Not on file  Social History Narrative   Not on file   Social Determinants of Health   Financial Resource Strain: Low Risk  (04/01/2022)   Overall Financial Resource Strain (CARDIA)    Difficulty of Paying Living Expenses: Not hard at all  Food Insecurity: No Food Insecurity (04/01/2022)   Hunger Vital Sign    Worried About Running Out of Food in the Last Year: Never true    Ran Out of Food in the Last Year: Never true  Transportation Needs: No Transportation Needs (04/01/2022)   PRAPARE - Hydrologist (Medical): No    Lack of Transportation (Non-Medical): No  Physical  Activity: Sufficiently Active (04/01/2022)   Exercise Vital Sign    Days of Exercise per Week: 3 days    Minutes of Exercise per Session: 150+ min  Stress: No Stress Concern Present (04/01/2022)   [place]    Feeling of Stress : Not at all  Social Connections: Moderately Integrated (04/01/2022)   Social Connection and Isolation Panel [NHANES]    Frequency of Communication with Friends and Family: More than three times a week    Frequency of Social Gatherings with Friends and Family: More than three times a week    Attends Religious Services: More than 4  times per year    Active Member of [employer] or Organizations: Yes    Attends Music therapist: More than 4 times per year    Marital Status: Divorced   No Known Allergies Family History  Problem Relation Age of Onset   Stroke Mother    Colon polyps Brother    Stroke Other    Colon cancer Neg Hx    Esophageal cancer Neg Hx    Stomach cancer Neg Hx    Rectal cancer Neg Hx        Current Outpatient Medications (Analgesics):    aspirin 81 MG tablet, Take 81 mg by mouth daily.   Current Outpatient Medications (Other):    carbidopa-levodopa (SINEMET IR) 25-100 MG tablet, 1 at 7am/11am/4pm   gabapentin (NEURONTIN) 100 MG capsule, Take 2 capsules (200 mg total) by mouth at bedtime.   magnesium 30 MG tablet, Take 30 mg by mouth daily.   Omega-3 Fatty Acids (OMEGA-3 FISH OIL) 500 MG CAPS, Take 50 mg by mouth.   OVER THE COUNTER MEDICATION, Calcium with D3 500 mg   Reviewed prior external information including notes and imaging from  primary care provider As well as notes that were available from care everywhere and other healthcare systems.  Past medical history, social, surgical and family history all reviewed in electronic medical record.  No pertanent information unless stated regarding to the chief complaint.   Review of Systems:  No headache, visual changes, nausea, vomiting, diarrhea, constipation, dizziness, abdominal pain, skin rash, fevers, chills, night sweats, weight loss, swollen lymph nodes, body aches, joint swelling, chest pain, shortness of breath, mood changes. POSITIVE muscle aches  Objective  Blood pressure 110/70, pulse 68, height 4\' 11"  (1.499 m), weight 90 lb (40.8 kg), SpO2 98 %.   General: No apparent distress alert and oriented x3 mood and affect normal, dressed appropriately.  HEENT: Pupils equal, extraocular movements intact  Respiratory: Patient's speak in full sentences and does not appear short of breath  Cardiovascular: No lower extremity  edema, non tender, no erythema  Low back exam does have some loss of lordosis.  No improvement in range of motion in all planes.  Patient also still does have some hypertonicity of multiple muscles.  Does have some limited though range of motion of the hips left greater than right.  Mild shuffling gait noted    Impression and Recommendations:    The above documentation has been reviewed and is accurate and complete Lyndal Pulley, DO

## 2022-10-07 ENCOUNTER — Ambulatory Visit: Payer: Medicare HMO | Admitting: Family Medicine

## 2022-10-07 VITALS — BP 110/70 | HR 68 | Ht 59.0 in | Wt 90.0 lb

## 2022-10-07 DIAGNOSIS — M5441 Lumbago with sciatica, right side: Secondary | ICD-10-CM | POA: Diagnosis not present

## 2022-10-07 DIAGNOSIS — G8929 Other chronic pain: Secondary | ICD-10-CM

## 2022-10-07 NOTE — Assessment & Plan Note (Signed)
Low back pain, doing relatively well overall at the moment.  States that she is approximately 50% better.  At this point patient wants to continue with the conservative therapy with the home exercises, icing regimen, core strengthening and follow-up with me more on an as-needed basis.  Patient knows if worsening pain to come back sooner.

## 2022-10-13 DIAGNOSIS — Z1283 Encounter for screening for malignant neoplasm of skin: Secondary | ICD-10-CM | POA: Diagnosis not present

## 2022-10-13 DIAGNOSIS — D225 Melanocytic nevi of trunk: Secondary | ICD-10-CM | POA: Diagnosis not present

## 2022-12-03 ENCOUNTER — Ambulatory Visit: Payer: Medicare HMO | Admitting: Dermatology

## 2022-12-17 ENCOUNTER — Ambulatory Visit (INDEPENDENT_AMBULATORY_CARE_PROVIDER_SITE_OTHER): Payer: Medicare HMO | Admitting: Family Medicine

## 2022-12-17 ENCOUNTER — Encounter: Payer: Self-pay | Admitting: Family Medicine

## 2022-12-17 VITALS — BP 144/80 | HR 67 | Temp 97.9°F | Ht 59.0 in | Wt 93.0 lb

## 2022-12-17 DIAGNOSIS — G20A1 Parkinson's disease without dyskinesia, without mention of fluctuations: Secondary | ICD-10-CM

## 2022-12-17 DIAGNOSIS — K644 Residual hemorrhoidal skin tags: Secondary | ICD-10-CM

## 2022-12-17 DIAGNOSIS — L304 Erythema intertrigo: Secondary | ICD-10-CM | POA: Diagnosis not present

## 2022-12-17 DIAGNOSIS — R58 Hemorrhage, not elsewhere classified: Secondary | ICD-10-CM | POA: Diagnosis not present

## 2022-12-17 MED ORDER — KETOCONAZOLE 2 % EX CREA: 1.0000 | TOPICAL_CREAM | Freq: Every day | CUTANEOUS | 0 refills | Status: DC

## 2022-12-17 NOTE — Progress Notes (Signed)
Established Patient Office Visit   Subjective  Patient ID: Jennifer Lutz, female    DOB: October 16, 1950  Age: 72 y.o. MRN: [id number]  Chief Complaint  Patient presents with   Blood In Stools    Patient complains of blood in stool, x2 days     Pt is  a 72 yo female with pmh sig for parkinson's dz., loss of smell due to parkinson's, osteoporosis who was seen for acute concern.  Pt noticed a small drop of blood on toilet tissue Monday and again today.  Has a picture on her phone with a pinpoint drop of blood.  Pt denies blood in toilet or on stools, no constipation, or having to strain with BMs.  May have 2 BMs per day.  Pt notes recent BM was "prickly coming out".  Pt endorses really wiping to ensure she is clean after a BM.  Denies lightheadedness, n/v.  Has noticed increased flatus.  Unsure if malodorous due to loss of smell from Parkinson's.  Colonoscopy on 10/28/21 was normal.  Pt states she no longer had a tremor in R hand since being on sinemet TID.  Notes improvement in balance. Was able to climb a ladder to change an air filter without hesitation or issue.       ROS Negative unless stated above    Objective:     BP (!) 144/80   Pulse 67   Temp 97.9 F (36.6 C) (Oral)   Ht 4\' 11"  (1.499 m)   Wt 93 lb (42.2 kg)   SpO2 98%   BMI 18.78 kg/m    Physical Exam Constitutional:      General: She is not in acute distress.    Appearance: Normal appearance.  HENT:     Head: Normocephalic and atraumatic.     Nose: Nose normal.     Mouth/Throat:     Mouth: Mucous membranes are moist.  Eyes:     Extraocular Movements: Extraocular movements intact.     Conjunctiva/sclera: Conjunctivae normal.  Cardiovascular:     Rate and Rhythm: Normal rate.     Heart sounds: No murmur heard.    No gallop.  Pulmonary:     Effort: Pulmonary effort is normal. No respiratory distress.     Breath sounds: No wheezing, rhonchi or rales.  Genitourinary:    Comments: Intertrigo in  gluteal cleft.  Hemorrhoidal skin tag.  Rectal exam deferred. Skin:    General: Skin is warm and dry.  Neurological:     Mental Status: She is alert and oriented to person, place, and time.      No results found for any visits on 12/17/22.    Assessment & Plan:  Hemorrhoidal skin tag  Intertrigo -     Ketoconazole; Apply 1 Application topically daily.  Dispense: 15 g; Refill: 0  Blood on toilet paper  Parkinson's disease without dyskinesia or fluctuating manifestations -Continue Sinemet IR 25-100 mg 3 times daily -Continue exercises to strengthen core and help with balance -Continue follow-up with neurology, Dr. Carles Collet  Blood on toilet paper minimal.   Likely 2/2 skin abrasion/irritation from firmer BM and increased wiping with toilet paper to ensure cleanliness.  Start ketoconazole cream for intertrigo.  Discussed avoiding tight clothing then increase the sweating to prevent further rash.  Hemorrhoidal skin tag noted on exam, asymptomatic.  Continue to monitor.  For continued or worsening symptoms referral to GI.  Return if symptoms worsen or fail to improve.   Billie Ruddy, MD

## 2023-01-16 ENCOUNTER — Other Ambulatory Visit: Payer: Self-pay | Admitting: Neurology

## 2023-01-16 DIAGNOSIS — G20A1 Parkinson's disease without dyskinesia, without mention of fluctuations: Secondary | ICD-10-CM

## 2023-01-19 ENCOUNTER — Ambulatory Visit (INDEPENDENT_AMBULATORY_CARE_PROVIDER_SITE_OTHER): Payer: Medicare HMO | Admitting: Family Medicine

## 2023-01-19 ENCOUNTER — Encounter: Payer: Self-pay | Admitting: Family Medicine

## 2023-01-19 VITALS — BP 162/74 | HR 64 | Temp 97.5°F | Ht 59.0 in | Wt 92.8 lb

## 2023-01-19 DIAGNOSIS — I10 Essential (primary) hypertension: Secondary | ICD-10-CM

## 2023-01-19 DIAGNOSIS — G20A1 Parkinson's disease without dyskinesia, without mention of fluctuations: Secondary | ICD-10-CM

## 2023-01-19 DIAGNOSIS — S46211A Strain of muscle, fascia and tendon of other parts of biceps, right arm, initial encounter: Secondary | ICD-10-CM | POA: Diagnosis not present

## 2023-01-19 DIAGNOSIS — M79601 Pain in right arm: Secondary | ICD-10-CM

## 2023-01-19 NOTE — Addendum Note (Signed)
Addended by: Elwin Mocha on: 01/19/2023 02:42 PM   Modules accepted: Orders

## 2023-01-19 NOTE — Progress Notes (Signed)
Established Patient Office Visit   Subjective  Patient ID: Jennifer Lutz, female    DOB: [birth year]  Age: 72 y.o. MRN: [id number]  Chief Complaint  Patient presents with   Arm Pain    Pt c/o ache pain on R upper arm moves down to lower arm. Going on for 3 wks. Worsen with arm motion and arm usage.     Patient is a 72 year old female with pmh sig for Parkinson's disease, osteoporosis, HDL who was seen for ongoing concern.  Patient endorses right upper arm pain x 3 weeks.  Does not recall injury.  Pain worse by the end of the day.  Described as an ache that at times becomes a jab.  Pain can travel from mid upper arm to forearm.  Feels like a muscle soreness at times.  Has pain with movement of arm.  Pt tried Advil Salonpas, Biofreeze.  Patient is right-handed.  Having to change the way she completes task at work due to the discomfort.  Patient works at a [employer].  Arm Pain     Patient Active Problem List   Diagnosis Date Noted   Low back pain 07/11/2020   Nonallopathic lesion of thoracic region 07/11/2020   Nonallopathic lesion of lumbar region 07/11/2020   Nonallopathic lesion of sacral region 07/11/2020   Parkinson's disease 01/05/2020   Borderline high cholesterol 09/07/2017   Routine general medical examination at a health care facility 12/26/2013   OSTEOPOROSIS, IDIOPATHIC 07/08/2007   Past Surgical History:  Procedure Laterality Date   COLONOSCOPY  2008   FASCIAL DEFECT REPAIR Left 1983   left thigh   NASAL SEPTUM SURGERY  09/23/1983   WRIST ARTHROSCOPY WITH ULNA SHORTENING Left 06/07/2015   Procedure: ARTHROSCOPY LEFT WRIST DEBRIDEMENT ULNAR SHORTENING;  Surgeon: Cindee Salt, MD;  Location: [facility];  Service: Orthopedics;  Laterality: Left;   WRIST OSTEOTOMY Left 06/07/2015   Procedure: LEFT OSTEOTOMY;  Surgeon: Cindee Salt, MD;  Location:  SURGERY CENTER;  Service: Orthopedics;  Laterality: Left;   Social History   Tobacco  Use   Smoking status: Never   Smokeless tobacco: Never  Vaping Use   Vaping Use: Never used  Substance Use Topics   Alcohol use: Yes    Comment: 2 glasses qd   Drug use: No   Family History  Problem Relation Age of Onset   Stroke Mother    Colon polyps Brother    Stroke Other    Colon cancer Neg Hx    Esophageal cancer Neg Hx    Stomach cancer Neg Hx    Rectal cancer Neg Hx    No Known Allergies    ROS Negative unless stated above    Objective:     BP (!) 162/74 (BP Location: Left Arm, Cuff Size: Normal)   Pulse 64   Temp (!) 97.5 F (36.4 C) (Oral)   Ht 4\' 11"  (1.499 m)   Wt 92 lb 12.8 oz (42.1 kg)   SpO2 99%   BMI 18.74 kg/m  BP Readings from Last 3 Encounters:  01/19/23 (!) 162/74  12/17/22 (!) 144/80  10/07/22 110/70   Wt Readings from Last 3 Encounters:  01/19/23 92 lb 12.8 oz (42.1 kg)  12/17/22 93 lb (42.2 kg)  10/07/22 90 lb (40.8 kg)      Physical Exam Constitutional:      General: She is not in acute distress.    Appearance: Normal appearance.  HENT:  Head: Normocephalic and atraumatic.     Nose: Nose normal.     Mouth/Throat:     Mouth: Mucous membranes are moist.  Cardiovascular:     Rate and Rhythm: Normal rate and regular rhythm.     Heart sounds: No murmur heard.    No gallop.  Pulmonary:     Effort: Pulmonary effort is normal. No respiratory distress.     Breath sounds: No wheezing, rhonchi or rales.  Musculoskeletal:     Right upper arm: Tenderness present. No swelling, edema, deformity or bony tenderness.     Left upper arm: Normal.       Arms:     Comments: TTP of R biceps  Skin:    General: Skin is warm and dry.  Neurological:     Mental Status: She is alert and oriented to person, place, and time.      No results found for any visits on 01/19/23.    Assessment & Plan:  Right arm pain -     [place] UPPER EXTREMITY DUPLEX RIGHT (NON-WBI); Future  Strain of right biceps muscle, initial encounter  Essential  hypertension  Parkinson's disease without dyskinesia or fluctuating manifestations  Obtain ultrasound RUE to rule out DVT.  Symptoms possibly due to muscle strain.  Less likely medication induced as not on a statin.  Continue supportive care.  Gabapentin 100 mg nightly as needed.  BP elevation possibly related to pain.  Previously controlled.  No medications.  Encouraged to keep follow-up appointment next week with neurology.  Return if symptoms worsen or fail to improve.   Deeann Saint, MD

## 2023-01-19 NOTE — Patient Instructions (Addendum)
I have included some information about muscle strains.  A referral was placed for the ultrasound to rule out a blood clot as a cause of symptoms.  They should contact you about scheduling this appointment.  You can let them know your availability when they contact you.

## 2023-01-19 NOTE — Progress Notes (Signed)
Assessment/Plan:   1.  Parkinsons Disease  -Continue carbidopa/levodopa 25/100, 1 tablet 3 times per day.  She looks great today.  -She is following with Dr. June Leap  -asks questions and answered to best of my ability  2.  Hx of Sciatica/low back pain  -Following with Dr. Katrinka Blazing and Dr. Jean Rosenthal.  Treatment with [facility] in past but sx's are better and she hasn't needed to go lately  3.  R arm pain  -doesn't sound radicular  -we discussed trialing to increase the levodopa to see if it would help but she decided to hold on that.  I don't disagree as she looks so well treated  -discussed EMG, but again, I don't see anything that looks neuromuscular/degenerative.  She decided to hold on that for now  -she is going to see Dr. Terrilee Files Subjective:   Jennifer Lutz was seen today in follow up for Parkinsons disease.  My previous records were reviewed prior to todays visit as well as outside records available to me.  She takes her levodopa faithfully.  She had one fall yesterday; she was cleaning a golf caught and her pants were wet and it was hard to lift the leg and she fell. She got a bruise on the arm but that was it.   She emailed me back in December with a momentary hallucination that "lasted about a second or even less."  She has had no further events.  She saw primary care last week for arm pain and they stated that they were going to obtain an ultrasound to rule out a DVT but thought it could be muscle strain.  The U/S was negative.  She was given gabapentin as needed but she has not taken it.  She states that the biceps muscle feels so tight on the right x 1 month.  It will sometimes go below the elbow.  Its present most of the time and doesn't change with carbidopa/levodopa dosing.  No neck smith.  She has seen Dr. Katrinka Blazing for piriformis syndrome but that part is better.    Current prescribed movement disorder medications: Carbidopa/levodopa 25/100, 1 tablet 3 times per  day   ALLERGIES:  No Known Allergies  CURRENT MEDICATIONS:  Outpatient Encounter Medications as of 01/26/2023  Medication Sig   aspirin 81 MG tablet Take 81 mg by mouth daily.   carbidopa-levodopa (SINEMET IR) 25-100 MG tablet TAKE 1 TABLET BY MOUTH THREE TIMES DAILY AT  7  AM  /  11  AM  /  4  PM   magnesium 30 MG tablet Take 30 mg by mouth daily.   Omega-3 Fatty Acids (OMEGA-3 FISH OIL) 500 MG CAPS Take 50 mg by mouth.   OVER THE COUNTER MEDICATION Calcium with D3 500 mg   gabapentin (NEURONTIN) 100 MG capsule Take 2 capsules (200 mg total) by mouth at bedtime. (Patient not taking: Reported on 12/17/2022)   No facility-administered encounter medications on file as of 01/26/2023.    Objective:   PHYSICAL EXAMINATION:    VITALS:   Vitals:   01/26/23 0811  BP: 128/80  Pulse: 65  SpO2: 98%  Weight: 92 lb 6.4 oz (41.9 kg)  Height: 4\' 11"  (1.499 m)     GEN:  The patient appears stated age and is in NAD.   HEENT:  Normocephalic, atraumatic.  The mucous membranes are moist.  CV:  RRR Lungs:  CTAB  Neurological examination:  Orientation: The patient is alert and oriented x3.  Cranial nerves: There is good facial symmetry with facial hypomimia. The speech is fluent and clear. Soft palate rises symmetrically and there is no tongue deviation. Hearing is intact to conversational tone. Sensation: Sensation is intact to light touch throughout Motor: Strength is at least antigravity x4.  Movement examination: Tone: There is normal tone in the UE Abnormal movements: there is rare tremor of the R hand/L foot Coordination:  There is no decremation, with any form of RAMS, including alternating supination and pronation of the forearm, hand opening and closing, finger taps, heel taps and toe taps. Gait and Station: The patient ambulates well in the hall with good arm swing.    I have reviewed and interpreted the following labs independently    Chemistry      Component Value Date/Time    NA 141 02/20/2022 0841   K 4.4 02/20/2022 0841   CL 104 02/20/2022 0841   CO2 29 02/20/2022 0841   BUN 21 02/20/2022 0841   CREATININE 1.02 02/20/2022 0841      Component Value Date/Time   CALCIUM 9.2 02/20/2022 0841   ALKPHOS 47 02/20/2022 0841   AST 22 02/20/2022 0841   ALT 3 02/20/2022 0841   BILITOT 0.8 02/20/2022 0841       Lab Results  Component Value Date   WBC 4.2 02/20/2022   HGB 12.8 02/20/2022   HCT 38.6 02/20/2022   MCV 97.9 02/20/2022   PLT 209.0 02/20/2022    Lab Results  Component Value Date   TSH 1.10 02/20/2022      Cc:  Deeann Saint, MD

## 2023-01-20 ENCOUNTER — Ambulatory Visit (HOSPITAL_BASED_OUTPATIENT_CLINIC_OR_DEPARTMENT_OTHER)
Admission: RE | Admit: 2023-01-20 | Discharge: 2023-01-20 | Disposition: A | Discharge status: Home / Self Care | Payer: Medicare HMO | Source: Ambulatory Visit | Attending: Family Medicine | Admitting: Family Medicine

## 2023-01-20 DIAGNOSIS — M79601 Pain in right arm: Secondary | ICD-10-CM

## 2023-01-20 DIAGNOSIS — M79604 Pain in right leg: Secondary | ICD-10-CM | POA: Diagnosis not present

## 2023-01-26 ENCOUNTER — Ambulatory Visit: Payer: Medicare HMO | Admitting: Neurology

## 2023-01-26 ENCOUNTER — Encounter: Payer: Self-pay | Admitting: Neurology

## 2023-01-26 VITALS — BP 128/80 | HR 65 | Ht 59.0 in | Wt 92.4 lb

## 2023-01-26 DIAGNOSIS — M79601 Pain in right arm: Secondary | ICD-10-CM | POA: Diagnosis not present

## 2023-01-26 DIAGNOSIS — G20A1 Parkinson's disease without dyskinesia, without mention of fluctuations: Secondary | ICD-10-CM

## 2023-01-26 NOTE — Progress Notes (Unsigned)
    Jennifer Lutz D.[name] [address] [place] [address] Phone: [phone]   Assessment and Plan:     There are no diagnoses linked to this encounter.  ***   Pertinent previous records reviewed include ***   Follow Up: ***     Subjective:   I, Jennifer Lutz, am serving as a Neurosurgeon for Jennifer Lutz  Chief Complaint: right arm pain   HPI:  01/27/2023 Patient is a 72 year old female complaining of right arm pain. Patients states  Relevant Historical Information: ***  Additional pertinent review of systems negative.   Current Outpatient Medications:    aspirin 81 MG tablet, Take 81 mg by mouth daily., Disp: , Rfl:    carbidopa-levodopa (SINEMET IR) 25-100 MG tablet, TAKE 1 TABLET BY MOUTH THREE TIMES DAILY AT  7  AM  /  11  AM  /  4  PM, Disp: 270 tablet, Rfl: 0   gabapentin (NEURONTIN) 100 MG capsule, Take 2 capsules (200 mg total) by mouth at bedtime. (Patient not taking: Reported on 12/17/2022), Disp: 180 capsule, Rfl: 0   magnesium 30 MG tablet, Take 30 mg by mouth daily., Disp: , Rfl:    Omega-3 Fatty Acids (OMEGA-3 FISH OIL) 500 MG CAPS, Take 50 mg by mouth., Disp: , Rfl:    OVER THE COUNTER MEDICATION, Calcium with D3 500 mg, Disp: , Rfl:    Objective:     There were no vitals filed for this visit.    There is no height or weight on file to calculate BMI.    Physical Exam:    ***   Electronically signed by:  Jennifer Lutz [name] 12:23 PM 01/26/23

## 2023-01-26 NOTE — Patient Instructions (Addendum)
Good to see you today!  You look great!  The physicians and staff at [facility] are committed to providing excellent care. You may receive a survey requesting feedback about your experience at our office. We strive to receive "very good" responses to the survey questions. If you feel that your experience would prevent you from giving the office a "very good " response, please contact our office to try to remedy the situation. We may be reached at [phone]. Thank you for taking the time out of your busy day to complete the survey.

## 2023-01-27 ENCOUNTER — Ambulatory Visit (INDEPENDENT_AMBULATORY_CARE_PROVIDER_SITE_OTHER): Payer: Medicare HMO

## 2023-01-27 ENCOUNTER — Ambulatory Visit: Payer: Medicare HMO | Admitting: Sports Medicine

## 2023-01-27 VITALS — BP 150/78 | HR 60 | Ht 59.0 in | Wt 94.0 lb

## 2023-01-27 DIAGNOSIS — G8929 Other chronic pain: Secondary | ICD-10-CM | POA: Diagnosis not present

## 2023-01-27 DIAGNOSIS — M79601 Pain in right arm: Secondary | ICD-10-CM

## 2023-01-27 DIAGNOSIS — M25511 Pain in right shoulder: Secondary | ICD-10-CM | POA: Diagnosis not present

## 2023-01-27 NOTE — Patient Instructions (Addendum)
Shoulder HEP  3 week follow up

## 2023-01-28 DIAGNOSIS — R69 Illness, unspecified: Secondary | ICD-10-CM | POA: Diagnosis not present

## 2023-01-29 DIAGNOSIS — R69 Illness, unspecified: Secondary | ICD-10-CM | POA: Diagnosis not present

## 2023-02-17 NOTE — Progress Notes (Deleted)
    Aleen Sells D.[name] [address] [place] [address] Phone: [phone]   Assessment and Plan:     There are no diagnoses linked to this encounter.  ***   Pertinent previous records reviewed include ***   Follow Up: ***     Subjective:   I, Dai Apel, am serving as a Neurosurgeon for Doctor Richardean Sale   Chief Complaint: right arm pain    HPI:  01/27/2023 Patient is a 72 year old female complaining of right arm pain. Patients states that she feels like her bicep is permanently contracted , 4 months , she feels It the most in her back swing , pain radiates down the arm and up to the shoulder , no blood clot , no MOI, Pain worse by the end of the day. Described as an ache that at times becomes a jab. Pain can travel from mid upper arm to forearm. Feels like a muscle soreness at times. Has pain with movement of arm. Pt tried Advil Salonpas, Biofreeze. Patient is right-handed. Having to change the way she completes task at work due to the discomfort. Patient works at a [employer]. Hx of parkinson , constant pain , advil , salone pas, massage, and icing non of these have helped    02/24/2023 Patient states   Relevant Historical Information: Parkinson disease, osteoporosis  Additional pertinent review of systems negative.   Current Outpatient Medications:    aspirin 81 MG tablet, Take 81 mg by mouth daily., Disp: , Rfl:    carbidopa-levodopa (SINEMET IR) 25-100 MG tablet, TAKE 1 TABLET BY MOUTH THREE TIMES DAILY AT  7  AM  /  11  AM  /  4  PM, Disp: 270 tablet, Rfl: 0   gabapentin (NEURONTIN) 100 MG capsule, Take 2 capsules (200 mg total) by mouth at bedtime., Disp: 180 capsule, Rfl: 0   magnesium 30 MG tablet, Take 30 mg by mouth daily., Disp: , Rfl:    Omega-3 Fatty Acids (OMEGA-3 FISH OIL) 500 MG CAPS, Take 50 mg by mouth., Disp: , Rfl:    OVER THE COUNTER MEDICATION, Calcium with D3 500 mg, Disp: , Rfl:    Objective:      There were no vitals filed for this visit.    There is no height or weight on file to calculate BMI.    Physical Exam:    ***   Electronically signed by:  Aleen Sells [name] 11:21 AM 02/17/23

## 2023-02-19 DIAGNOSIS — R69 Illness, unspecified: Secondary | ICD-10-CM | POA: Diagnosis not present

## 2023-02-24 ENCOUNTER — Ambulatory Visit: Payer: Medicare HMO | Admitting: Sports Medicine

## 2023-03-24 ENCOUNTER — Other Ambulatory Visit: Payer: Self-pay | Admitting: Family Medicine

## 2023-03-24 DIAGNOSIS — Z1231 Encounter for screening mammogram for malignant neoplasm of breast: Secondary | ICD-10-CM

## 2023-03-31 ENCOUNTER — Ambulatory Visit
Admission: RE | Admit: 2023-03-31 | Discharge: 2023-03-31 | Disposition: A | Discharge status: Home / Self Care | Payer: Medicare HMO | Source: Ambulatory Visit | Attending: Family Medicine | Admitting: Family Medicine

## 2023-03-31 DIAGNOSIS — Z1231 Encounter for screening mammogram for malignant neoplasm of breast: Secondary | ICD-10-CM

## 2023-04-03 ENCOUNTER — Ambulatory Visit (INDEPENDENT_AMBULATORY_CARE_PROVIDER_SITE_OTHER): Payer: Medicare HMO | Admitting: Family Medicine

## 2023-04-03 ENCOUNTER — Encounter: Payer: Self-pay | Admitting: Family Medicine

## 2023-04-03 VITALS — BP 130/84 | HR 75 | Temp 98.6°F | Ht 59.0 in | Wt 90.6 lb

## 2023-04-03 DIAGNOSIS — Z0000 Encounter for general adult medical examination without abnormal findings: Secondary | ICD-10-CM

## 2023-04-03 DIAGNOSIS — M810 Age-related osteoporosis without current pathological fracture: Secondary | ICD-10-CM

## 2023-04-03 DIAGNOSIS — G20A1 Parkinson's disease without dyskinesia, without mention of fluctuations: Secondary | ICD-10-CM | POA: Diagnosis not present

## 2023-04-03 LAB — COMPREHENSIVE METABOLIC PANEL
ALT: 4 U/L (ref 0–35)
AST: 21 U/L (ref 0–37)
Albumin: 4.6 g/dL (ref 3.5–5.2)
Alkaline Phosphatase: 65 U/L (ref 39–117)
BUN: 16 mg/dL (ref 6–23)
CO2: 30 mEq/L (ref 19–32)
Calcium: 9.7 mg/dL (ref 8.4–10.5)
Chloride: 103 mEq/L (ref 96–112)
Creatinine, Ser: 1 mg/dL (ref 0.40–1.20)
GFR: 55.54 mL/min — ABNORMAL LOW (ref >60.00)
Glucose, Bld: 88 mg/dL (ref 70–99)
Potassium: 4.5 mEq/L (ref 3.5–5.1)
Sodium: 142 mEq/L (ref 135–145)
Total Bilirubin: 0.6 mg/dL (ref 0.2–1.2)
Total Protein: 7.4 g/dL (ref 6.0–8.3)

## 2023-04-03 LAB — CBC WITH DIFFERENTIAL/PLATELET
Basophils Absolute: 0 10*3/uL (ref 0.0–0.1)
Basophils Relative: 0.6 % (ref 0.0–3.0)
Eosinophils Absolute: 0.1 10*3/uL (ref 0.0–0.7)
Eosinophils Relative: 1.7 % (ref 0.0–5.0)
HCT: 43 % (ref 36.0–46.0)
Hemoglobin: 14.4 g/dL (ref 12.0–15.0)
Lymphocytes Relative: 16.3 % (ref 12.0–46.0)
Lymphs Abs: 1.1 10*3/uL (ref 0.7–4.0)
MCHC: 33.5 g/dL (ref 30.0–36.0)
MCV: 100.3 fl — ABNORMAL HIGH (ref 78.0–100.0)
Monocytes Absolute: 0.4 10*3/uL (ref 0.1–1.0)
Monocytes Relative: 5.8 % (ref 3.0–12.0)
Neutro Abs: 5.3 10*3/uL (ref 1.4–7.7)
Neutrophils Relative %: 75.6 % (ref 43.0–77.0)
Platelets: 251 10*3/uL (ref 150.0–400.0)
RBC: 4.29 Mil/uL (ref 3.87–5.11)
RDW: 13.5 % (ref 11.5–15.5)
WBC: 7 10*3/uL (ref 4.0–10.5)

## 2023-04-03 LAB — LIPID PANEL
Cholesterol: 308 mg/dL — ABNORMAL HIGH (ref 0–200)
HDL: 119.6 mg/dL (ref >39.00)
LDL Cholesterol: 163 mg/dL — ABNORMAL HIGH (ref 0–99)
NonHDL: 188.03
Total CHOL/HDL Ratio: 3
Triglycerides: 123 mg/dL (ref 0.0–149.0)
VLDL: 24.6 mg/dL (ref 0.0–40.0)

## 2023-04-03 LAB — HEMOGLOBIN A1C: Hgb A1c MFr Bld: 5.4 % (ref 4.6–6.5)

## 2023-04-03 LAB — TSH: TSH: 1.42 u[IU]/mL (ref 0.35–5.50)

## 2023-04-03 LAB — VITAMIN D 25 HYDROXY (VIT D DEFICIENCY, FRACTURES): VITD: 55.67 ng/mL (ref 30.00–100.00)

## 2023-04-03 NOTE — Progress Notes (Signed)
Established Patient Office Visit   Subjective  Patient ID: Jennifer Lutz, female    DOB: 12/17/1950  Age: 72 y.o. MRN: [id number]  Chief Complaint  Patient presents with   Annual Exam    Patient seen for CPE.  Patient states she been doing well overall.  Seen by neurology for PD which is stable on Sinemet IR.  Seen by med for left shoulder pain due to R rotator cuff injury.  Patient considering getting her part-time job as it is causing shoulder discomfort.  Still able to play golf despite the injury.  Patient continues to exercise regularly.    Past Medical History:  Diagnosis Date   Chronic kidney disease    reduced kidney function   Complication of anesthesia    'takes very little.' 'Hard to wake up'   Degenerative disc disease, lumbar    Hyperlipidemia    no meds- diet controlled   Medical history non-contributory    Neuromuscular disorder (HCC)    Parkinson's disease   Past Surgical History:  Procedure Laterality Date   COLONOSCOPY  2008   FASCIAL DEFECT REPAIR Left 1983   left thigh   NASAL SEPTUM SURGERY  09/23/1983   WRIST ARTHROSCOPY WITH ULNA SHORTENING Left 06/07/2015   Procedure: ARTHROSCOPY LEFT WRIST DEBRIDEMENT ULNAR SHORTENING;  Surgeon: Cindee Salt, MD;  Location: [facility];  Service: Orthopedics;  Laterality: Left;   WRIST OSTEOTOMY Left 06/07/2015   Procedure: LEFT OSTEOTOMY;  Surgeon: Cindee Salt, MD;  Location: [facility];  Service: Orthopedics;  Laterality: Left;   Social History   Tobacco Use   Smoking status: Never   Smokeless tobacco: Never  Vaping Use   Vaping status: Never Used  Substance Use Topics   Alcohol use: Yes    Comment: 2 glasses qd   Drug use: No   Family History  Problem Relation Age of Onset   Stroke Mother    Colon polyps Brother    Stroke Other    Colon cancer Neg Hx    Esophageal cancer Neg Hx    Stomach cancer Neg Hx    Rectal cancer Neg Hx    No Known Allergies     ROS Negative unless stated above    Objective:     BP 130/84 (BP Location: Left Arm, Patient Position: Sitting, Cuff Size: Normal)   Pulse 75   Temp 98.6 F (37 C) (Oral)   Ht 4\' 11"  (1.499 m)   Wt 90 lb 9.6 oz (41.1 kg)   SpO2 97%   BMI 18.30 kg/m    Physical Exam Constitutional:      Appearance: Normal appearance.  HENT:     Head: Normocephalic and atraumatic.     Right Ear: Tympanic membrane, ear canal and external ear normal.     Left Ear: Tympanic membrane, ear canal and external ear normal.     Nose: Nose normal.     Mouth/Throat:     Mouth: Mucous membranes are moist.     Pharynx: No oropharyngeal exudate or posterior oropharyngeal erythema.  Eyes:     General: No scleral icterus.    Extraocular Movements: Extraocular movements intact.     Conjunctiva/sclera: Conjunctivae normal.     Pupils: Pupils are equal, round, and reactive to light.  Neck:     Thyroid: No thyromegaly.  Cardiovascular:     Rate and Rhythm: Normal rate and regular rhythm.     Pulses: Normal pulses.     Heart  sounds: Normal heart sounds. No murmur heard.    No friction rub.  Pulmonary:     Effort: Pulmonary effort is normal.     Breath sounds: Normal breath sounds. No wheezing, rhonchi or rales.  Abdominal:     General: Bowel sounds are normal.     Palpations: Abdomen is soft.     Tenderness: There is no abdominal tenderness.  Musculoskeletal:        General: No deformity. Normal range of motion.  Lymphadenopathy:     Cervical: No cervical adenopathy.  Skin:    General: Skin is warm and dry.     Findings: No lesion.  Neurological:     General: No focal deficit present.     Mental Status: She is alert and oriented to person, place, and time.  Psychiatric:        Mood and Affect: Mood normal.        Thought Content: Thought content normal.      No results found for any visits on 04/03/23.    Assessment & Plan:  Well adult exam -     CBC with Differential/Platelet -      TSH -     Hemoglobin A1c -     Lipid panel -     Comprehensive metabolic panel  Parkinson's disease without dyskinesia or fluctuating manifestations  Age-related osteoporosis without current pathological fracture -     VITAMIN D 25 Hydroxy (Vit-D Deficiency, Fractures)  Age-appropriate health screenings discussed.  Immunizations reviewed.  Consider second dose Shingrix vaccine.  Mammogram up-to-date done 03/31/2023.  Colonoscopy up-to-date done 10/28/2021.  Continue follow-up with neurology for PD.  Continue Sinemet IR 25-100 mg 3 times daily.  Continue exercises for right rotator cuff injury.  No follow-ups on file.  Can schedule AWV.  Deeann Saint, MD

## 2023-04-06 ENCOUNTER — Ambulatory Visit: Payer: Medicare HMO

## 2023-04-06 VITALS — Ht 59.0 in | Wt 90.0 lb

## 2023-04-06 DIAGNOSIS — Z23 Encounter for immunization: Secondary | ICD-10-CM

## 2023-04-06 DIAGNOSIS — Z0000 Encounter for general adult medical examination without abnormal findings: Secondary | ICD-10-CM | POA: Diagnosis not present

## 2023-04-06 NOTE — Progress Notes (Signed)
Subjective:   Jennifer Lutz is a 72 y.o. female who presents for Medicare Annual (Subsequent) preventive examination.  Visit Complete: Virtual  I connected with  Jennifer Lutz on 04/06/23 by a audio enabled telemedicine application and verified that I am speaking with the correct person using two identifiers.  Patient Location: Home  Provider Location: Home Office  I discussed the limitations of evaluation and management by telemedicine. The patient expressed understanding and agreed to proceed.  Patient Medicare AWV questionnaire was completed by the patient on  ; I have confirmed that all information answered by patient is correct and no changes since this date.  Review of Systems      Cardiac Risk Factors include: advanced age (>64men, >19 women)     Objective:    Today's Vitals   04/06/23 0854  Weight: 90 lb (40.8 kg)  Height: 4\' 11"  (1.499 m)   Body mass index is 18.18 kg/m.     04/06/2023    9:01 AM 01/26/2023    8:08 AM 07/28/2022    8:08 AM 04/01/2022    8:58 AM 01/21/2022   10:31 AM 07/09/2021   11:05 AM 03/29/2021   10:34 AM  Advanced Directives  Does Patient Have a Medical Advance Directive? Yes Yes Yes Yes Yes Yes Yes  Type of Estate agent of [place];Living will Living will Living will Healthcare Power of [place];Living will  Living will Healthcare Power of [place];Living will  Does patient want to make changes to medical advance directive? No - Patient declined   No - Patient declined     Copy of Healthcare Power of Attorney in Chart? Yes - validated most recent copy scanned in chart (See row information)   Yes - validated most recent copy scanned in chart (See row information)   No - copy requested    Current Medications (verified) Outpatient Encounter Medications as of 04/06/2023  Medication Sig   aspirin 81 MG tablet Take 81 mg by mouth daily.   carbidopa-levodopa (SINEMET IR) 25-100 MG tablet TAKE 1 TABLET BY MOUTH  THREE TIMES DAILY AT  7  AM  /  11  AM  /  4  PM   gabapentin (NEURONTIN) 100 MG capsule Take 2 capsules (200 mg total) by mouth at bedtime. (Patient not taking: Reported on 04/03/2023)   magnesium 30 MG tablet Take 30 mg by mouth daily.   Omega-3 Fatty Acids (OMEGA-3 FISH OIL) 500 MG CAPS Take 50 mg by mouth.   OVER THE COUNTER MEDICATION Calcium with D3 500 mg   No facility-administered encounter medications on file as of 04/06/2023.    Allergies (verified) Patient has no known allergies.   History: Past Medical History:  Diagnosis Date   Chronic kidney disease    reduced kidney function   Complication of anesthesia    'takes very little.' 'Hard to wake up'   Degenerative disc disease, lumbar    Hyperlipidemia    no meds- diet controlled   Medical history non-contributory    Neuromuscular disorder (HCC)    Parkinson's disease   Past Surgical History:  Procedure Laterality Date   COLONOSCOPY  2008   FASCIAL DEFECT REPAIR Left 1983   left thigh   NASAL SEPTUM SURGERY  09/23/1983   WRIST ARTHROSCOPY WITH ULNA SHORTENING Left 06/07/2015   Procedure: ARTHROSCOPY LEFT WRIST DEBRIDEMENT ULNAR SHORTENING;  Surgeon: Cindee Salt, MD;  Location: [facility];  Service: Orthopedics;  Laterality: Left;   WRIST OSTEOTOMY Left  06/07/2015   Procedure: LEFT OSTEOTOMY;  Surgeon: Cindee Salt, MD;  Location: [facility];  Service: Orthopedics;  Laterality: Left;   Family History  Problem Relation Age of Onset   Stroke Mother    Colon polyps Brother    Stroke Other    Colon cancer Neg Hx    Esophageal cancer Neg Hx    Stomach cancer Neg Hx    Rectal cancer Neg Hx    Social History   Socioeconomic History   Marital status: Divorced    Spouse name: Not on file   Number of children: Not on file   Years of education: Not on file   Highest education level: Not on file  Occupational History   Not on file  Tobacco Use   Smoking status: Never   Smokeless  tobacco: Never  Vaping Use   Vaping status: Never Used  Substance and Sexual Activity   Alcohol use: Yes    Comment: 2 glasses qd   Drug use: No   Sexual activity: Not on file  Other Topics Concern   Not on file  Social History Narrative   Not on file   Social Determinants of Health   Financial Resource Strain: Low Risk  (04/06/2023)   Overall Financial Resource Strain (CARDIA)    Difficulty of Paying Living Expenses: Not hard at all  Food Insecurity: No Food Insecurity (04/06/2023)   Hunger Vital Sign    Worried About Running Out of Food in the Last Year: Never true    Ran Out of Food in the Last Year: Never true  Transportation Needs: No Transportation Needs (04/06/2023)   PRAPARE - Administrator, Civil Service (Medical): No    Lack of Transportation (Non-Medical): No  Physical Activity: Sufficiently Active (04/06/2023)   Exercise Vital Sign    Days of Exercise per Week: 4 days    Minutes of Exercise per Session: 60 min  Stress: No Stress Concern Present (04/06/2023)   [employer] of Occupational Health - Occupational Stress Questionnaire    Feeling of Stress : Not at all  Social Connections: Moderately Integrated (04/06/2023)   Social Connection and Isolation Panel [NHANES]    Frequency of Communication with Friends and Family: More than three times a week    Frequency of Social Gatherings with Friends and Family: More than three times a week    Attends Religious Services: More than 4 times per year    Active Member of [employer] or Organizations: Yes    Attends Engineer, structural: More than 4 times per year    Marital Status: Divorced    Tobacco Counseling Counseling given: Not Answered   Clinical Intake:  Pre-visit preparation completed: No  Pain : No/denies pain     BMI - recorded: 18.18 Nutritional Status: BMI <19  Underweight Nutritional Risks: None Diabetes: No  How often do you need to have someone help you when you read  instructions, pamphlets, or other written materials from your doctor or pharmacy?: 1 - Never  Interpreter Needed?: No  Information entered by :: Jennifer Mulligan LPN   Activities of Daily Living    04/06/2023    9:00 AM  In your present state of health, do you have any difficulty performing the following activities:  Hearing? 0  Vision? 0  Difficulty concentrating or making decisions? 0  Walking or climbing stairs? 0  Dressing or bathing? 0  Doing errands, shopping? 0  Preparing Food and eating ? N  Using the Toilet? N  In the past six months, have you accidently leaked urine? N  Do you have problems with loss of bowel control? N  Managing your Medications? N  Managing your Finances? N  Housekeeping or managing your Housekeeping? N    Patient Care Team: Deeann Saint, MD as PCP - General (Family Medicine) Tat, Octaviano Batty, DO as Consulting Physician (Neurology) Janalyn Harder, MD (Inactive) as Consulting Physician (Dermatology)  Indicate any recent Medical Services you may have received from other than [facility] providers in the past year (date may be approximate).     Assessment:   This is a routine wellness examination for Jennifer Lutz.  Hearing/Vision screen Hearing Screening - Comments:: Denies hearing difficulties   Vision Screening - Comments:: Wears rx glasses - up to date with routine eye exams with  Dr Ronney Asters  Dietary issues and exercise activities discussed:     Goals Addressed               This Visit's Progress     Exercise 150 min/wk Moderate Activity (pt-stated)        Play more golf!       Depression Screen    04/03/2023    8:11 AM 01/19/2023    9:07 AM 04/01/2022    8:54 AM 02/20/2022    8:12 AM 03/29/2021   10:35 AM 10/31/2020    9:03 AM 09/01/2019    8:01 AM  PHQ 2/9 Scores  PHQ - 2 Score 0 0 0 0 0 0 0  PHQ- 9 Score 0 0 0 0       Fall Risk    04/06/2023    9:00 AM 04/03/2023    8:11 AM 01/26/2023    8:08 AM 01/19/2023    9:03 AM 07/28/2022     8:08 AM  Fall Risk   Falls in the past year? 1 1 1  0 0  Number falls in past yr: 0 0 0 0 0  Injury with Fall? 0 0 0 0 0  Risk for fall due to : No Fall Risks Other (Comment)  No Fall Risks   Follow up Falls prevention discussed Falls evaluation completed Falls evaluation completed Falls evaluation completed     MEDICARE RISK AT HOME:  Medicare Risk at Home - 04/06/23 0909     Any stairs in or around the home? No    If so, are there any without handrails? No    Home free of loose throw rugs in walkways, pet beds, electrical cords, etc? Yes    Adequate lighting in your home to reduce risk of falls? Yes    Life alert? No    Use of a cane, walker or w/c? No    Grab bars in the bathroom? Yes    Shower chair or bench in shower? No    Elevated toilet seat or a handicapped toilet? Yes             TIMED UP AND GO:  Was the test performed?  No    Cognitive Function:    04/20/2018    1:47 PM 10/03/2016   11:47 AM  MMSE - Mini Mental State Exam  Not completed: -- --        04/06/2023    9:01 AM 04/01/2022    8:59 AM  6CIT Screen  What Year? 0 points 0 points  What month? 0 points 0 points  What time? 0 points 0 points  Count back from 20  0 points 0 points  Months in reverse 0 points 0 points  Repeat phrase 0 points 0 points  Total Score 0 points 0 points    Immunizations Immunization History  Administered Date(s) Administered   Fluad Quad(high Dose 65+) 09/01/2019, 10/31/2020   Influenza Whole 07/08/2007, 10/16/2009   Influenza, High Dose Seasonal PF 07/29/2016, 09/07/2017   Moderna Sars-Covid-2 Vaccination 02/02/2020, 03/08/2020, 09/19/2020   Pneumococcal Conjugate-13 01/30/2015   Pneumococcal Polysaccharide-23 12/26/2013   Td 09/22/1997, 09/29/2008   Tetanus 12/26/2013   Zoster Recombinant(Shingrix) 10/15/2019   Zoster, Live 03/16/2015    TDAP status: Up to date  Flu Vaccine status: Up to date  Pneumococcal vaccine status: Up to date  Covid-19 vaccine  status: Completed vaccines  Qualifies for Shingles Vaccine? Yes   Zostavax completed Yes   Shingrix Completed?: Yes  Screening Tests Health Maintenance  Topic Date Due   Zoster Vaccines- Shingrix (2 of 2) 12/10/2019   COVID-19 Vaccine (4 - 2023-24 season) 04/22/2023 (Originally 05/23/2022)   INFLUENZA VACCINE  04/23/2023   DTaP/Tdap/Td (4 - Tdap) 12/27/2023   MAMMOGRAM  03/30/2024   Medicare Annual Wellness (AWV)  04/05/2024   Colonoscopy  10/29/2031   Pneumonia Vaccine 56+ Years old  Completed   DEXA SCAN  Completed   Hepatitis C Screening  Completed   HPV VACCINES  Aged Out    Health Maintenance  Health Maintenance Due  Topic Date Due   Zoster Vaccines- Shingrix (2 of 2) 12/10/2019    Colorectal cancer screening: Type of screening: Colonoscopy. Completed 10/28/21. Repeat every 10 years  Mammogram status: Completed 03/31/23. Repeat every year  Bone Density status: Completed 10/11/21. Results reflect: Bone density results: OSTEOPOROSIS. Repeat every   years.  Lung Cancer Screening: (Low Dose CT Chest recommended if Age 55-80 years, 20 pack-year currently smoking OR have quit w/in 15years.) does not qualify.    Additional Screening:  Hepatitis C Screening: does qualify; Completed 04/12/21  Vision Screening: Recommended annual ophthalmology exams for early detection of glaucoma and other disorders of the eye. Is the patient up to date with their annual eye exam?  Yes  Who is the provider or what is the name of the office in which the patient attends annual eye exams? Dr Ronney Asters If pt is not established with a provider, would they like to be referred to a provider to establish care? No .   Dental Screening: Recommended annual dental exams for proper oral hygiene    Community Resource Referral / Chronic Care Management:  CRR required this visit?  No   CCM required this visit?  No     Plan:     I have personally reviewed and noted the following in the patient's  chart:   Medical and social history Use of alcohol, tobacco or illicit drugs  Current medications and supplements including opioid prescriptions. Patient is not currently taking opioid prescriptions. Functional ability and status Nutritional status Physical activity Advanced directives List of other physicians Hospitalizations, surgeries, and ER visits in previous 12 months Vitals Screenings to include cognitive, depression, and falls Referrals and appointments  In addition, I have reviewed and discussed with patient certain preventive protocols, quality metrics, and best practice recommendations. A written personalized care plan for preventive services as well as general preventive health recommendations were provided to patient.     [name], LPN   03/21/5283   After Visit Summary: (MyChart) Due to this being a telephonic visit, the after visit summary with patients personalized plan was  offered to patient via MyChart   Nurse Notes: None

## 2023-04-06 NOTE — Patient Instructions (Addendum)
Jennifer Lutz , Thank you for taking time to come for your Medicare Wellness Visit. I appreciate your ongoing commitment to your health goals. Please review the following plan we discussed and let me know if I can assist you in the future.   These are the goals we discussed:  Goals       Exercise 150 min/wk Moderate Activity (pt-stated)      Play more golf!      Stay healthy and active (pt-stated)      Weight (lb) < 102 lb (46.3 kg)      Will try to eat more vegetables Can add frozen smoothies to meats etc         This is a list of the screening recommended for you and due dates:  Health Maintenance  Topic Date Due   Zoster (Shingles) Vaccine (2 of 2) 12/10/2019   COVID-19 Vaccine (4 - 2023-24 season) 04/22/2023*   Flu Shot  04/23/2023   DTaP/Tdap/Td vaccine (4 - Tdap) 12/27/2023   Mammogram  03/30/2024   Medicare Annual Wellness Visit  04/05/2024   Colon Cancer Screening  10/29/2031   Pneumonia Vaccine  Completed   DEXA scan (bone density measurement)  Completed   Hepatitis C Screening  Completed   HPV Vaccine  Aged Out  *Topic was postponed. The date shown is not the original due date.    Advanced directives: In chart  Conditions/risks identified: None  Next appointment: Follow up in one year for your annual wellness visit     Preventive Care 65 Years and Older, Female Preventive care refers to lifestyle choices and visits with your health care provider that can promote health and wellness. What does preventive care include? A yearly physical exam. This is also called an annual well check. Dental exams once or twice a year. Routine eye exams. Ask your health care provider how often you should have your eyes checked. Personal lifestyle choices, including: Daily care of your teeth and gums. Regular physical activity. Eating a healthy diet. Avoiding tobacco and drug use. Limiting alcohol use. Practicing safe sex. Taking low-dose aspirin every day. Taking vitamin  and mineral supplements as recommended by your health care provider. What happens during an annual well check? The services and screenings done by your health care provider during your annual well check will depend on your age, overall health, lifestyle risk factors, and family history of disease. Counseling  Your health care provider may ask you questions about your: Alcohol use. Tobacco use. Drug use. Emotional well-being. Home and relationship well-being. Sexual activity. Eating habits. History of falls. Memory and ability to understand (cognition). Work and work Astronomer. Reproductive health. Screening  You may have the following tests or measurements: Height, weight, and BMI. Blood pressure. Lipid and cholesterol levels. These may be checked every 5 years, or more frequently if you are over 22 years old. Skin check. Lung cancer screening. You may have this screening every year starting at age 10 if you have a 30-pack-year history of smoking and currently smoke or have quit within the past 15 years. Fecal occult blood test (FOBT) of the stool. You may have this test every year starting at age 38. Flexible sigmoidoscopy or colonoscopy. You may have a sigmoidoscopy every 5 years or a colonoscopy every 10 years starting at age 45. Hepatitis C blood test. Hepatitis B blood test. Sexually transmitted disease (STD) testing. Diabetes screening. This is done by checking your blood sugar (glucose) after you have not eaten for a  while (fasting). You may have this done every 1-3 years. Bone density scan. This is done to screen for osteoporosis. You may have this done starting at age 25. Mammogram. This may be done every 1-2 years. Talk to your health care provider about how often you should have regular mammograms. Talk with your health care provider about your test results, treatment options, and if necessary, the need for more tests. Vaccines  Your health care provider may recommend  certain vaccines, such as: Influenza vaccine. This is recommended every year. Tetanus, diphtheria, and acellular pertussis (Tdap, Td) vaccine. You may need a Td booster every 10 years. Zoster vaccine. You may need this after age 98. Pneumococcal 13-valent conjugate (PCV13) vaccine. One dose is recommended after age 44. Pneumococcal polysaccharide (PPSV23) vaccine. One dose is recommended after age 52. Talk to your health care provider about which screenings and vaccines you need and how often you need them. This information is not intended to replace advice given to you by your health care provider. Make sure you discuss any questions you have with your health care provider. Document Released: 10/05/2015 Document Revised: 05/28/2016 Document Reviewed: 07/10/2015 Elsevier Interactive Patient Education  2017 [employer].  Fall Prevention in the Home Falls can cause injuries. They can happen to people of all ages. There are many things you can do to make your home safe and to help prevent falls. What can I do on the outside of my home? Regularly fix the edges of walkways and driveways and fix any cracks. Remove anything that might make you trip as you walk through a door, such as a raised step or threshold. Trim any bushes or trees on the path to your home. Use bright outdoor lighting. Clear any walking paths of anything that might make someone trip, such as rocks or tools. Regularly check to see if handrails are loose or broken. Make sure that both sides of any steps have handrails. Any raised decks and porches should have guardrails on the edges. Have any leaves, snow, or ice cleared regularly. Use sand or salt on walking paths during winter. Clean up any spills in your garage right away. This includes oil or grease spills. What can I do in the bathroom? Use night lights. Install grab bars by the toilet and in the tub and shower. Do not use towel bars as grab bars. Use non-skid mats or  decals in the tub or shower. If you need to sit down in the shower, use a plastic, non-slip stool. Keep the floor dry. Clean up any water that spills on the floor as soon as it happens. Remove soap buildup in the tub or shower regularly. Attach bath mats securely with double-sided non-slip rug tape. Do not have throw rugs and other things on the floor that can make you trip. What can I do in the bedroom? Use night lights. Make sure that you have a light by your bed that is easy to reach. Do not use any sheets or blankets that are too big for your bed. They should not hang down onto the floor. Have a firm chair that has side arms. You can use this for support while you get dressed. Do not have throw rugs and other things on the floor that can make you trip. What can I do in the kitchen? Clean up any spills right away. Avoid walking on wet floors. Keep items that you use a lot in easy-to-reach places. If you need to reach something above you,  use a strong step stool that has a grab bar. Keep electrical cords out of the way. Do not use floor polish or wax that makes floors slippery. If you must use wax, use non-skid floor wax. Do not have throw rugs and other things on the floor that can make you trip. What can I do with my stairs? Do not leave any items on the stairs. Make sure that there are handrails on both sides of the stairs and use them. Fix handrails that are broken or loose. Make sure that handrails are as long as the stairways. Check any carpeting to make sure that it is firmly attached to the stairs. Fix any carpet that is loose or worn. Avoid having throw rugs at the top or bottom of the stairs. If you do have throw rugs, attach them to the floor with carpet tape. Make sure that you have a light switch at the top of the stairs and the bottom of the stairs. If you do not have them, ask someone to add them for you. What else can I do to help prevent falls? Wear shoes that: Do not  have high heels. Have rubber bottoms. Are comfortable and fit you well. Are closed at the toe. Do not wear sandals. If you use a stepladder: Make sure that it is fully opened. Do not climb a closed stepladder. Make sure that both sides of the stepladder are locked into place. Ask someone to hold it for you, if possible. Clearly mark and make sure that you can see: Any grab bars or handrails. First and last steps. Where the edge of each step is. Use tools that help you move around (mobility aids) if they are needed. These include: Canes. Walkers. Scooters. Crutches. Turn on the lights when you go into a dark area. Replace any light bulbs as soon as they burn out. Set up your furniture so you have a clear path. Avoid moving your furniture around. If any of your floors are uneven, fix them. If there are any pets around you, be aware of where they are. Review your medicines with your doctor. Some medicines can make you feel dizzy. This can increase your chance of falling. Ask your doctor what other things that you can do to help prevent falls. This information is not intended to replace advice given to you by your health care provider. Make sure you discuss any questions you have with your health care provider. Document Released: 07/05/2009 Document Revised: 02/14/2016 Document Reviewed: 10/13/2014 Elsevier Interactive Patient Education  2017 [employer].

## 2023-04-07 ENCOUNTER — Encounter: Payer: Self-pay | Admitting: Family Medicine

## 2023-04-13 ENCOUNTER — Other Ambulatory Visit: Payer: Self-pay | Admitting: Neurology

## 2023-04-13 DIAGNOSIS — G20A1 Parkinson's disease without dyskinesia, without mention of fluctuations: Secondary | ICD-10-CM

## 2023-04-14 ENCOUNTER — Encounter: Payer: Self-pay | Admitting: Family Medicine

## 2023-04-15 ENCOUNTER — Other Ambulatory Visit: Payer: Self-pay | Admitting: Family Medicine

## 2023-04-15 ENCOUNTER — Other Ambulatory Visit: Payer: Self-pay | Admitting: Neurology

## 2023-04-15 DIAGNOSIS — E782 Mixed hyperlipidemia: Secondary | ICD-10-CM

## 2023-04-15 DIAGNOSIS — G20A1 Parkinson's disease without dyskinesia, without mention of fluctuations: Secondary | ICD-10-CM

## 2023-04-15 MED ORDER — ROSUVASTATIN CALCIUM 5 MG PO TABS: ORAL_TABLET | ORAL | 3 refills | Status: DC

## 2023-04-15 NOTE — Addendum Note (Signed)
Addended by: Abbe Amsterdam R on: 04/15/2023 09:01 PM   Modules accepted: Orders

## 2023-04-15 NOTE — Telephone Encounter (Signed)
Pt called to add that she is ready to start Cholesterol medication, ... and would like to know if MD will send a Rx to: [facility] [address] [place] - [address] Phone: [phone]  Fax: [phone]     Please advise.

## 2023-05-07 ENCOUNTER — Encounter: Payer: Self-pay | Admitting: Neurology

## 2023-05-12 ENCOUNTER — Ambulatory Visit: Payer: Medicare HMO | Admitting: Sports Medicine

## 2023-05-12 VITALS — BP 138/60 | HR 72 | Ht 59.0 in | Wt 91.0 lb

## 2023-05-12 DIAGNOSIS — M25511 Pain in right shoulder: Secondary | ICD-10-CM

## 2023-05-12 DIAGNOSIS — G8929 Other chronic pain: Secondary | ICD-10-CM

## 2023-05-12 DIAGNOSIS — M79601 Pain in right arm: Secondary | ICD-10-CM | POA: Diagnosis not present

## 2023-05-12 NOTE — Patient Instructions (Addendum)
Good to see you  Injection given today Continue HEP Decrease sets and reps to a point that is not painful the next day Follow up as needed

## 2023-05-12 NOTE — Progress Notes (Signed)
Jennifer Lutz D.[facility] [address] [place] [address] Phone: [phone]   Assessment and Plan:     1. Right arm pain 2. Chronic right shoulder pain  -Chronic with exacerbation, subsequent visit - Still most consistent with rotator cuff pathology causing irritation at and around greater tuberosity insertion.  Likely biceps tendinitis contributing at today's visit - Patient received significant relief with subacromial CSI at previous office visit on 01/27/2023 with nearly 3 months relief.  Patient elected for repeat CSI today.  Tolerated well per note below. - Continue Tylenol 500 to 1000 mg tablets 2-3 times a day as needed for day-to-day pain relief - Continue HEP.  May decrease sets and reps to decrease next-day soreness -Could consider short course of NSAIDs versus prednisone, however decided to proceed with subacromial CSI due to benefit at previous office visit  Procedure: Subacromial Injection Side: Right  Risks explained and consent was given verbally. The site was cleaned with alcohol prep. A steroid injection was performed from posterior approach using 2mL of 1% lidocaine without epinephrine and 1mL of kenalog 40mg /ml. This was well tolerated and resulted in symptomatic relief.  Needle was removed, hemostasis achieved, and post injection instructions were explained.   Pt was advised to call or return to clinic if these symptoms worsen or fail to improve as anticipated.   Pertinent previous records reviewed include none   Follow Up: As needed if no improvement or worsening of symptoms.  Could consider physical therapy versus NSAIDs versus prednisone   Subjective:    Chief Complaint: right arm and right shoulder pain   HPI:   01/27/2023 Patient is a 72 year old female complaining of right arm pain. Patients states that she feels like her bicep is permanently contracted , 4 months , she feels It the most in her back swing , pain  radiates down the arm and up to the shoulder , no blood clot , no MOI, Pain worse by the end of the day. Described as an ache that at times becomes a jab. Pain can travel from mid upper arm to forearm. Feels like a muscle soreness at times. Has pain with movement of arm. Pt tried Advil Salonpas, Biofreeze. Patient is right-handed. Having to change the way she completes task at work due to the discomfort. Patient works at a [employer] course. Hx of parkinson , constant pain , advil , salone pas, massage, and icing non of these have helped     05/12/23 Patient states that she had a follow up appointment and sche cancelled the day before because she was doing so well. The next day the pain was back. Patient noticed that after her exercises she noticed that she is very sore the next day. Patient is not sure if there is some movement she is doing that is re hurting it again.. the pain is located in the right bicep area. Patient did stop working and thinks that will help with the pain.   Injection and HEP  Relevant Historical Information: Parkinson disease, osteoporosis   Additional pertinent review of systems negative.   Current Outpatient Medications:    aspirin 81 MG tablet, Take 81 mg by mouth daily., Disp: , Rfl:    carbidopa-levodopa (SINEMET IR) 25-100 MG tablet, TAKE 1 TABLET BY MOUTH THREE TIMES DAILY AT  7  AM  /  11  AM  /  4  PM., Disp: 270 tablet, Rfl: 0   magnesium 30 MG  tablet, Take 30 mg by mouth daily., Disp: , Rfl:    Omega-3 Fatty Acids (OMEGA-3 FISH OIL) 500 MG CAPS, Take 50 mg by mouth., Disp: , Rfl:    OVER THE COUNTER MEDICATION, Calcium with D3 500 mg, Disp: , Rfl:    rosuvastatin (CRESTOR) 5 MG tablet, Take 1 tab (5 mg) 3 times per week., Disp: 30 tablet, Rfl: 3   gabapentin (NEURONTIN) 100 MG capsule, Take 2 capsules (200 mg total) by mouth at bedtime. (Patient not taking: Reported on 04/03/2023), Disp: 180 capsule, Rfl: 0   Objective:     Vitals:   05/12/23 0846  BP:  138/60  Pulse: 72  SpO2: 98%  Weight: 91 lb (41.3 kg)  Height: 4\' 11"  (1.499 m)      Body mass index is 18.38 kg/m.    Physical Exam:    Gen: Appears well, nad, nontoxic and pleasant Neuro:sensation intact, strength is 5/5 with df/pf/inv/ev, muscle tone wnl Skin: no suspicious lesion or defmority Psych: A&O, appropriate mood and affect  Right shoulder:  No deformity, swelling or muscle wasting No scapular winging FF 180, abd 180, int 10, ext 90 TTP deltoid, biceps groove NTTP over the Guttenberg, clavicle, ac, coracoid,   trapezius, cervical spine Positive Neer, Hawkins, empty can, speeds Neg    subscap liftoff,   Neg ant drawer, sulcus sign, apprehension Negative Spurling's test bilat FROM of neck    Electronically signed by:  Jennifer Lutz D.[facility] 9:11 AM 05/12/23

## 2023-06-17 ENCOUNTER — Encounter: Payer: Self-pay | Admitting: Family Medicine

## 2023-07-28 DIAGNOSIS — H5213 Myopia, bilateral: Secondary | ICD-10-CM | POA: Diagnosis not present

## 2023-07-28 NOTE — Progress Notes (Signed)
Assessment/Plan:   1.  Parkinsons Disease  -Continue carbidopa/levodopa 25/100, 1 tablet 3 times per day.  She looks great today.  -She is following with Dr. June Leap  -she is doing great with her Parkinsons Disease exercises and I am proud of her!  -We discussed that it used to be thought that levodopa would increase risk of melanoma but now it is believed that Parkinsons itself likely increases risk of melanoma. she is to get regular skin checks.  She has a f/u with derm in Jan  2.  Hx of Sciatica/low back pain  -Following with Dr. Katrinka Blazing and Dr. Jean Rosenthal.  Treatment with [facility] in past   3.  Chronic right shoulder pain due to rotator cuff pathology  -Following with sports medicine, Dr. Jean Rosenthal  -Has found injections beneficial Subjective:   Jennifer Lutz was seen today in follow up for Parkinsons disease.  My previous records were reviewed prior to todays visit as well as outside records available to me.  Patient is taking her Parkinson's medicine faithfully.  She has had no falls.  She did start seeing sports medicine not long after I saw her last visit for shoulder pain felt due to rotator cuff pathology.  She was given an injection in May and had a repeat injection in August.  This helped a lot.  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  No hallucinations.  Mild drooling at night.  One episode of lightheadedness x 15-20 seconds.    Current prescribed movement disorder medications: Carbidopa/levodopa 25/100, 1 tablet 3 times per day   ALLERGIES:  No Known Allergies  CURRENT MEDICATIONS:  Outpatient Encounter Medications as of 08/03/2023  Medication Sig   aspirin 81 MG tablet Take 81 mg by mouth daily.   carbidopa-levodopa (SINEMET IR) 25-100 MG tablet TAKE 1 TABLET BY MOUTH THREE TIMES DAILY AT  7  AM  /  11  AM  /  4  PM.   magnesium 30 MG tablet Take 30 mg by mouth daily.   Omega-3 Fatty Acids (OMEGA-3 FISH OIL) 500 MG CAPS Take 50  mg by mouth.   OVER THE COUNTER MEDICATION Calcium with D3 500 mg   rosuvastatin (CRESTOR) 5 MG tablet Take 1 tab (5 mg) 3 times per week.   No facility-administered encounter medications on file as of 08/03/2023.    Objective:   PHYSICAL EXAMINATION:    VITALS:   Vitals:   08/03/23 0829  BP: 126/66  Pulse: 80  SpO2: 98%  Weight: 94 lb 9.6 oz (42.9 kg)  Height: 4\' 11"  (1.499 m)      GEN:  The patient appears stated age and is in NAD.   HEENT:  Normocephalic, atraumatic.  The mucous membranes are moist.  CV:  RRR Lungs:  CTAB  Neurological examination:  Orientation: The patient is alert and oriented x3. Cranial nerves: There is good facial symmetry with facial hypomimia. The speech is fluent and clear. Soft palate rises symmetrically and there is no tongue deviation. Hearing is intact to conversational tone. Sensation: Sensation is intact to light touch throughout Motor: Strength is at least antigravity x4.  Movement examination: Tone: There is min increased tone in the RUE Abnormal movements: there is no tremor today Coordination:  There is no decremation, with any form of RAMS, including alternating supination and pronation of the forearm, hand opening and closing, finger taps, heel taps and toe taps. Gait and Station: The  patient ambulates well in the hall with good arm swing.    I have reviewed and interpreted the following labs independently    Chemistry      Component Value Date/Time   NA 142 04/03/2023 0857   K 4.5 04/03/2023 0857   CL 103 04/03/2023 0857   CO2 30 04/03/2023 0857   BUN 16 04/03/2023 0857   CREATININE 1.00 04/03/2023 0857      Component Value Date/Time   CALCIUM 9.7 04/03/2023 0857   ALKPHOS 65 04/03/2023 0857   AST 21 04/03/2023 0857   ALT 4 04/03/2023 0857   BILITOT 0.6 04/03/2023 0857       Lab Results  Component Value Date   WBC 7.0 04/03/2023   HGB 14.4 04/03/2023   HCT 43.0 04/03/2023   MCV 100.3 (H) 04/03/2023   PLT  251.0 04/03/2023    Lab Results  Component Value Date   TSH 1.42 04/03/2023   Total time spent on today's visit was 20 minutes, including both face-to-face time and nonface-to-face time.  Time included that spent on review of records (prior notes available to me/labs/imaging if pertinent), discussing treatment and goals, answering patient's questions and coordinating care.    Cc:  Deeann Saint, MD

## 2023-07-30 ENCOUNTER — Ambulatory Visit: Payer: Medicare HMO | Admitting: Neurology

## 2023-08-03 ENCOUNTER — Ambulatory Visit: Payer: Medicare HMO | Admitting: Neurology

## 2023-08-03 ENCOUNTER — Encounter: Payer: Self-pay | Admitting: Neurology

## 2023-08-03 VITALS — BP 126/66 | HR 80 | Ht 59.0 in | Wt 94.6 lb

## 2023-08-03 DIAGNOSIS — G20A1 Parkinson's disease without dyskinesia, without mention of fluctuations: Secondary | ICD-10-CM | POA: Diagnosis not present

## 2023-08-03 NOTE — Patient Instructions (Signed)

## 2023-08-04 ENCOUNTER — Ambulatory Visit: Payer: Medicare HMO | Admitting: Sports Medicine

## 2023-08-04 VITALS — BP 120/78 | HR 76 | Ht 59.0 in | Wt 94.0 lb

## 2023-08-04 DIAGNOSIS — G5701 Lesion of sciatic nerve, right lower limb: Secondary | ICD-10-CM

## 2023-08-04 DIAGNOSIS — M25551 Pain in right hip: Secondary | ICD-10-CM | POA: Diagnosis not present

## 2023-08-04 MED ORDER — MELOXICAM 15 MG PO TABS: 15.0000 mg | ORAL_TABLET | Freq: Every day | ORAL | 0 refills | Status: DC

## 2023-08-04 NOTE — Progress Notes (Signed)
Jennifer Lutz D.[facility] [address] [place] [address] Phone: [phone]   Assessment and Plan:     1. Right hip pain 2. Piriformis syndrome of right side -Chronic with exacerbation, subsequent sports medicine visit - Recurrence of right posterior hip pain with radicular symptoms down right thigh, but not passing right knee.  Most consistent with piriformis syndrome and gluteal muscle irritation of sciatic nerve based on HPI and physical exam.  Patient does have underlying moderate osteoarthritis of right hip, however negative logroll, FADIR on physical exam and all pain is posterior - Start meloxicam 15 mg daily x2 weeks.   Do not to use additional NSAIDs while taking meloxicam.  May use Tylenol [phone] mg 2 to 3 times a day for breakthrough pain.  -Start HEP for gluteal muscles and piriformis  15 additional minutes spent for educating Therapeutic Home Exercise Program.  This included exercises focusing on stretching, strengthening, with focus on eccentric aspects.   Long term goals include an improvement in range of motion, strength, endurance as well as avoiding reinjury. Patient's frequency would include in 1-2 times a day, 3-5 times a week for a duration of 6-12 weeks. Proper technique shown and discussed handout in great detail with ATC.  All questions were discussed and answered.    Pertinent previous records reviewed include hip and lumbar x-rays 09/03/2022  Follow Up: 3 weeks for reevaluation.  If no improvement or worsening of symptoms, could consider piriformis versus intra-articular hip CSI versus physical therapy   Subjective:   I, Jennifer Lutz, am serving as a Neurosurgeon for Doctor Jennifer Lutz  Chief Complaint: low back pain   HPI:  09/03/2022 Jennifer Lutz is a 72 y.o. female coming in with complaint of R hip pain. Last seen in September 2022 for OMT. Patient has been seeing Jennifer Lutz. Last week her pain  increased. Radiates down the leg to the knee. 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 Believes this motion may have exacerbated her pain.    08/04/23 Patient is a 72 year old female with concerns of low back pain. Patient states that she is in piriformis flare. She had to take 2 advil   Relevant Historical Information: Parkinson disease, osteoporosis  Additional pertinent review of systems negative.   Current Outpatient Medications:    aspirin 81 MG tablet, Take 81 mg by mouth daily., Disp: , Rfl:    carbidopa-levodopa (SINEMET IR) 25-100 MG tablet, TAKE 1 TABLET BY MOUTH THREE TIMES DAILY AT  7  AM  /  11  AM  /  4  PM., Disp: 270 tablet, Rfl: 0   magnesium 30 MG tablet, Take 30 mg by mouth daily., Disp: , Rfl:    meloxicam (MOBIC) 15 MG tablet, Take 1 tablet (15 mg total) by mouth daily., Disp: 14 tablet, Rfl: 0   Omega-3 Fatty Acids (OMEGA-3 FISH OIL) 500 MG CAPS, Take 50 mg by mouth., Disp: , Rfl:    OVER THE COUNTER MEDICATION, Calcium with D3 500 mg, Disp: , Rfl:    rosuvastatin (CRESTOR) 5 MG tablet, Take 1 tab (5 mg) 3 times per week., Disp: 30 tablet, Rfl: 3   Objective:     Vitals:   08/04/23 0905  BP: 120/78  Pulse: 76  SpO2: 100%  Weight: 94 lb (42.6 kg)  Height: 4\' 11"  (1.499 m)  Body mass index is 18.99 kg/m.    Physical Exam:    General: awake, alert, and oriented no acute distress, nontoxic Skin: no suspicious lesions or rashes Neuro:sensation intact distally with no deficits, normal muscle tone, no atrophy, strength 5/5 in all tested lower ext groups Psych: normal mood and affect, speech clear   Right hip: No deformity, swelling or wasting ROM Flexion 90, ext 30, IR 45, ER 45 TTP gluteal musculature NTTP over the hip flexors, greater trochanter,  si joint, lumbar spine Negative log roll with FROM Positive FABER for posterior hip pain Negative FADIR Negative  Piriformis test for radicular symptoms, though reproduced mild pain and tension Positive right trendelenberg Gait normal  No pain with lumbar extension  Electronically signed by:  Jennifer Lutz D.[facility] 9:29 AM 08/04/23

## 2023-08-04 NOTE — Patient Instructions (Signed)
-   Start meloxicam 15 mg daily x2 weeks. May use remaining meloxicam as needed once daily for pain control.  Do not to use additional NSAIDs while taking meloxicam.  May use Tylenol [phone] mg 2 to 3 times a day for breakthrough pain. Glute HEP  3 week follow up

## 2023-08-14 ENCOUNTER — Encounter: Payer: Self-pay | Admitting: Sports Medicine

## 2023-08-24 NOTE — Progress Notes (Unsigned)
    Jennifer [facility] D.[name] [address] [place] [address] Phone: [phone]   Assessment and Plan:     There are no diagnoses linked to this encounter.  ***   Pertinent previous records reviewed include ***    Follow Up: ***     Subjective:   I, Jennifer Lutz, am serving as a Neurosurgeon for Doctor Richardean Sale   Chief Complaint: low back pain    HPI:  09/03/2022 Jennifer Lutz is a 72 y.o. female coming in with complaint of R hip pain. Last seen in September 2022 for OMT. Patient has been seeing Dr. Jean Rosenthal. Last week her pain increased. Radiates down the leg to the knee. 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 Believes this motion may have exacerbated her pain.    08/04/23 Patient is a 72 year old female with concerns of low back pain. Patient states that she is in piriformis flare. She had to take 2 advil   08/25/2023 Patient states  Relevant Historical Information: Parkinson disease, osteoporosis  Additional pertinent review of systems negative.   Current Outpatient Medications:    aspirin 81 MG tablet, Take 81 mg by mouth daily., Disp: , Rfl:    carbidopa-levodopa (SINEMET IR) 25-100 MG tablet, TAKE 1 TABLET BY MOUTH THREE TIMES DAILY AT  7  AM  /  11  AM  /  4  PM., Disp: 270 tablet, Rfl: 0   magnesium 30 MG tablet, Take 30 mg by mouth daily., Disp: , Rfl:    meloxicam (MOBIC) 15 MG tablet, Take 1 tablet (15 mg total) by mouth daily., Disp: 14 tablet, Rfl: 0   Omega-3 Fatty Acids (OMEGA-3 FISH OIL) 500 MG CAPS, Take 50 mg by mouth., Disp: , Rfl:    OVER THE COUNTER MEDICATION, Calcium with D3 500 mg, Disp: , Rfl:    rosuvastatin (CRESTOR) 5 MG tablet, Take 1 tab (5 mg) 3 times per week., Disp: 30 tablet, Rfl: 3   Objective:     There were no vitals filed for this visit.    There is no height or weight on file to calculate BMI.     Physical Exam:    ***   Electronically signed by:  Jennifer [name] 8:28 AM 08/24/23

## 2023-08-25 ENCOUNTER — Ambulatory Visit (INDEPENDENT_AMBULATORY_CARE_PROVIDER_SITE_OTHER): Payer: Medicare HMO

## 2023-08-25 ENCOUNTER — Other Ambulatory Visit: Payer: Self-pay

## 2023-08-25 ENCOUNTER — Ambulatory Visit: Payer: Medicare HMO | Admitting: Sports Medicine

## 2023-08-25 VITALS — BP 110/80 | HR 81 | Ht 59.0 in | Wt 94.0 lb

## 2023-08-25 DIAGNOSIS — M25551 Pain in right hip: Secondary | ICD-10-CM

## 2023-08-25 DIAGNOSIS — M1611 Unilateral primary osteoarthritis, right hip: Secondary | ICD-10-CM

## 2023-08-25 NOTE — Patient Instructions (Signed)
Discontinue meloxicam Tylenol [phone] mg 2-3 times a day for pain relief  2 week follow up

## 2023-09-07 NOTE — Progress Notes (Signed)
Jenn[facility]utz [facility] [address] [place] [address] Phone: [phone]   Assessment and Plan:     1. Right hip pain 2. Primary osteoarthritis of right hip -Chronic with exacerbation, subsequent visit - Continued posterior right hip pain ongoing for 1+ year and worsening over the past several months.  Currently unclear etiology.  I suspect proximal hamstring pathology versus gluteal muscle pathology.  I suspected that osteoarthritis of right hip was a primary pain generator, however patient had only mild improvement in symptoms after intra-articular hip CSI at previous office visit on 08/25/2023, so I believe there is alternative pathology causing patient's ongoing pain - Due to failure to improve with >6 weeks of conservative therapy, pain affecting day-to-day activities, pain frequently >6/10, I recommend further evaluation with right hip MRI.  Order placed today - Recommend using Tylenol for day-to-day pain relief. - No improvement in the past with course of meloxicam 15 mg daily.  May continue to use meloxicam 15 mg daily as needed for breakthrough pain.  Recommend limiting chronic NSAIDs to 1-2 doses per week.  Patient did not have significant improvement with NSAID course   Pertinent previous records reviewed include none  Follow Up: 5 days after MRI to review results and discuss treatment plan   Subjective:   I, Jennifer Lutz, am serving as a Neurosurgeon for Doctor Richardean Sale   Chief Complaint: low back pain    HPI:  09/03/2022 Jennifer Lutz is a 72 y.o. female coming in with complaint of R hip pain. Last seen in September 2022 for OMT. Patient has been seeing Dr. Jean Rosenthal. Last week her pain increased. Radiates down the leg to the knee. 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 Believes this motion may have exacerbated her pain.     08/04/23 Patient is a 72 year old female with concerns of low back pain. Patient states that she is in piriformis flare. She had to take 2 advil    08/25/2023 Patient states she is the same . She has been miserable. Tylenol is not helping   09/08/2023 Patient states things have improved a little but she is not able to do her ADLS she is in to much pain still    Relevant Historical Information: Parkinson disease, osteoporosis  Additional pertinent review of systems negative.   Current Outpatient Medications:    aspirin 81 MG tablet, Take 81 mg by mouth daily., Disp: , Rfl:    carbidopa-levodopa (SINEMET IR) 25-100 MG tablet, TAKE 1 TABLET BY MOUTH THREE TIMES DAILY AT  7  AM  /  11  AM  /  4  PM., Disp: 270 tablet, Rfl: 0   magnesium 30 MG tablet, Take 30 mg by mouth daily., Disp: , Rfl:    meloxicam (MOBIC) 15 MG tablet, Take 1 tablet (15 mg total) by mouth daily., Disp: 14 tablet, Rfl: 0   Omega-3 Fatty Acids (OMEGA-3 FISH OIL) 500 MG CAPS, Take 50 mg by mouth., Disp: , Rfl:    OVER THE COUNTER MEDICATION, Calcium with D3 500 mg, Disp: , Rfl:    rosuvastatin (CRESTOR) 5 MG tablet, Take 1 tab (5 mg) 3 times per week., Disp: 30 tablet, Rfl: 3   Objective:     Vitals:   09/08/23 0945  BP: 120/80  Pulse: 70  SpO2: 100%  Weight: 93 lb (42.2  kg)  Height: 4\' 11"  (1.499 m)      Body mass index is 18.78 kg/m.    Physical Exam:    General: awake, alert, and oriented no acute distress, nontoxic Skin: no suspicious lesions or rashes Neuro:sensation intact distally with no deficits, normal muscle tone, no atrophy, strength 5/5 in all tested lower ext groups Psych: normal mood and affect, speech clear   Right hip: No deformity, swelling or wasting ROM Flexion 90, ext 30, IR 45, ER 45 TTP gluteal musculature, proximal hamstring NTTP over the hip flexors, greater trochanter,  si joint, lumbar spine Negative log roll with FROM Positive FABER for posterior hip pain Negative  FADIR Negative Piriformis test for radicular symptoms, though reproduced mild pain and tension Positive right trendelenberg Gait normal  No pain with lumbar extension    Electronically signed by:  Jennifer Lutz [facility] 12:09 PM 09/08/23

## 2023-09-08 ENCOUNTER — Ambulatory Visit: Payer: Medicare HMO | Admitting: Sports Medicine

## 2023-09-08 ENCOUNTER — Other Ambulatory Visit: Payer: Self-pay | Admitting: Sports Medicine

## 2023-09-08 VITALS — BP 120/80 | HR 70 | Ht 59.0 in | Wt 93.0 lb

## 2023-09-08 DIAGNOSIS — M25551 Pain in right hip: Secondary | ICD-10-CM | POA: Diagnosis not present

## 2023-09-08 DIAGNOSIS — M1611 Unilateral primary osteoarthritis, right hip: Secondary | ICD-10-CM | POA: Diagnosis not present

## 2023-09-08 DIAGNOSIS — F40240 Claustrophobia: Secondary | ICD-10-CM

## 2023-09-08 MED ORDER — LORAZEPAM 0.5 MG PO TABS: ORAL_TABLET | ORAL | 0 refills | Status: DC

## 2023-09-08 NOTE — Patient Instructions (Signed)
Right hip MRI Tylenol [phone] mg 2-3 times a day for pain relief  Follow up 5 days after MRI to discuss results  Heating pads

## 2023-09-08 NOTE — Progress Notes (Signed)
I have sent in Ativan 0.5 mg .  Patient can take 1-2 doses as needed for anxiety 30 minutes before MRI.

## 2023-09-21 ENCOUNTER — Encounter: Payer: Self-pay | Admitting: Sports Medicine

## 2023-09-25 ENCOUNTER — Ambulatory Visit
Admission: RE | Admit: 2023-09-25 | Discharge: 2023-09-25 | Disposition: A | Discharge status: Home / Self Care | Payer: Medicare HMO | Source: Ambulatory Visit | Attending: Sports Medicine | Admitting: Sports Medicine

## 2023-09-25 DIAGNOSIS — M25551 Pain in right hip: Secondary | ICD-10-CM

## 2023-09-25 DIAGNOSIS — M1711 Unilateral primary osteoarthritis, right knee: Secondary | ICD-10-CM | POA: Diagnosis not present

## 2023-09-25 DIAGNOSIS — M1611 Unilateral primary osteoarthritis, right hip: Secondary | ICD-10-CM

## 2023-10-01 NOTE — Progress Notes (Signed)
 Jennifer Lutz D.CLEMENTEEN AMYE Jennifer Lutz [address] [place] [address] Phone: [phone]   Assessment and Plan:     1. Right hip pain 2. Primary osteoarthritis of right hip 3. Partial tear of right hamstring  -Chronic with exacerbation, subsequent visit - Patient's symptoms are most consistent with a partial tearing of proximal right hamstring tendon as seen on MRI - Reviewed MRI from 09/25/2023 which showed partial tearing of right proximal hamstring tendon and tendinopathy, moderate osteoarthritis, anterior superior labral degeneration - Patient had essentially no relief from intra-articular hip CSI in the past - We discussed the risks of progressive tearing with CSI to proximal hamstring.  Patient elected to proceed with PRP injection to proximal hamstring tendon.  We will follow-up for procedure - Continue Tylenol  for day-to-day pain relief.  May continue meloxicam  15 mg daily as needed for breakthrough pain.  Recommend limiting chronic NSAIDs to 1-2 doses per week - Continue HEP and start physical therapy.  Referral sent   Pertinent previous records reviewed include right hip MRI 09/25/2023  Follow Up: Next week for PRP injection to proximal hamstring   Subjective:   I, Jennifer Lutz, am serving as a neurosurgeon for Doctor Jennifer Lutz   Chief Complaint: low back pain    HPI:  09/03/2022 Jennifer Lutz is a 73 y.o. female coming in with complaint of R hip pain. Last seen in September 2022 for OMT. Patient has been seeing Dr. Mace. Last week her pain increased. Radiates down the leg to the knee. 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 Believes this motion may have exacerbated her pain.    08/04/23 Patient is a 73 year old female with concerns of low back pain. Patient states that she is in piriformis flare. She had to take 2 advil    08/25/2023 Patient  states she is the same . She has been miserable. Tylenol  is not helping    09/08/2023 Patient states things have improved a little but she is not able to do her ADLS she is in to much pain still   10/02/2023 Patient states that she is feeling lousy. Mornings are okay but as the day goes on the pain increases    Relevant Historical Information: Parkinson disease, osteoporosis  Additional pertinent review of systems negative.   Current Outpatient Medications:    aspirin 81 MG tablet, Take 81 mg by mouth daily., Disp: , Rfl:    carbidopa -levodopa  (SINEMET  IR) 25-100 MG tablet, TAKE 1 TABLET BY MOUTH THREE TIMES DAILY AT  7  AM  /  11  AM  /  4  PM., Disp: 270 tablet, Rfl: 0   LORazepam  (ATIVAN ) 0.5 MG tablet, 1-2 tabs 30 - 60 min prior to MRI. Do not drive with this Lutz., Disp: 4 tablet, Rfl: 0   magnesium 30 MG tablet, Take 30 mg by mouth daily., Disp: , Rfl:    meloxicam  (MOBIC ) 15 MG tablet, Take 1 tablet (15 mg total) by mouth daily., Disp: 14 tablet, Rfl: 0   Omega-3 Fatty Acids (OMEGA-3 FISH OIL) 500 MG CAPS, Take 50 mg by mouth., Disp: , Rfl:    OVER THE COUNTER MEDICATION, Calcium  with D3 500 mg, Disp: , Rfl:    rosuvastatin  (CRESTOR ) 5 MG tablet, Take 1 tab (5 mg) 3 times per week., Disp: 30 tablet, Rfl: 3   Objective:  Vitals:   10/02/23 0834  BP: 122/84  Pulse: 86  SpO2: 98%  Weight: 96 lb (43.5 kg)  Height: 4' 11 (1.499 m)      Body mass index is 19.39 kg/m.    Physical Exam:    General: awake, alert, and oriented no acute distress, nontoxic Skin: no suspicious lesions or rashes Neuro:sensation intact distally with no deficits, normal muscle tone, no atrophy, strength 5/5 in all tested lower ext groups Psych: normal mood and affect, speech clear   Right hip: No deformity, swelling or wasting ROM Flexion 90, ext 30, IR 45, ER 45 TTP gluteal musculature, proximal hamstring NTTP over the hip flexors, greater trochanter,  si joint, lumbar spine Negative  log roll with FROM Positive FABER for posterior hip pain Negative FADIR Negative Piriformis test for radicular symptoms, though reproduced mild pain and tension Positive right trendelenberg Gait normal  No pain with lumbar extension    Electronically signed by:  Odis Lutz D.CLEMENTEEN AMYE Jennifer Lutz 9:07 AM 10/02/23

## 2023-10-02 ENCOUNTER — Ambulatory Visit: Payer: Medicare HMO | Admitting: Sports Medicine

## 2023-10-02 VITALS — BP 122/84 | HR 86 | Ht 59.0 in | Wt 96.0 lb

## 2023-10-02 DIAGNOSIS — M25551 Pain in right hip: Secondary | ICD-10-CM | POA: Diagnosis not present

## 2023-10-02 DIAGNOSIS — S76311A Strain of muscle, fascia and tendon of the posterior muscle group at thigh level, right thigh, initial encounter: Secondary | ICD-10-CM | POA: Diagnosis not present

## 2023-10-02 DIAGNOSIS — M1611 Unilateral primary osteoarthritis, right hip: Secondary | ICD-10-CM | POA: Diagnosis not present

## 2023-10-02 NOTE — Addendum Note (Signed)
 Addended by: Jerene Canny R on: 10/02/2023 09:18 AM   Modules accepted: Orders

## 2023-10-02 NOTE — Patient Instructions (Signed)
 PT referral  1 week follow up PRP

## 2023-10-08 NOTE — Progress Notes (Signed)
Aleen Sells D.[name] [address] [place] [address] Phone: [phone]   Assessment and Plan:     1. Right hip pain 2. Primary osteoarthritis of right hip 3. Partial tear of right hamstring -Chronic with exacerbation, subsequent visit - Patient's symptoms are still most consistent with partial tearing and tendinosis of the proximal right hamstring tendon as seen on MRI from 09/25/2023 - Patient elected for PRP injection to proximal hamstring at today's visit.  Tolerated well per note below - Patient essentially had no relief from intra-articular hip CSI in the past - Start Tylenol 500 to 1000 mg tablets 2-3 times a day for day-to-day pain relief - Start post PRP injection 6-week therapy program.  No NSAIDs or ice for 6 weeks.  Procedure: Ultrasound Guided Hamstring Origin (Ischial Tuberosity) Injection  Side: Right Diagnosis: Partial tearing of right hamstring and tendinosis [place] Indication:  - accuracy is paramount for diagnosis - to ensure therapeutic efficacy or procedural success - to reduce procedural risk  After explaining the procedure, viable alternatives, risks, and answering any questions, consent was given verbally. The site was cleaned with chlorhexidine prep. An ultrasound transducer was placed on the posterior upper thigh/buttock.  A needle was introduced to the tendon sheath ultrasound guidance with sterile technique.  A steroid injection was performed under ultrasound guidance with sterile technique using 6mL of PRP.  This was well tolerated and resulted in  relief.  Needle was removed and dressing placed and post injection instructions were given including  a discussion of likely return of pain today . Pt was advised to call or return to clinic if these symptoms worsen or fail to improve as anticipated.    Pertinent previous records reviewed include none  Follow Up: 5 to 6 weeks for reevaluation   Subjective:   I,  Jennifer Lutz, am serving as a Neurosurgeon for Doctor Richardean Sale   Chief Complaint: low back pain    HPI:  09/03/2022 Jennifer Lutz is a 73 y.o. female coming in with complaint of R hip pain. Last seen in September 2022 for OMT. Patient has been seeing Dr. Jean Rosenthal. Last week her pain increased. Radiates down the leg to the knee. 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 Believes this motion may have exacerbated her pain.    08/04/23 Patient is a 73 year old female with concerns of low back pain. Patient states that she is in piriformis flare. She had to take 2 advil    08/25/2023 Patient states she is the same . She has been miserable. Tylenol is not helping    09/08/2023 Patient states things have improved a little but she is not able to do her ADLS she is in to much pain still    10/02/2023 Patient states that she is feeling lousy. Mornings are okay but as the day goes on the pain increases   10/09/2023 Patient states   Relevant Historical Information: Parkinson disease, osteoporosis  Additional pertinent review of systems negative.   Current Outpatient Medications:    aspirin 81 MG tablet, Take 81 mg by mouth daily., Disp: , Rfl:    carbidopa-levodopa (SINEMET IR) 25-100 MG tablet, TAKE 1 TABLET BY MOUTH THREE TIMES DAILY AT  7  AM  /  11  AM  /  4  PM., Disp: 270 tablet, Rfl: 0   LORazepam (  ATIVAN) 0.5 MG tablet, 1-2 tabs 30 - 60 min prior to MRI. Do not drive with this medicine., Disp: 4 tablet, Rfl: 0   magnesium 30 MG tablet, Take 30 mg by mouth daily., Disp: , Rfl:    meloxicam (MOBIC) 15 MG tablet, Take 1 tablet (15 mg total) by mouth daily., Disp: 14 tablet, Rfl: 0   Omega-3 Fatty Acids (OMEGA-3 FISH OIL) 500 MG CAPS, Take 50 mg by mouth., Disp: , Rfl:    OVER THE COUNTER MEDICATION, Calcium with D3 500 mg, Disp: , Rfl:    rosuvastatin (CRESTOR) 5 MG tablet, Take 1 tab (5 mg) 3 times  per week., Disp: 30 tablet, Rfl: 3     Electronically signed by:  Aleen Sells D.[name] 10:08 AM 10/09/23

## 2023-10-09 ENCOUNTER — Ambulatory Visit: Payer: Self-pay | Admitting: Sports Medicine

## 2023-10-09 ENCOUNTER — Other Ambulatory Visit: Payer: Medicare HMO

## 2023-10-09 VITALS — BP 118/80 | HR 86 | Ht 59.0 in | Wt 96.0 lb

## 2023-10-09 DIAGNOSIS — M1611 Unilateral primary osteoarthritis, right hip: Secondary | ICD-10-CM

## 2023-10-09 DIAGNOSIS — M25551 Pain in right hip: Secondary | ICD-10-CM

## 2023-10-09 DIAGNOSIS — S76311A Strain of muscle, fascia and tendon of the posterior muscle group at thigh level, right thigh, initial encounter: Secondary | ICD-10-CM

## 2023-10-09 NOTE — Patient Instructions (Signed)
5-6 week follow up

## 2023-10-13 ENCOUNTER — Other Ambulatory Visit: Payer: Self-pay | Admitting: Neurology

## 2023-10-13 DIAGNOSIS — G20A1 Parkinson's disease without dyskinesia, without mention of fluctuations: Secondary | ICD-10-CM

## 2023-10-14 ENCOUNTER — Other Ambulatory Visit: Payer: Self-pay

## 2023-10-14 ENCOUNTER — Ambulatory Visit: Payer: Medicare HMO | Attending: Sports Medicine

## 2023-10-14 DIAGNOSIS — S76311A Strain of muscle, fascia and tendon of the posterior muscle group at thigh level, right thigh, initial encounter: Secondary | ICD-10-CM | POA: Diagnosis not present

## 2023-10-14 DIAGNOSIS — M1611 Unilateral primary osteoarthritis, right hip: Secondary | ICD-10-CM | POA: Diagnosis not present

## 2023-10-14 DIAGNOSIS — M6281 Muscle weakness (generalized): Secondary | ICD-10-CM | POA: Diagnosis not present

## 2023-10-14 DIAGNOSIS — M25551 Pain in right hip: Secondary | ICD-10-CM | POA: Insufficient documentation

## 2023-10-14 NOTE — Therapy (Signed)
OUTPATIENT PHYSICAL THERAPY LOWER EXTREMITY EVALUATION   Patient Name: Jennifer Lutz MRN: [id number] DOB:10/24/1950, 73 y.o., female Today's Date: 10/14/2023  END OF SESSION:  PT End of Session - 10/14/23 1657     Visit Number 1    Number of Visits 8    Date for PT Re-Evaluation 12/12/23    Authorization Type [employer]    PT Start Time 1445    PT Stop Time 1530    PT Time Calculation (min) 45 min    Activity Tolerance Patient tolerated treatment well    Behavior During Therapy [facility] for tasks assessed/performed             Past Medical History:  Diagnosis Date   Chronic kidney disease    reduced kidney function   Complication of anesthesia    'takes very little.' 'Hard to wake up'   Degenerative disc disease, lumbar    Hyperlipidemia    no meds- diet controlled   Medical history non-contributory    Neuromuscular disorder (HCC)    Parkinson's disease   Past Surgical History:  Procedure Laterality Date   COLONOSCOPY  2008   FASCIAL DEFECT REPAIR Left 1983   left thigh   NASAL SEPTUM SURGERY  09/23/1983   WRIST ARTHROSCOPY WITH ULNA SHORTENING Left 06/07/2015   Procedure: ARTHROSCOPY LEFT WRIST DEBRIDEMENT ULNAR SHORTENING;  Surgeon: Cindee Salt, MD;  Location: [facility];  Service: Orthopedics;  Laterality: Left;   WRIST OSTEOTOMY Left 06/07/2015   Procedure: LEFT OSTEOTOMY;  Surgeon: Cindee Salt, MD;  Location: [facility];  Service: Orthopedics;  Laterality: Left;   Patient Active Problem List   Diagnosis Date Noted   Low back pain 07/11/2020   Nonallopathic lesion of thoracic region 07/11/2020   Nonallopathic lesion of lumbar region 07/11/2020   Nonallopathic lesion of sacral region 07/11/2020   Parkinson's disease (HCC) 01/05/2020   Borderline high cholesterol 09/07/2017   Routine general medical examination at a health care facility 12/26/2013   OSTEOPOROSIS, IDIOPATHIC 07/08/2007    PCP: Deeann Saint, MD   REFERRING  PROVIDER: Richardean Sale, DO  REFERRING DIAG: [phone] (ICD-10-CM) - Right hip pain M16.11 (ICD-10-CM) - Primary osteoarthritis of right hip S76.311A (ICD-10-CM) - Partial tear of right hamstring  THERAPY DIAG:  Pain in right hip  Muscle weakness (generalized)  Rationale for Evaluation and Treatment: Rehabilitation  ONSET DATE: chronic  SUBJECTIVE:   SUBJECTIVE STATEMENT: Patient relates a 20 year history of R hip and hamstring pain/discomfort.  Patient limited in exercise routine due to pain.  Recent PRP injection benefit pending.  PERTINENT HISTORY: 1. Right hip pain 2. Primary osteoarthritis of right hip 3. Partial tear of right hamstring  -Chronic with exacerbation, subsequent visit - Patient's symptoms are most consistent with a partial tearing of proximal right hamstring tendon as seen on MRI - Reviewed MRI from 09/25/2023 which showed partial tearing of right proximal hamstring tendon and tendinopathy, moderate osteoarthritis, anterior superior labral degeneration - Patient had essentially no relief from intra-articular hip CSI in the past - We discussed the risks of progressive tearing with CSI to proximal hamstring.  Patient elected to proceed with PRP injection to proximal hamstring tendon.  We will follow-up for procedure - Continue Tylenol for day-to-day pain relief.  May continue meloxicam 15 mg daily as needed for breakthrough pain.  Recommend limiting chronic NSAIDs to 1-2 doses per week - Continue HEP and start physical therapy.  Referral sent     Pertinent previous records reviewed  include right hip MRI 09/25/2023   Follow Up: Next week for PRP injection to proximal hamstring PAIN:  Are you having pain? Yes: NPRS scale: 4-8/10 Pain location: R hip region Pain description: ache, sharp Aggravating factors: prolonged positioning(standing, sitting) Relieving factors: Heat, fig 4   PRECAUTIONS: None  RED FLAGS: None   WEIGHT BEARING RESTRICTIONS: No  FALLS:   Has patient fallen in last 6 months? No  OCCUPATION: retired  PLOF: Independent  PATIENT GOALS: To manage my pain  NEXT MD VISIT: Feb  OBJECTIVE:  Note: Objective measures were completed at Evaluation unless otherwise noted.  DIAGNOSTIC FINDINGS: IMPRESSION: 1. Moderate osteoarthritis of the right hip. 2. Substantial asymmetric partial tearing and tendinopathy of the right hamstring tendon. 3. Degenerated anterior superior labrum.     Electronically Signed   By: Gaylyn Rong M.D.   On: 10/02/2023 07:53  PATIENT SURVEYS:  FOTO 47(58 predicted)  MUSCLE LENGTH: Hamstrings: Right 80 deg; Left 80 deg Thomas test: negative R  POSTURE: No Significant postural limitations  PALPATION: Palpable TrPs to R medial/lateral hamstrings, irritable on R only  LOWER EXTREMITY ROM: WNL B  Active ROM Right eval Left eval  Hip flexion    Hip extension    Hip abduction    Hip adduction    Hip internal rotation    Hip external rotation    Knee flexion    Knee extension    Ankle dorsiflexion    Ankle plantarflexion    Ankle inversion    Ankle eversion     (Blank rows = not tested)  LOWER EXTREMITY MMT:  MMT Right eval Left eval  Hip flexion    Hip extension    Hip abduction 4   Hip adduction    Hip internal rotation    Hip external rotation    Knee flexion 4   Knee extension    Ankle dorsiflexion    Ankle plantarflexion    Ankle inversion    Ankle eversion     (Blank rows = not tested)  LOWER EXTREMITY SPECIAL TESTS:  Hip special tests: Luisa Hart (FABER) test: negative, Trendelenburg test: negative, Thomas test: negative, and Hip scouring test: negative  FUNCTIONAL TESTS:  30 seconds chair stand test  GAIT: Distance walked: 71ftx2 Assistive device utilized: None Level of assistance: Complete Independence Comments: mild antalgic gait, decreases R step length                                                                                                                                 TREATMENT DATE:  10/14/23 Eval   PATIENT EDUCATION:  Education details: Discussed eval findings, rehab rationale and POC and patient is in agreement  Person educated: Patient Education method: Explanation Education comprehension: verbalized understanding and needs further education  HOME EXERCISE PROGRAM: TBD  ASSESSMENT:  CLINICAL IMPRESSION: Patient is a 73 y.o. female who was seen today for physical therapy evaluation and treatment for R hip  pain and weakness.  Patient presents with good hip mobility w/o marked flexibility deficits in hamstrings or hip flexors.  Mild myofascial R quad tightness noted.  Strength deficits noted in R hip flexion and hip abduction.  Most prominent finding was palpable TrPs in R hamstrings with biceps femoris being irritable.  Patient is a good candidate for OPPT and dry needling to resolve chronic symptoms  OBJECTIVE IMPAIRMENTS: Abnormal gait, decreased activity tolerance, decreased endurance, difficulty walking, decreased strength, increased fascial restrictions, and pain.   ACTIVITY LIMITATIONS: carrying, sitting, standing, squatting, stairs, and locomotion level  PERSONAL FACTORS: Age, Past/current experiences, and Time since onset of injury/illness/exacerbation are also affecting patient's functional outcome.   REHAB POTENTIAL: Good  CLINICAL DECISION MAKING: Evolving/moderate complexity  EVALUATION COMPLEXITY: Low   GOALS: Goals reviewed with patient? No  SHORT TERM GOALS=LONG TERM GOALS: Target date: 11/14/23  Patient to demonstrate independence in HEP  Baseline: TBD Goal status: INITIAL  2.  Patient will acknowledge 2/10 pain at least once during episode of care   Baseline: 4-8/10 Goal status: INITIAL  3.  Patient will score at least 58% on FOTO to signify clinically meaningful improvement in functional abilities.   Baseline: 47% Goal status: INITIAL  4.  Increase R knee flexion and R hip  abduction strength to 4+/5 Baseline: 4/5 Goal status: INITIAL       PLAN:  PT FREQUENCY: 1-2x/week  PT DURATION: 6 weeks  PLANNED INTERVENTIONS: 97164- PT Re-evaluation, 97110-Therapeutic exercises, 97530- Therapeutic activity, [id number]- Neuromuscular re-education, 97535- Self Care, 13086- Manual therapy, [id number]- Gait training, [phone]- Aquatic Therapy, and Dry Needling  PLAN FOR NEXT SESSION: HEP review and update, manual techniques as appropriate, aerobic tasks, ROM and flexibility activities, strengthening and PREs, TPDN, gait and balance training as needed     Hildred Laser, PT 10/14/2023, 5:05 PM

## 2023-10-14 NOTE — Patient Instructions (Signed)

## 2023-10-15 NOTE — Therapy (Signed)
OUTPATIENT PHYSICAL THERAPY LOWER EXTREMITY EVALUATION   Patient Name: Jennifer Lutz MRN: [id number] DOB:[birth year], 73 y.o., female Today's Date: 10/19/2023  END OF SESSION:    Past Medical History:  Diagnosis Date   Chronic kidney disease    reduced kidney function   Complication of anesthesia    'takes very little.' 'Hard to wake up'   Degenerative disc disease, lumbar    Hyperlipidemia    no meds- diet controlled   Medical history non-contributory    Neuromuscular disorder (HCC)    Parkinson's disease   Past Surgical History:  Procedure Laterality Date   COLONOSCOPY  2008   FASCIAL DEFECT REPAIR Left 1983   left thigh   NASAL SEPTUM SURGERY  09/23/1983   WRIST ARTHROSCOPY WITH ULNA SHORTENING Left 06/07/2015   Procedure: ARTHROSCOPY LEFT WRIST DEBRIDEMENT ULNAR SHORTENING;  Surgeon: Cindee Salt, MD;  Location: [facility];  Service: Orthopedics;  Laterality: Left;   WRIST OSTEOTOMY Left 06/07/2015   Procedure: LEFT OSTEOTOMY;  Surgeon: Cindee Salt, MD;  Location: [facility];  Service: Orthopedics;  Laterality: Left;   Patient Active Problem List   Diagnosis Date Noted   Low back pain 07/11/2020   Nonallopathic lesion of thoracic region 07/11/2020   Nonallopathic lesion of lumbar region 07/11/2020   Nonallopathic lesion of sacral region 07/11/2020   Parkinson's disease (HCC) 01/05/2020   Borderline high cholesterol 09/07/2017   Routine general medical examination at a health care facility 12/26/2013   OSTEOPOROSIS, IDIOPATHIC 07/08/2007    PCP: Deeann Saint, MD   REFERRING PROVIDER: Richardean Sale, DO  REFERRING DIAG: [phone] (ICD-10-CM) - Right hip pain M16.11 (ICD-10-CM) - Primary osteoarthritis of right hip S76.311A (ICD-10-CM) - Partial tear of right hamstring  THERAPY DIAG:  Pain in right hip  Muscle weakness (generalized)  Rationale for Evaluation and Treatment: Rehabilitation  ONSET DATE:  chronic  SUBJECTIVE:   SUBJECTIVE STATEMENT: No questions or concerns following last session. Agreeable to TPDN today in R hamstring region  PERTINENT HISTORY: 1. Right hip pain 2. Primary osteoarthritis of right hip 3. Partial tear of right hamstring  -Chronic with exacerbation, subsequent visit - Patient's symptoms are most consistent with a partial tearing of proximal right hamstring tendon as seen on MRI - Reviewed MRI from 09/25/2023 which showed partial tearing of right proximal hamstring tendon and tendinopathy, moderate osteoarthritis, anterior superior labral degeneration - Patient had essentially no relief from intra-articular hip CSI in the past - We discussed the risks of progressive tearing with CSI to proximal hamstring.  Patient elected to proceed with PRP injection to proximal hamstring tendon.  We will follow-up for procedure - Continue Tylenol for day-to-day pain relief.  May continue meloxicam 15 mg daily as needed for breakthrough pain.  Recommend limiting chronic NSAIDs to 1-2 doses per week - Continue HEP and start physical therapy.  Referral sent     Pertinent previous records reviewed include right hip MRI 09/25/2023   Follow Up: Next week for PRP injection to proximal hamstring PAIN:  Are you having pain? Yes: NPRS scale: 4-8/10 Pain location: R hip region Pain description: ache, sharp Aggravating factors: prolonged positioning(standing, sitting) Relieving factors: Heat, fig 4   PRECAUTIONS: None  RED FLAGS: None   WEIGHT BEARING RESTRICTIONS: No  FALLS:  Has patient fallen in last 6 months? No  OCCUPATION: retired  PLOF: Independent  PATIENT GOALS: To manage my pain  NEXT MD VISIT: Feb 2024  OBJECTIVE:  Note: Objective measures were completed at  Evaluation unless otherwise noted.  DIAGNOSTIC FINDINGS: IMPRESSION: 1. Moderate osteoarthritis of the right hip. 2. Substantial asymmetric partial tearing and tendinopathy of the right hamstring  tendon. 3. Degenerated anterior superior labrum.     Electronically Signed   By: Gaylyn Rong M.D.   On: 10/02/2023 07:53  PATIENT SURVEYS:  FOTO 47(58 predicted)  MUSCLE LENGTH: Hamstrings: Right 80 deg; Left 80 deg Thomas test: negative R  POSTURE: No Significant postural limitations  PALPATION: Palpable TrPs to R medial/lateral hamstrings, irritable on R only  LOWER EXTREMITY ROM: WNL B  Active ROM Right eval Left eval  Hip flexion    Hip extension    Hip abduction    Hip adduction    Hip internal rotation    Hip external rotation    Knee flexion    Knee extension    Ankle dorsiflexion    Ankle plantarflexion    Ankle inversion    Ankle eversion     (Blank rows = not tested)  LOWER EXTREMITY MMT:  MMT Right eval Left eval  Hip flexion    Hip extension    Hip abduction 4   Hip adduction    Hip internal rotation    Hip external rotation    Knee flexion 4   Knee extension    Ankle dorsiflexion    Ankle plantarflexion    Ankle inversion    Ankle eversion     (Blank rows = not tested)  LOWER EXTREMITY SPECIAL TESTS:  Hip special tests: Luisa Hart (FABER) test: negative, Trendelenburg test: negative, Thomas test: negative, and Hip scouring test: negative  FUNCTIONAL TESTS:  30 seconds chair stand test 10/19/23 13 reps arms crossed  GAIT: Distance walked: 88ftx2 Assistive device utilized: None Level of assistance: Complete Independence Comments: mild antalgic gait, decreases R step length                                                                                                                                TREATMENT DATE:  [facility] Adult PT Treatment:                                                DATE: 10/19/23 Therapeutic Exercise: Nustep L2 8 min Seated hamstring stretch R 30s x2 R hip flexor stretch 30s x2 Bridge 15x  Manual Therapy: Skilled palpation to identify taught and irritable bands in R semi membranosus/tendinosis Trigger  Point Dry Needling  Initial Treatment: Pt instructed on Dry Needling rational, procedures, and possible side effects. Pt instructed to expect mild to moderate muscle soreness later in the day and/or into the next day.  Pt instructed in methods to reduce muscle soreness. Pt instructed to continue prescribed HEP. Patient was educated on signs and symptoms of infection and other risk factors and advised to seek medical attention  should they occur.  Patient verbalized understanding of these instructions and education.   Patient Verbal Consent Given: Yes Education Handout Provided: Previously Provided Muscles Treated: R semimembranosus and tendinosis Electrical Stimulation Performed: No Treatment Response/Outcome: multiple twitch responses, decreased tone and tenderness to palpation    10/14/23 Eval   PATIENT EDUCATION:  Education details: Discussed eval findings, rehab rationale and POC and patient is in agreement  Person educated: Patient Education method: Explanation Education comprehension: verbalized understanding and needs further education  HOME EXERCISE PROGRAM: TBD  ASSESSMENT:  CLINICAL IMPRESSION: First f/u session began with TPDN to R medial hamstring group with good twitch reposes elicited.  Followed session with aerobic work, stretching and strengthening tasks to stress hamstring group.  Patient is a 73 y.o. female who was seen today for physical therapy evaluation and treatment for R hip pain and weakness.  Patient presents with good hip mobility w/o marked flexibility deficits in hamstrings or hip flexors.  Mild myofascial R quad tightness noted.  Strength deficits noted in R hip flexion and hip abduction.  Most prominent finding was palpable TrPs in R hamstrings with biceps femoris being irritable.  Patient is a good candidate for OPPT and dry needling to resolve chronic symptoms  OBJECTIVE IMPAIRMENTS: Abnormal gait, decreased activity tolerance, decreased endurance,  difficulty walking, decreased strength, increased fascial restrictions, and pain.   ACTIVITY LIMITATIONS: carrying, sitting, standing, squatting, stairs, and locomotion level  PERSONAL FACTORS: Age, Past/current experiences, and Time since onset of injury/illness/exacerbation are also affecting patient's functional outcome.   REHAB POTENTIAL: Good  CLINICAL DECISION MAKING: Evolving/moderate complexity  EVALUATION COMPLEXITY: Low   GOALS: Goals reviewed with patient? No  SHORT TERM GOALS=LONG TERM GOALS: Target date: 11/14/23  Patient to demonstrate independence in HEP  Baseline: TBD Goal status: INITIAL  2.  Patient will acknowledge 2/10 pain at least once during episode of care   Baseline: 4-8/10 Goal status: INITIAL  3.  Patient will score at least 58% on FOTO to signify clinically meaningful improvement in functional abilities.   Baseline: 47% Goal status: INITIAL  4.  Increase R knee flexion and R hip abduction strength to 4+/5 Baseline: 4/5 Goal status: INITIAL       PLAN:  PT FREQUENCY: 1-2x/week  PT DURATION: 6 weeks  PLANNED INTERVENTIONS: 97164- PT Re-evaluation, 97110-Therapeutic exercises, 97530- Therapeutic activity, [id number]- Neuromuscular re-education, 97535- Self Care, [id number]- Manual therapy, [id number]- Gait training, [id number]- Aquatic Therapy, and Dry Needling  PLAN FOR NEXT SESSION: HEP review and update, manual techniques as appropriate, aerobic tasks, ROM and flexibility activities, strengthening and PREs, TPDN, gait and balance training as needed     Hildred Laser, PT 10/19/2023, 5:37 PM

## 2023-10-19 ENCOUNTER — Ambulatory Visit: Payer: Medicare HMO

## 2023-10-19 DIAGNOSIS — M25551 Pain in right hip: Secondary | ICD-10-CM | POA: Diagnosis not present

## 2023-10-19 DIAGNOSIS — M6281 Muscle weakness (generalized): Secondary | ICD-10-CM

## 2023-10-19 DIAGNOSIS — M1611 Unilateral primary osteoarthritis, right hip: Secondary | ICD-10-CM | POA: Diagnosis not present

## 2023-10-19 DIAGNOSIS — S76311A Strain of muscle, fascia and tendon of the posterior muscle group at thigh level, right thigh, initial encounter: Secondary | ICD-10-CM | POA: Diagnosis not present

## 2023-10-20 NOTE — Therapy (Unsigned)
OUTPATIENT PHYSICAL THERAPY LOWER EXTREMITY EVALUATION   Patient Name: Jennifer Lutz MRN: [id number] DOB:01-10-1951, 73 y.o., female Today's Date: 10/21/2023  END OF SESSION:  PT End of Session - 10/21/23 1650     Visit Number 3    Number of Visits 8    Date for PT Re-Evaluation 12/12/23    Authorization Type [employer]    PT Start Time 1650    PT Stop Time 1730    PT Time Calculation (min) 40 min    Activity Tolerance Patient tolerated treatment well    Behavior During Therapy [facility] for tasks assessed/performed              Past Medical History:  Diagnosis Date   Chronic kidney disease    reduced kidney function   Complication of anesthesia    'takes very little.' 'Hard to wake up'   Degenerative disc disease, lumbar    Hyperlipidemia    no meds- diet controlled   Medical history non-contributory    Neuromuscular disorder (HCC)    Parkinson's disease   Past Surgical History:  Procedure Laterality Date   COLONOSCOPY  2008   FASCIAL DEFECT REPAIR Left 1983   left thigh   NASAL SEPTUM SURGERY  09/23/1983   WRIST ARTHROSCOPY WITH ULNA SHORTENING Left 06/07/2015   Procedure: ARTHROSCOPY LEFT WRIST DEBRIDEMENT ULNAR SHORTENING;  Surgeon: Cindee Salt, MD;  Location: [facility];  Service: Orthopedics;  Laterality: Left;   WRIST OSTEOTOMY Left 06/07/2015   Procedure: LEFT OSTEOTOMY;  Surgeon: Cindee Salt, MD;  Location: [facility];  Service: Orthopedics;  Laterality: Left;   Patient Active Problem List   Diagnosis Date Noted   Low back pain 07/11/2020   Nonallopathic lesion of thoracic region 07/11/2020   Nonallopathic lesion of lumbar region 07/11/2020   Nonallopathic lesion of sacral region 07/11/2020   Parkinson's disease (HCC) 01/05/2020   Borderline high cholesterol 09/07/2017   Routine general medical examination at a health care facility 12/26/2013   OSTEOPOROSIS, IDIOPATHIC 07/08/2007    PCP: Deeann Saint, MD   REFERRING  PROVIDER: Richardean Sale, DO  REFERRING DIAG: [phone] (ICD-10-CM) - Right hip pain M16.11 (ICD-10-CM) - Primary osteoarthritis of right hip S76.311A (ICD-10-CM) - Partial tear of right hamstring  THERAPY DIAG:  Pain in right hip  Muscle weakness (generalized)  Rationale for Evaluation and Treatment: Rehabilitation  ONSET DATE: chronic  SUBJECTIVE:   SUBJECTIVE STATEMENT: Less intense symptoms, less limp when walking.  Also notes longer periods of relief b/t bouts of tightness  PERTINENT HISTORY: 1. Right hip pain 2. Primary osteoarthritis of right hip 3. Partial tear of right hamstring  -Chronic with exacerbation, subsequent visit - Patient's symptoms are most consistent with a partial tearing of proximal right hamstring tendon as seen on MRI - Reviewed MRI from 09/25/2023 which showed partial tearing of right proximal hamstring tendon and tendinopathy, moderate osteoarthritis, anterior superior labral degeneration - Patient had essentially no relief from intra-articular hip CSI in the past - We discussed the risks of progressive tearing with CSI to proximal hamstring.  Patient elected to proceed with PRP injection to proximal hamstring tendon.  We will follow-up for procedure - Continue Tylenol for day-to-day pain relief.  May continue meloxicam 15 mg daily as needed for breakthrough pain.  Recommend limiting chronic NSAIDs to 1-2 doses per week - Continue HEP and start physical therapy.  Referral sent     Pertinent previous records reviewed include right hip MRI 09/25/2023   Follow  Up: Next week for PRP injection to proximal hamstring PAIN:  Are you having pain? Yes: NPRS scale: 4-8/10 Pain location: R hip region Pain description: ache, sharp Aggravating factors: prolonged positioning(standing, sitting) Relieving factors: Heat, fig 4   PRECAUTIONS: None  RED FLAGS: None   WEIGHT BEARING RESTRICTIONS: No  FALLS:  Has patient fallen in last 6 months? No  OCCUPATION:  retired  PLOF: Independent  PATIENT GOALS: To manage my pain  NEXT MD VISIT: Feb 2024  OBJECTIVE:  Note: Objective measures were completed at Evaluation unless otherwise noted.  DIAGNOSTIC FINDINGS: IMPRESSION: 1. Moderate osteoarthritis of the right hip. 2. Substantial asymmetric partial tearing and tendinopathy of the right hamstring tendon. 3. Degenerated anterior superior labrum.     Electronically Signed   By: Gaylyn Rong M.D.   On: 10/02/2023 07:53  PATIENT SURVEYS:  FOTO 47(58 predicted)  MUSCLE LENGTH: Hamstrings: Right 80 deg; Left 80 deg Thomas test: negative R  POSTURE: No Significant postural limitations  PALPATION: Palpable TrPs to R medial/lateral hamstrings, irritable on R only  LOWER EXTREMITY ROM: WNL B  Active ROM Right eval Left eval  Hip flexion    Hip extension    Hip abduction    Hip adduction    Hip internal rotation    Hip external rotation    Knee flexion    Knee extension    Ankle dorsiflexion    Ankle plantarflexion    Ankle inversion    Ankle eversion     (Blank rows = not tested)  LOWER EXTREMITY MMT:  MMT Right eval Left eval  Hip flexion    Hip extension    Hip abduction 4   Hip adduction    Hip internal rotation    Hip external rotation    Knee flexion 4   Knee extension    Ankle dorsiflexion    Ankle plantarflexion    Ankle inversion    Ankle eversion     (Blank rows = not tested)  LOWER EXTREMITY SPECIAL TESTS:  Hip special tests: Luisa Hart (FABER) test: negative, Trendelenburg test: negative, Thomas test: negative, and Hip scouring test: negative  FUNCTIONAL TESTS:  30 seconds chair stand test 10/19/23 13 reps arms crossed  GAIT: Distance walked: 70ftx2 Assistive device utilized: None Level of assistance: Complete Independence Comments: mild antalgic gait, decreases R step length                                                                                                                                 TREATMENT DATE:  [facility] Adult PT Treatment:                                                DATE: 10/21/23 Therapeutic Exercise: Nustep L4 6 min Supine hip fallouts RTB 2s 10x B, 10/10  unilaterally Bridge against RTB 2s 10x Bridge with ball 2s 10x Hip flexor stretch 30s R Hamstring stretch 30s R  Manual Therapy: Skilled palpation to identify taught and irritable bands in R medial hamstrings and adductors Trigger Point Dry Needling  Subsequent Treatment: Instructions provided previously at initial dry needling treatment.   Patient Verbal Consent Given: Yes Education Handout Provided: Previously Provided Muscles Treated: R semimembranosus/tendinosis, adductor magnus  Electrical Stimulation Performed: No Treatment Response/Outcome: less TTP, less tone    [facility] Adult PT Treatment:                                                DATE: 10/19/23 Therapeutic Exercise: Nustep L2 8 min Seated hamstring stretch R 30s x2 R hip flexor stretch 30s x2 Bridge 15x  Manual Therapy: Skilled palpation to identify taught and irritable bands in R semi membranosus/tendinosis Trigger Point Dry Needling  Initial Treatment: Pt instructed on Dry Needling rational, procedures, and possible side effects. Pt instructed to expect mild to moderate muscle soreness later in the day and/or into the next day.  Pt instructed in methods to reduce muscle soreness. Pt instructed to continue prescribed HEP. Patient was educated on signs and symptoms of infection and other risk factors and advised to seek medical attention should they occur.  Patient verbalized understanding of these instructions and education.   Patient Verbal Consent Given: Yes Education Handout Provided: Previously Provided Muscles Treated: R semimembranosus and tendinosis Electrical Stimulation Performed: No Treatment Response/Outcome: multiple twitch responses, decreased tone and tenderness to palpation    10/14/23 Eval   PATIENT  EDUCATION:  Education details: Discussed eval findings, rehab rationale and POC and patient is in agreement  Person educated: Patient Education method: Explanation Education comprehension: verbalized understanding and needs further education  HOME EXERCISE PROGRAM: Access Code: [email or web address] URL: [email or web address] Date: 10/21/2023 Prepared by: Gustavus Bryant  Exercises - Bridge  - 1 x daily - 5 x weekly - 2 sets - 10 reps - Hip Flexor Stretch at Edge of Bed  - 1 x daily - 5 x weekly - 1 sets - 2 reps - 30s hold - Seated Table Hamstring Stretch  - 1 x daily - 5 x weekly - 1 sets - 2 reps - 30s hold  ASSESSMENT:  CLINICAL IMPRESSION: Good response to dry needle treatment.  R medial hamstrings less reactive today.  Assessed R adductor group with tenderness/irritability and TPDN applied with twitch responses noted.  Followed up with hip strength and stability exercises and issued written HEP.  Patient is a 73 y.o. female who was seen today for physical therapy evaluation and treatment for R hip pain and weakness.  Patient presents with good hip mobility w/o marked flexibility deficits in hamstrings or hip flexors.  Mild myofascial R quad tightness noted.  Strength deficits noted in R hip flexion and hip abduction.  Most prominent finding was palpable TrPs in R hamstrings with biceps femoris being irritable.  Patient is a good candidate for OPPT and dry needling to resolve chronic symptoms  OBJECTIVE IMPAIRMENTS: Abnormal gait, decreased activity tolerance, decreased endurance, difficulty walking, decreased strength, increased fascial restrictions, and pain.   ACTIVITY LIMITATIONS: carrying, sitting, standing, squatting, stairs, and locomotion level  PERSONAL FACTORS: Age, Past/current experiences, and Time since onset of injury/illness/exacerbation are also affecting patient's functional outcome.   REHAB POTENTIAL: Good  CLINICAL DECISION MAKING:  Evolving/moderate  complexity  EVALUATION COMPLEXITY: Low   GOALS: Goals reviewed with patient? No  SHORT TERM GOALS=LONG TERM GOALS: Target date: 11/14/23  Patient to demonstrate independence in HEP  Baseline: [email or web address] Goal status: INITIAL  2.  Patient will acknowledge 2/10 pain at least once during episode of care   Baseline: 4-8/10 Goal status: INITIAL  3.  Patient will score at least 58% on FOTO to signify clinically meaningful improvement in functional abilities.   Baseline: 47% Goal status: INITIAL  4.  Increase R knee flexion and R hip abduction strength to 4+/5 Baseline: 4/5 Goal status: INITIAL       PLAN:  PT FREQUENCY: 1-2x/week  PT DURATION: 6 weeks  PLANNED INTERVENTIONS: 97164- PT Re-evaluation, 97110-Therapeutic exercises, 97530- Therapeutic activity, [id number]- Neuromuscular re-education, 97535- Self Care, [id number]- Manual therapy, [id number]- Gait training, [phone]- Aquatic Therapy, and Dry Needling  PLAN FOR NEXT SESSION: HEP review and update, manual techniques as appropriate, aerobic tasks, ROM and flexibility activities, strengthening and PREs, TPDN, gait and balance training as needed     Hildred Laser, PT 10/21/2023, 5:54 PM

## 2023-10-21 ENCOUNTER — Ambulatory Visit: Payer: Medicare HMO

## 2023-10-21 DIAGNOSIS — M6281 Muscle weakness (generalized): Secondary | ICD-10-CM

## 2023-10-21 DIAGNOSIS — M1611 Unilateral primary osteoarthritis, right hip: Secondary | ICD-10-CM | POA: Diagnosis not present

## 2023-10-21 DIAGNOSIS — M25551 Pain in right hip: Secondary | ICD-10-CM | POA: Diagnosis not present

## 2023-10-21 DIAGNOSIS — S76311A Strain of muscle, fascia and tendon of the posterior muscle group at thigh level, right thigh, initial encounter: Secondary | ICD-10-CM | POA: Diagnosis not present

## 2023-10-22 NOTE — Therapy (Signed)
OUTPATIENT PHYSICAL THERAPY TREATMENT NOTE   Patient Name: Mariyanna Mucha MRN: [id number] DOB:Feb 03, 1951, 73 y.o., female Today's Date: 10/26/2023  END OF SESSION:  PT End of Session - 10/26/23 1353     Visit Number 4    Number of Visits 8    Date for PT Re-Evaluation 12/12/23    Authorization Type Aet[employer]    PT Start Time 1355    PT Stop Time 1435    PT Time Calculation (min) 40 min    Activity Tolerance Patient tolerated treatment well    Behavior During Therapy [facility] for tasks assessed/performed               Past Medical History:  Diagnosis Date   Chronic kidney disease    reduced kidney function   Complication of anesthesia    'takes very little.' 'Hard to wake up'   Degenerative disc disease, lumbar    Hyperlipidemia    no meds- diet controlled   Medical history non-contributory    Neuromuscular disorder (HCC)    Parkinson's disease   Past Surgical History:  Procedure Laterality Date   COLONOSCOPY  2008   FASCIAL DEFECT REPAIR Left 1983   left thigh   NASAL SEPTUM SURGERY  09/23/1983   WRIST ARTHROSCOPY WITH ULNA SHORTENING Left 06/07/2015   Procedure: ARTHROSCOPY LEFT WRIST DEBRIDEMENT ULNAR SHORTENING;  Surgeon: Cindee Salt, MD;  Location: [facility];  Service: Orthopedics;  Laterality: Left;   WRIST OSTEOTOMY Left 06/07/2015   Procedure: LEFT OSTEOTOMY;  Surgeon: Cindee Salt, MD;  Location: [facility];  Service: Orthopedics;  Laterality: Left;   Patient Active Problem List   Diagnosis Date Noted   Low back pain 07/11/2020   Nonallopathic lesion of thoracic region 07/11/2020   Nonallopathic lesion of lumbar region 07/11/2020   Nonallopathic lesion of sacral region 07/11/2020   Parkinson's disease (HCC) 01/05/2020   Borderline high cholesterol 09/07/2017   Routine general medical examination at a health care facility 12/26/2013   OSTEOPOROSIS, IDIOPATHIC 07/08/2007    PCP: Deeann Saint, MD   REFERRING PROVIDER:  Richardean Sale, DO  REFERRING DIAG: [phone] (ICD-10-CM) - Right hip pain M16.11 (ICD-10-CM) - Primary osteoarthritis of right hip S76.311A (ICD-10-CM) - Partial tear of right hamstring  THERAPY DIAG:  Pain in right hip  Muscle weakness (generalized)  Rationale for Evaluation and Treatment: Rehabilitation  ONSET DATE: chronic  SUBJECTIVE:   SUBJECTIVE STATEMENT: Feels much better, less discomfort with squatting tasks.  Will begin to phase into gym routine this week  PERTINENT HISTORY: 1. Right hip pain 2. Primary osteoarthritis of right hip 3. Partial tear of right hamstring  -Chronic with exacerbation, subsequent visit - Patient's symptoms are most consistent with a partial tearing of proximal right hamstring tendon as seen on MRI - Reviewed MRI from 09/25/2023 which showed partial tearing of right proximal hamstring tendon and tendinopathy, moderate osteoarthritis, anterior superior labral degeneration - Patient had essentially no relief from intra-articular hip CSI in the past - We discussed the risks of progressive tearing with CSI to proximal hamstring.  Patient elected to proceed with PRP injection to proximal hamstring tendon.  We will follow-up for procedure - Continue Tylenol for day-to-day pain relief.  May continue meloxicam 15 mg daily as needed for breakthrough pain.  Recommend limiting chronic NSAIDs to 1-2 doses per week - Continue HEP and start physical therapy.  Referral sent     Pertinent previous records reviewed include right hip MRI 09/25/2023   Follow  Up: Next week for PRP injection to proximal hamstring PAIN:  Are you having pain? Yes: NPRS scale: 4-8/10 Pain location: R hip region Pain description: ache, sharp Aggravating factors: prolonged positioning(standing, sitting) Relieving factors: Heat, fig 4   PRECAUTIONS: None  RED FLAGS: None   WEIGHT BEARING RESTRICTIONS: No  FALLS:  Has patient fallen in last 6 months? No  OCCUPATION:  retired  PLOF: Independent  PATIENT GOALS: To manage my pain  NEXT MD VISIT: Feb 2024  OBJECTIVE:  Note: Objective measures were completed at Evaluation unless otherwise noted.  DIAGNOSTIC FINDINGS: IMPRESSION: 1. Moderate osteoarthritis of the right hip. 2. Substantial asymmetric partial tearing and tendinopathy of the right hamstring tendon. 3. Degenerated anterior superior labrum.     Electronically Signed   By: Gaylyn Rong M.D.   On: 10/02/2023 07:53  PATIENT SURVEYS:  FOTO 47(58 predicted)  MUSCLE LENGTH: Hamstrings: Right 80 deg; Left 80 deg Thomas test: negative R  POSTURE: No Significant postural limitations  PALPATION: Palpable TrPs to R medial/lateral hamstrings, irritable on R only  LOWER EXTREMITY ROM: WNL B  Active ROM Right eval Left eval  Hip flexion    Hip extension    Hip abduction    Hip adduction    Hip internal rotation    Hip external rotation    Knee flexion    Knee extension    Ankle dorsiflexion    Ankle plantarflexion    Ankle inversion    Ankle eversion     (Blank rows = not tested)  LOWER EXTREMITY MMT:  MMT Right eval Left eval  Hip flexion    Hip extension    Hip abduction 4   Hip adduction    Hip internal rotation    Hip external rotation    Knee flexion 4   Knee extension    Ankle dorsiflexion    Ankle plantarflexion    Ankle inversion    Ankle eversion     (Blank rows = not tested)  LOWER EXTREMITY SPECIAL TESTS:  Hip special tests: Luisa Hart (FABER) test: negative, Trendelenburg test: negative, Thomas test: negative, and Hip scouring test: negative  FUNCTIONAL TESTS:  30 seconds chair stand test 10/19/23 13 reps arms crossed  GAIT: Distance walked: 8ftx2 Assistive device utilized: None Level of assistance: Complete Independence Comments: mild antalgic gait, decreases R step length                                                                                                                                 TREATMENT DATE:  [facility] Adult PT Treatment:                                                DATE: 10/26/23 Therapeutic Exercise: Nustep L5 8 min Manual Therapy: Skilled palpation to identify taught and  irritable bands in R adductor magnus Trigger Point Dry Needling  Subsequent Treatment: Instructions provided previously at initial dry needling treatment.   Patient Verbal Consent Given: Yes Education Handout Provided: Yes Muscles Treated: R adductor magnus Electrical Stimulation Performed: No Treatment Response/Outcome: less tenderness and soft tissue restriction post technique   Neuromuscular re-ed: Cable walks 4 way 10# 5 reps each direction Runners step 4 in 10/10 with UE support   [facility] Adult PT Treatment:                                                DATE: 10/21/23 Therapeutic Exercise: Nustep L4 6 min Supine hip fallouts RTB 2s 10x B, 10/10 unilaterally Bridge against RTB 2s 10x Bridge with ball 2s 10x Hip flexor stretch 30s R Hamstring stretch 30s R  Manual Therapy: Skilled palpation to identify taught and irritable bands in R medial hamstrings and adductors Trigger Point Dry Needling  Subsequent Treatment: Instructions provided previously at initial dry needling treatment.   Patient Verbal Consent Given: Yes Education Handout Provided: Previously Provided Muscles Treated: R semimembranosus/tendinosis, adductor magnus  Electrical Stimulation Performed: No Treatment Response/Outcome: less TTP, less tone    [facility] Adult PT Treatment:                                                DATE: 10/19/23 Therapeutic Exercise: Nustep L2 8 min Seated hamstring stretch R 30s x2 R hip flexor stretch 30s x2 Bridge 15x  Manual Therapy: Skilled palpation to identify taught and irritable bands in R semi membranosus/tendinosis Trigger Point Dry Needling  Initial Treatment: Pt instructed on Dry Needling rational, procedures, and possible side effects. Pt instructed to expect  mild to moderate muscle soreness later in the day and/or into the next day.  Pt instructed in methods to reduce muscle soreness. Pt instructed to continue prescribed HEP. Patient was educated on signs and symptoms of infection and other risk factors and advised to seek medical attention should they occur.  Patient verbalized understanding of these instructions and education.   Patient Verbal Consent Given: Yes Education Handout Provided: Previously Provided Muscles Treated: R semimembranosus and tendinosis Electrical Stimulation Performed: No Treatment Response/Outcome: multiple twitch responses, decreased tone and tenderness to palpation    10/14/23 Eval   PATIENT EDUCATION:  Education details: Discussed eval findings, rehab rationale and POC and patient is in agreement  Person educated: Patient Education method: Explanation Education comprehension: verbalized understanding and needs further education  HOME EXERCISE PROGRAM: Access Code: [email or web address] URL: [email or web address] Date: 10/21/2023 Prepared by: Gustavus Bryant  Exercises - Bridge  - 1 x daily - 5 x weekly - 2 sets - 10 reps - Hip Flexor Stretch at Edge of Bed  - 1 x daily - 5 x weekly - 1 sets - 2 reps - 30s hold - Seated Table Hamstring Stretch  - 1 x daily - 5 x weekly - 1 sets - 2 reps - 30s hold  ASSESSMENT:  CLINICAL IMPRESSION: Continues to respond well to PT.  Has been able to return to squatting w/o symptoms and is able to do more each week.  Added resistance and challenge today including con/ecc tasks, SLS and balance and proprioceptive work.  Patient is a 73  y.o. female who was seen today for physical therapy evaluation and treatment for R hip pain and weakness.  Patient presents with good hip mobility w/o marked flexibility deficits in hamstrings or hip flexors.  Mild myofascial R quad tightness noted.  Strength deficits noted in R hip flexion and hip abduction.  Most prominent finding was palpable  TrPs in R hamstrings with biceps femoris being irritable.  Patient is a good candidate for OPPT and dry needling to resolve chronic symptoms  OBJECTIVE IMPAIRMENTS: Abnormal gait, decreased activity tolerance, decreased endurance, difficulty walking, decreased strength, increased fascial restrictions, and pain.   ACTIVITY LIMITATIONS: carrying, sitting, standing, squatting, stairs, and locomotion level  PERSONAL FACTORS: Age, Past/current experiences, and Time since onset of injury/illness/exacerbation are also affecting patient's functional outcome.   REHAB POTENTIAL: Good  CLINICAL DECISION MAKING: Evolving/moderate complexity  EVALUATION COMPLEXITY: Low   GOALS: Goals reviewed with patient? No  SHORT TERM GOALS=LONG TERM GOALS: Target date: 11/14/23  Patient to demonstrate independence in HEP  Baseline: [id number] Goal status: INITIAL  2.  Patient will acknowledge 2/10 pain at least once during episode of care   Baseline: 4-8/10 Goal status: INITIAL  3.  Patient will score at least 58% on FOTO to signify clinically meaningful improvement in functional abilities.   Baseline: 47% Goal status: INITIAL  4.  Increase R knee flexion and R hip abduction strength to 4+/5 Baseline: 4/5 Goal status: INITIAL       PLAN:  PT FREQUENCY: 1-2x/week  PT DURATION: 6 weeks  PLANNED INTERVENTIONS: 97164- PT Re-evaluation, 97110-Therapeutic exercises, 97530- Therapeutic activity, [id number]- Neuromuscular re-education, 97535- Self Care, 09811- Manual therapy, [id number]- Gait training, [phone]- Aquatic Therapy, and Dry Needling  PLAN FOR NEXT SESSION: HEP review and update, manual techniques as appropriate, aerobic tasks, ROM and flexibility activities, strengthening and PREs, TPDN, gait and balance training as needed     Hildred Laser, PT 10/26/2023, 2:40 PM

## 2023-10-26 ENCOUNTER — Ambulatory Visit: Payer: Medicare HMO | Attending: Sports Medicine

## 2023-10-26 DIAGNOSIS — M25551 Pain in right hip: Secondary | ICD-10-CM | POA: Diagnosis not present

## 2023-10-26 DIAGNOSIS — M6281 Muscle weakness (generalized): Secondary | ICD-10-CM | POA: Insufficient documentation

## 2023-10-27 NOTE — Therapy (Signed)
 OUTPATIENT PHYSICAL THERAPY TREATMENT NOTE   Patient Name: Jennifer Lutz MRN: [id number] DOB:[birth year], 73 y.o., female Today's Date: 10/30/2023  END OF SESSION:  PT End of Session - 10/30/23 0915     Visit Number 5    Number of Visits 8    Date for PT Re-Evaluation 12/12/23    Authorization Type Aetna    PT Start Time 0915    PT Stop Time 0955    PT Time Calculation (min) 40 min    Activity Tolerance Patient tolerated treatment well    Behavior During Therapy [facility] for tasks assessed/performed                Past Medical History:  Diagnosis Date   Chronic kidney disease    reduced kidney function   Complication of anesthesia    'takes very little.' 'Hard to wake up'   Degenerative disc disease, lumbar    Hyperlipidemia    no meds- diet controlled   Medical history non-contributory    Neuromuscular disorder (HCC)    Parkinson's disease   Past Surgical History:  Procedure Laterality Date   COLONOSCOPY  2008   FASCIAL DEFECT REPAIR Left 1983   left thigh   NASAL SEPTUM SURGERY  09/23/1983   WRIST ARTHROSCOPY WITH ULNA SHORTENING Left 06/07/2015   Procedure: ARTHROSCOPY LEFT WRIST DEBRIDEMENT ULNAR SHORTENING;  Surgeon: Arley Curia, MD;  Location: [facility];  Service: Orthopedics;  Laterality: Left;   WRIST OSTEOTOMY Left 06/07/2015   Procedure: LEFT OSTEOTOMY;  Surgeon: Arley Curia, MD;  Location: [facility];  Service: Orthopedics;  Laterality: Left;   Patient Active Problem List   Diagnosis Date Noted   Low back pain 07/11/2020   Nonallopathic lesion of thoracic region 07/11/2020   Nonallopathic lesion of lumbar region 07/11/2020   Nonallopathic lesion of sacral region 07/11/2020   Parkinson's disease (HCC) 01/05/2020   Borderline high cholesterol 09/07/2017   Routine general medical examination at a health care facility 12/26/2013   OSTEOPOROSIS, IDIOPATHIC 07/08/2007    PCP: Mercer Clotilda SAUNDERS, MD   REFERRING  PROVIDER: Leonce Katz, DO  REFERRING DIAG: [phone] (ICD-10-CM) - Right hip pain M16.11 (ICD-10-CM) - Primary osteoarthritis of right hip S76.311A (ICD-10-CM) - Partial tear of right hamstring  THERAPY DIAG:  Pain in right hip  Muscle weakness (generalized)  Rationale for Evaluation and Treatment: Rehabilitation  ONSET DATE: chronic  SUBJECTIVE:   SUBJECTIVE STATEMENT: Continues to note a decrease in scope of symptoms as well as intensity level.    PERTINENT HISTORY: 1. Right hip pain 2. Primary osteoarthritis of right hip 3. Partial tear of right hamstring  -Chronic with exacerbation, subsequent visit - Patient's symptoms are most consistent with a partial tearing of proximal right hamstring tendon as seen on MRI - Reviewed MRI from 09/25/2023 which showed partial tearing of right proximal hamstring tendon and tendinopathy, moderate osteoarthritis, anterior superior labral degeneration - Patient had essentially no relief from intra-articular hip CSI in the past - We discussed the risks of progressive tearing with CSI to proximal hamstring.  Patient elected to proceed with PRP injection to proximal hamstring tendon.  We will follow-up for procedure - Continue Tylenol  for day-to-day pain relief.  May continue meloxicam  15 mg daily as needed for breakthrough pain.  Recommend limiting chronic NSAIDs to 1-2 doses per week - Continue HEP and start physical therapy.  Referral sent     Pertinent previous records reviewed include right hip MRI 09/25/2023   Follow Up:  Next week for PRP injection to proximal hamstring PAIN:  Are you having pain? Yes: NPRS scale: 4-8/10 Pain location: R hip region Pain description: ache, sharp Aggravating factors: prolonged positioning(standing, sitting) Relieving factors: Heat, fig 4   PRECAUTIONS: None  RED FLAGS: None   WEIGHT BEARING RESTRICTIONS: No  FALLS:  Has patient fallen in last 6 months? No  OCCUPATION: retired  PLOF:  Independent  PATIENT GOALS: To manage my pain  NEXT MD VISIT: Feb 2024  OBJECTIVE:  Note: Objective measures were completed at Evaluation unless otherwise noted.  DIAGNOSTIC FINDINGS: IMPRESSION: 1. Moderate osteoarthritis of the right hip. 2. Substantial asymmetric partial tearing and tendinopathy of the right hamstring tendon. 3. Degenerated anterior superior labrum.     Electronically Signed   By: Ryan Salvage M.D.   On: 10/02/2023 07:53  PATIENT SURVEYS:  FOTO 47(58 predicted)  MUSCLE LENGTH: Hamstrings: Right 80 deg; Left 80 deg Thomas test: negative R  POSTURE: No Significant postural limitations  PALPATION: Palpable TrPs to R medial/lateral hamstrings, irritable on R only  LOWER EXTREMITY ROM: WNL B  Active ROM Right eval Left eval  Hip flexion    Hip extension    Hip abduction    Hip adduction    Hip internal rotation    Hip external rotation    Knee flexion    Knee extension    Ankle dorsiflexion    Ankle plantarflexion    Ankle inversion    Ankle eversion     (Blank rows = not tested)  LOWER EXTREMITY MMT:  MMT Right eval Left eval  Hip flexion    Hip extension    Hip abduction 4   Hip adduction    Hip internal rotation    Hip external rotation    Knee flexion 4   Knee extension    Ankle dorsiflexion    Ankle plantarflexion    Ankle inversion    Ankle eversion     (Blank rows = not tested)  LOWER EXTREMITY SPECIAL TESTS:  Hip special tests: Belvie (FABER) test: negative, Trendelenburg test: negative, Thomas test: negative, and Hip scouring test: negative  FUNCTIONAL TESTS:  30 seconds chair stand test 10/19/23 13 reps arms crossed  GAIT: Distance walked: 78ftx2 Assistive device utilized: None Level of assistance: Complete Independence Comments: mild antalgic gait, decreases R step length                                                                                                                                 TREATMENT DATE:  [facility] Adult PT Treatment:                                                DATE: 10/30/23 Therapeutic Exercise:  Nustep L6 8 min Manual Therapy:  Skilled palpation to identify taught  and irritable bands in R adductor magnus recreating patient symptos as well as gauging depth of tissue involved Trigger Point Dry Needling  Subsequent Treatment: Instructions provided previously at initial dry needling treatment.   Patient Verbal Consent Given: Yes Education Handout Provided: Previously Provided Muscles Treated: R adductor magnus 30x30 needle size Electrical Stimulation Performed: No Treatment Response/Outcome: Less pain/symptom Neuromuscular re-ed: Cable walks 4 way 13# 5 reps each direction Runners step 4 in 10/10 with UE support 5# KB  [facility] Adult PT Treatment:                                                DATE: 10/26/23 Therapeutic Exercise: Nustep L5 8 min Manual Therapy: Skilled palpation to identify taught and irritable bands in R adductor magnus Trigger Point Dry Needling  Subsequent Treatment: Instructions provided previously at initial dry needling treatment.   Patient Verbal Consent Given: Yes Education Handout Provided: Yes Muscles Treated: R adductor magnus Electrical Stimulation Performed: No Treatment Response/Outcome: less tenderness and soft tissue restriction post technique   Neuromuscular re-ed: Cable walks 4 way 10# 5 reps each direction Runners step 4 in 10/10 with UE support   [facility] Adult PT Treatment:                                                DATE: 10/21/23 Therapeutic Exercise: Nustep L4 6 min Supine hip fallouts RTB 2s 10x B, 10/10 unilaterally Bridge against RTB 2s 10x Bridge with ball 2s 10x Hip flexor stretch 30s R Hamstring stretch 30s R  Manual Therapy: Skilled palpation to identify taught and irritable bands in R medial hamstrings and adductors Trigger Point Dry Needling  Subsequent Treatment: Instructions provided  previously at initial dry needling treatment.   Patient Verbal Consent Given: Yes Education Handout Provided: Previously Provided Muscles Treated: R semimembranosus/tendinosis, adductor magnus  Electrical Stimulation Performed: No Treatment Response/Outcome: less TTP, less tone    [facility] Adult PT Treatment:                                                DATE: 10/19/23 Therapeutic Exercise: Nustep L2 8 min Seated hamstring stretch R 30s x2 R hip flexor stretch 30s x2 Bridge 15x  Manual Therapy: Skilled palpation to identify taught and irritable bands in R semi membranosus/tendinosis Trigger Point Dry Needling  Initial Treatment: Pt instructed on Dry Needling rational, procedures, and possible side effects. Pt instructed to expect mild to moderate muscle soreness later in the day and/or into the next day.  Pt instructed in methods to reduce muscle soreness. Pt instructed to continue prescribed HEP. Patient was educated on signs and symptoms of infection and other risk factors and advised to seek medical attention should they occur.  Patient verbalized understanding of these instructions and education.   Patient Verbal Consent Given: Yes Education Handout Provided: Previously Provided Muscles Treated: R semimembranosus and tendinosis Electrical Stimulation Performed: No Treatment Response/Outcome: multiple twitch responses, decreased tone and tenderness to palpation    10/14/23 Eval   PATIENT EDUCATION:  Education details: Discussed eval findings, rehab rationale and POC and patient  is in agreement  Person educated: Patient Education method: Explanation Education comprehension: verbalized understanding and needs further education  HOME EXERCISE PROGRAM: Access Code: [email or web address] URL: [email or web address] Date: 10/21/2023 Prepared by: Reyes Kohut  Exercises - Bridge  - 1 x daily - 5 x weekly - 2 sets - 10 reps - Hip Flexor Stretch at Edge of Bed  - 1 x daily -  5 x weekly - 1 sets - 2 reps - 30s hold - Seated Table Hamstring Stretch  - 1 x daily - 5 x weekly - 1 sets - 2 reps - 30s hold  ASSESSMENT:  CLINICAL IMPRESSION: Symptoms markedly improved, has ben able to increase her activity level and return to prolonged gardening w/o any aggravation in symptom.  Flexibility deficits noted with deep squats.  Today's session added additional resistance and stress to RLE   Patient is a 73 y.o. female who was seen today for physical therapy evaluation and treatment for R hip pain and weakness.  Patient presents with good hip mobility w/o marked flexibility deficits in hamstrings or hip flexors.  Mild myofascial R quad tightness noted.  Strength deficits noted in R hip flexion and hip abduction.  Most prominent finding was palpable TrPs in R hamstrings with biceps femoris being irritable.  Patient is a good candidate for OPPT and dry needling to resolve chronic symptoms  OBJECTIVE IMPAIRMENTS: Abnormal gait, decreased activity tolerance, decreased endurance, difficulty walking, decreased strength, increased fascial restrictions, and pain.   ACTIVITY LIMITATIONS: carrying, sitting, standing, squatting, stairs, and locomotion level  PERSONAL FACTORS: Age, Past/current experiences, and Time since onset of injury/illness/exacerbation are also affecting patient's functional outcome.   REHAB POTENTIAL: Good  CLINICAL DECISION MAKING: Evolving/moderate complexity  EVALUATION COMPLEXITY: Low   GOALS: Goals reviewed with patient? No  SHORT TERM GOALS=LONG TERM GOALS: Target date: 11/14/23  Patient to demonstrate independence in HEP  Baseline: [id number] Goal status: INITIAL  2.  Patient will acknowledge 2/10 pain at least once during episode of care   Baseline: 4-8/10 Goal status: INITIAL  3.  Patient will score at least 58% on FOTO to signify clinically meaningful improvement in functional abilities.   Baseline: 47% Goal status: INITIAL  4.  Increase R  knee flexion and R hip abduction strength to 4+/5 Baseline: 4/5 Goal status: INITIAL       PLAN:  PT FREQUENCY: 1-2x/week  PT DURATION: 6 weeks  PLANNED INTERVENTIONS: 97164- PT Re-evaluation, 97110-Therapeutic exercises, 97530- Therapeutic activity, [id number]- Neuromuscular re-education, 97535- Self Care, 02859- Manual therapy, [id number]- Gait training, [phone]- Aquatic Therapy, and Dry Needling  PLAN FOR NEXT SESSION: HEP review and update, manual techniques as appropriate, aerobic tasks, ROM and flexibility activities, strengthening and PREs, TPDN, gait and balance training as needed     Reyes CHRISTELLA Kohut, PT 10/30/2023, 10:02 AM

## 2023-10-30 ENCOUNTER — Ambulatory Visit: Payer: Medicare HMO

## 2023-10-30 DIAGNOSIS — M25551 Pain in right hip: Secondary | ICD-10-CM

## 2023-10-30 DIAGNOSIS — M6281 Muscle weakness (generalized): Secondary | ICD-10-CM

## 2023-11-02 NOTE — Therapy (Signed)
 OUTPATIENT PHYSICAL THERAPY TREATMENT NOTE   Patient Name: Jennifer Lutz MRN: [id number] DOB:10-Oct-1950, 73 y.o., female Today's Date: 11/03/2023  END OF SESSION:  PT End of Session - 11/03/23 1311     Visit Number 6    Number of Visits 8    Date for PT Re-Evaluation 12/12/23    Authorization Type [employer]    PT Start Time 1315    PT Stop Time 1400    PT Time Calculation (min) 45 min    Activity Tolerance Patient tolerated treatment well    Behavior During Therapy [facility] for tasks assessed/performed                 Past Medical History:  Diagnosis Date   Chronic kidney disease    reduced kidney function   Complication of anesthesia    'takes very little.' 'Hard to wake up'   Degenerative disc disease, lumbar    Hyperlipidemia    no meds- diet controlled   Medical history non-contributory    Neuromuscular disorder (HCC)    Parkinson's disease   Past Surgical History:  Procedure Laterality Date   COLONOSCOPY  2008   FASCIAL DEFECT REPAIR Left 1983   left thigh   NASAL SEPTUM SURGERY  09/23/1983   WRIST ARTHROSCOPY WITH ULNA SHORTENING Left 06/07/2015   Procedure: ARTHROSCOPY LEFT WRIST DEBRIDEMENT ULNAR SHORTENING;  Surgeon: Cindee Salt, MD;  Location: [facility];  Service: Orthopedics;  Laterality: Left;   WRIST OSTEOTOMY Left 06/07/2015   Procedure: LEFT OSTEOTOMY;  Surgeon: Cindee Salt, MD;  Location: [facility];  Service: Orthopedics;  Laterality: Left;   Patient Active Problem List   Diagnosis Date Noted   Low back pain 07/11/2020   Nonallopathic lesion of thoracic region 07/11/2020   Nonallopathic lesion of lumbar region 07/11/2020   Nonallopathic lesion of sacral region 07/11/2020   Parkinson's disease (HCC) 01/05/2020   Borderline high cholesterol 09/07/2017   Routine general medical examination at a health care facility 12/26/2013   OSTEOPOROSIS, IDIOPATHIC 07/08/2007    PCP: Deeann Saint, MD   REFERRING  PROVIDER: Richardean Sale, DO  REFERRING DIAG: [phone] (ICD-10-CM) - Right hip pain M16.11 (ICD-10-CM) - Primary osteoarthritis of right hip S76.311A (ICD-10-CM) - Partial tear of right hamstring  THERAPY DIAG:  Pain in right hip  Muscle weakness (generalized)  Rationale for Evaluation and Treatment: Rehabilitation  ONSET DATE: chronic  SUBJECTIVE:   SUBJECTIVE STATEMENT: Arrives to PT with increased symptoms.  Since last session, has been very active, returning to golfing and gym routine.    PERTINENT HISTORY: 1. Right hip pain 2. Primary osteoarthritis of right hip 3. Partial tear of right hamstring  -Chronic with exacerbation, subsequent visit - Patient's symptoms are most consistent with a partial tearing of proximal right hamstring tendon as seen on MRI - Reviewed MRI from 09/25/2023 which showed partial tearing of right proximal hamstring tendon and tendinopathy, moderate osteoarthritis, anterior superior labral degeneration - Patient had essentially no relief from intra-articular hip CSI in the past - We discussed the risks of progressive tearing with CSI to proximal hamstring.  Patient elected to proceed with PRP injection to proximal hamstring tendon.  We will follow-up for procedure - Continue Tylenol for day-to-day pain relief.  May continue meloxicam 15 mg daily as needed for breakthrough pain.  Recommend limiting chronic NSAIDs to 1-2 doses per week - Continue HEP and start physical therapy.  Referral sent     Pertinent previous records reviewed include right  hip MRI 09/25/2023   Follow Up: Next week for PRP injection to proximal hamstring PAIN:  Are you having pain? Yes: NPRS scale: 4-8/10 Pain location: R hip region Pain description: ache, sharp Aggravating factors: prolonged positioning(standing, sitting) Relieving factors: Heat, fig 4   PRECAUTIONS: None  RED FLAGS: None   WEIGHT BEARING RESTRICTIONS: No  FALLS:  Has patient fallen in last 6 months?  No  OCCUPATION: retired  PLOF: Independent  PATIENT GOALS: To manage my pain  NEXT MD VISIT: Feb 2024  OBJECTIVE:  Note: Objective measures were completed at Evaluation unless otherwise noted.  DIAGNOSTIC FINDINGS: IMPRESSION: 1. Moderate osteoarthritis of the right hip. 2. Substantial asymmetric partial tearing and tendinopathy of the right hamstring tendon. 3. Degenerated anterior superior labrum.     Electronically Signed   By: Gaylyn Rong M.D.   On: 10/02/2023 07:53  PATIENT SURVEYS:  FOTO 47(58 predicted) ; 11/03/23 56%  MUSCLE LENGTH: Hamstrings: Right 80 deg; Left 80 deg; 11/03/23 90d B Thomas test: negative R  POSTURE: No Significant postural limitations  PALPATION: Palpable TrPs to R medial/lateral hamstrings, irritable on R only  LOWER EXTREMITY ROM: WNL B  Active ROM Right eval Left eval  Hip flexion    Hip extension    Hip abduction    Hip adduction    Hip internal rotation    Hip external rotation    Knee flexion    Knee extension    Ankle dorsiflexion    Ankle plantarflexion    Ankle inversion    Ankle eversion     (Blank rows = not tested)  LOWER EXTREMITY MMT:  MMT Right eval Left eval  Hip flexion    Hip extension    Hip abduction 4   Hip adduction    Hip internal rotation    Hip external rotation    Knee flexion 4   Knee extension    Ankle dorsiflexion    Ankle plantarflexion    Ankle inversion    Ankle eversion     (Blank rows = not tested)  LOWER EXTREMITY SPECIAL TESTS:  Hip special tests: Luisa Hart (FABER) test: negative, Trendelenburg test: negative, Thomas test: negative, and Hip scouring test: negative  FUNCTIONAL TESTS:  30 seconds chair stand test 10/19/23 13 reps arms crossed  GAIT: Distance walked: 67ftx2 Assistive device utilized: None Level of assistance: Complete Independence Comments: mild antalgic gait, decreases R step length                                                                                                                                 TREATMENT DATE:  [facility] Adult PT Treatment:                                                DATE: 11/03/23 Therapeutic Exercise:  Nustep L6 8 min R hamstring stretch in sitting 30s x2  Therapeutic Activity: Single leg bridge R 15x2  Prone knee flexion R YTB 15x2 Golfers reach R 15x Runners step 6 in 15/15 with UE support 5# KB Assessment of R hamstring flexibility as well as eccentric strength testing to replicate symptoms  [facility] Adult PT Treatment:                                                DATE: 10/30/23 Therapeutic Exercise:  Nustep L6 8 min Manual Therapy:  Skilled palpation to identify taught and irritable bands in R adductor magnus recreating patient symptos as well as gauging depth of tissue involved Trigger Point Dry Needling  Subsequent Treatment: Instructions provided previously at initial dry needling treatment.   Patient Verbal Consent Given: Yes Education Handout Provided: Previously Provided Muscles Treated: R adductor magnus 30x30 needle size Electrical Stimulation Performed: No Treatment Response/Outcome: Less pain/symptom Neuromuscular re-ed: Cable walks 4 way 13# 5 reps each direction Runners step 4 in 10/10 with UE support 5# KB  [facility] Adult PT Treatment:                                                DATE: 10/26/23 Therapeutic Exercise: Nustep L5 8 min Manual Therapy: Skilled palpation to identify taught and irritable bands in R adductor magnus Trigger Point Dry Needling  Subsequent Treatment: Instructions provided previously at initial dry needling treatment.   Patient Verbal Consent Given: Yes Education Handout Provided: Yes Muscles Treated: R adductor magnus Electrical Stimulation Performed: No Treatment Response/Outcome: less tenderness and soft tissue restriction post technique   Neuromuscular re-ed: Cable walks 4 way 10# 5 reps each direction Runners step 4 in 10/10 with UE support   [facility]  Adult PT Treatment:                                                DATE: 10/21/23 Therapeutic Exercise: Nustep L4 6 min Supine hip fallouts RTB 2s 10x B, 10/10 unilaterally Bridge against RTB 2s 10x Bridge with ball 2s 10x Hip flexor stretch 30s R Hamstring stretch 30s R  Manual Therapy: Skilled palpation to identify taught and irritable bands in R medial hamstrings and adductors Trigger Point Dry Needling  Subsequent Treatment: Instructions provided previously at initial dry needling treatment.   Patient Verbal Consent Given: Yes Education Handout Provided: Previously Provided Muscles Treated: R semimembranosus/tendinosis, adductor magnus  Electrical Stimulation Performed: No Treatment Response/Outcome: less TTP, less tone    [facility] Adult PT Treatment:                                                DATE: 10/19/23 Therapeutic Exercise: Nustep L2 8 min Seated hamstring stretch R 30s x2 R hip flexor stretch 30s x2 Bridge 15x  Manual Therapy: Skilled palpation to identify taught and irritable bands in R semi membranosus/tendinosis Trigger Point Dry Needling  Initial Treatment: Pt instructed on Dry Needling rational,  procedures, and possible side effects. Pt instructed to expect mild to moderate muscle soreness later in the day and/or into the next day.  Pt instructed in methods to reduce muscle soreness. Pt instructed to continue prescribed HEP. Patient was educated on signs and symptoms of infection and other risk factors and advised to seek medical attention should they occur.  Patient verbalized understanding of these instructions and education.   Patient Verbal Consent Given: Yes Education Handout Provided: Previously Provided Muscles Treated: R semimembranosus and tendinosis Electrical Stimulation Performed: No Treatment Response/Outcome: multiple twitch responses, decreased tone and tenderness to palpation    10/14/23 Eval   PATIENT EDUCATION:  Education details:  Discussed eval findings, rehab rationale and POC and patient is in agreement  Person educated: Patient Education method: Explanation Education comprehension: verbalized understanding and needs further education  HOME EXERCISE PROGRAM: Access Code: [email or web address] URL: [email or web address] Date: 10/21/2023 Prepared by: Gustavus Bryant  Exercises - Bridge  - 1 x daily - 5 x weekly - 2 sets - 10 reps - Hip Flexor Stretch at Edge of Bed  - 1 x daily - 5 x weekly - 1 sets - 2 reps - 30s hold - Seated Table Hamstring Stretch  - 1 x daily - 5 x weekly - 1 sets - 2 reps - 30s hold  ASSESSMENT:  CLINICAL IMPRESSION: patient returns to PT with increased pain following increased level of activity since last session.  No deficits in hip mobility or hamstring flexibility but pain reported with resisted hamstring strength testing.  Treatment focused on additional hamstring strengthening tasks, no indications for dry needling identified.  HEP updated.  Patient is a 73 y.o. female who was seen today for physical therapy evaluation and treatment for R hip pain and weakness.  Patient presents with good hip mobility w/o marked flexibility deficits in hamstrings or hip flexors.  Mild myofascial R quad tightness noted.  Strength deficits noted in R hip flexion and hip abduction.  Most prominent finding was palpable TrPs in R hamstrings with biceps femoris being irritable.  Patient is a good candidate for OPPT and dry needling to resolve chronic symptoms  OBJECTIVE IMPAIRMENTS: Abnormal gait, decreased activity tolerance, decreased endurance, difficulty walking, decreased strength, increased fascial restrictions, and pain.   ACTIVITY LIMITATIONS: carrying, sitting, standing, squatting, stairs, and locomotion level  PERSONAL FACTORS: Age, Past/current experiences, and Time since onset of injury/illness/exacerbation are also affecting patient's functional outcome.   REHAB POTENTIAL: Good  CLINICAL  DECISION MAKING: Evolving/moderate complexity  EVALUATION COMPLEXITY: Low   GOALS: Goals reviewed with patient? No  SHORT TERM GOALS=LONG TERM GOALS: Target date: 11/14/23  Patient to demonstrate independence in HEP  Baseline: [email or web address] Goal status: INITIAL  2.  Patient will acknowledge 2/10 pain at least once during episode of care   Baseline: 4-8/10 Goal status: INITIAL  3.  Patient will score at least 58% on FOTO to signify clinically meaningful improvement in functional abilities.   Baseline: 47% Goal status: INITIAL  4.  Increase R knee flexion and R hip abduction strength to 4+/5 Baseline: 4/5 Goal status: INITIAL       PLAN:  PT FREQUENCY: 1-2x/week  PT DURATION: 6 weeks  PLANNED INTERVENTIONS: 97164- PT Re-evaluation, 97110-Therapeutic exercises, 97530- Therapeutic activity, 97112- Neuromuscular re-education, 97535- Self Care, 16109- Manual therapy, [id number]- Gait training, [id number]- Aquatic Therapy, and Dry Needling  PLAN FOR NEXT SESSION: HEP review and update, manual techniques as appropriate, aerobic tasks, ROM and flexibility activities, strengthening and PREs, TPDN, gait and balance training  as needed     Hildred Laser, PT 11/03/2023, 3:00 PM

## 2023-11-03 ENCOUNTER — Ambulatory Visit: Payer: Medicare HMO

## 2023-11-03 DIAGNOSIS — M6281 Muscle weakness (generalized): Secondary | ICD-10-CM

## 2023-11-03 DIAGNOSIS — M25551 Pain in right hip: Secondary | ICD-10-CM

## 2023-11-04 NOTE — Therapy (Unsigned)
OUTPATIENT PHYSICAL THERAPY TREATMENT NOTE   Patient Name: Jennifer Lutz MRN: [id number] DOB:04/08/1951, 73 y.o., female Today's Date: 11/05/2023  END OF SESSION:  PT End of Session - 11/05/23 1350     Visit Number 7    Number of Visits 8    Date for PT Re-Evaluation 12/12/23    Authorization Type [employer]    PT Start Time 1400    PT Stop Time 1440    PT Time Calculation (min) 40 min    Activity Tolerance Patient tolerated treatment well    Behavior During Therapy [facility] for tasks assessed/performed                  Past Medical History:  Diagnosis Date   Chronic kidney disease    reduced kidney function   Complication of anesthesia    'takes very little.' 'Hard to wake up'   Degenerative disc disease, lumbar    Hyperlipidemia    no meds- diet controlled   Medical history non-contributory    Neuromuscular disorder (HCC)    Parkinson's disease   Past Surgical History:  Procedure Laterality Date   COLONOSCOPY  2008   FASCIAL DEFECT REPAIR Left 1983   left thigh   NASAL SEPTUM SURGERY  09/23/1983   WRIST ARTHROSCOPY WITH ULNA SHORTENING Left 06/07/2015   Procedure: ARTHROSCOPY LEFT WRIST DEBRIDEMENT ULNAR SHORTENING;  Surgeon: Cindee Salt, MD;  Location: [facility];  Service: Orthopedics;  Laterality: Left;   WRIST OSTEOTOMY Left 06/07/2015   Procedure: LEFT OSTEOTOMY;  Surgeon: Cindee Salt, MD;  Location: [facility];  Service: Orthopedics;  Laterality: Left;   Patient Active Problem List   Diagnosis Date Noted   Low back pain 07/11/2020   Nonallopathic lesion of thoracic region 07/11/2020   Nonallopathic lesion of lumbar region 07/11/2020   Nonallopathic lesion of sacral region 07/11/2020   Parkinson's disease (HCC) 01/05/2020   Borderline high cholesterol 09/07/2017   Routine general medical examination at a health care facility 12/26/2013   OSTEOPOROSIS, IDIOPATHIC 07/08/2007    PCP: Deeann Saint, MD   REFERRING  PROVIDER: Richardean Sale, DO  REFERRING DIAG: [phone] (ICD-10-CM) - Right hip pain M16.11 (ICD-10-CM) - Primary osteoarthritis of right hip S76.311A (ICD-10-CM) - Partial tear of right hamstring  THERAPY DIAG:  Pain in right hip  Muscle weakness (generalized)  Rationale for Evaluation and Treatment: Rehabilitation  ONSET DATE: chronic  SUBJECTIVE:   SUBJECTIVE STATEMENT:    PERTINENT HISTORY: 1. Right hip pain 2. Primary osteoarthritis of right hip 3. Partial tear of right hamstring  -Chronic with exacerbation, subsequent visit - Patient's symptoms are most consistent with a partial tearing of proximal right hamstring tendon as seen on MRI - Reviewed MRI from 09/25/2023 which showed partial tearing of right proximal hamstring tendon and tendinopathy, moderate osteoarthritis, anterior superior labral degeneration - Patient had essentially no relief from intra-articular hip CSI in the past - We discussed the risks of progressive tearing with CSI to proximal hamstring.  Patient elected to proceed with PRP injection to proximal hamstring tendon.  We will follow-up for procedure - Continue Tylenol for day-to-day pain relief.  May continue meloxicam 15 mg daily as needed for breakthrough pain.  Recommend limiting chronic NSAIDs to 1-2 doses per week - Continue HEP and start physical therapy.  Referral sent     Pertinent previous records reviewed include right hip MRI 09/25/2023   Follow Up: Next week for PRP injection to proximal hamstring PAIN:  Are you  having pain? Yes: NPRS scale: 4-8/10 Pain location: R hip region Pain description: ache, sharp Aggravating factors: prolonged positioning(standing, sitting) Relieving factors: Heat, fig 4   PRECAUTIONS: None  RED FLAGS: None   WEIGHT BEARING RESTRICTIONS: No  FALLS:  Has patient fallen in last 6 months? No  OCCUPATION: retired  PLOF: Independent  PATIENT GOALS: To manage my pain  NEXT MD VISIT: Feb 2024  OBJECTIVE:   Note: Objective measures were completed at Evaluation unless otherwise noted.  DIAGNOSTIC FINDINGS: IMPRESSION: 1. Moderate osteoarthritis of the right hip. 2. Substantial asymmetric partial tearing and tendinopathy of the right hamstring tendon. 3. Degenerated anterior superior labrum.     Electronically Signed   By: Gaylyn Rong M.D.   On: 10/02/2023 07:53  PATIENT SURVEYS:  FOTO 47(58 predicted) ; 11/03/23 56%  MUSCLE LENGTH: Hamstrings: Right 80 deg; Left 80 deg; 11/03/23 90d B Thomas test: negative R  POSTURE: No Significant postural limitations  PALPATION: Palpable TrPs to R medial/lateral hamstrings, irritable on R only  LOWER EXTREMITY ROM: WNL B  Active ROM Right eval Left eval  Hip flexion    Hip extension    Hip abduction    Hip adduction    Hip internal rotation    Hip external rotation    Knee flexion    Knee extension    Ankle dorsiflexion    Ankle plantarflexion    Ankle inversion    Ankle eversion     (Blank rows = not tested)  LOWER EXTREMITY MMT:  MMT Right eval Left eval  Hip flexion    Hip extension    Hip abduction 4   Hip adduction    Hip internal rotation    Hip external rotation    Knee flexion 4   Knee extension    Ankle dorsiflexion    Ankle plantarflexion    Ankle inversion    Ankle eversion     (Blank rows = not tested)  LOWER EXTREMITY SPECIAL TESTS:  Hip special tests: Luisa Hart (FABER) test: negative, Trendelenburg test: negative, Thomas test: negative, and Hip scouring test: negative  FUNCTIONAL TESTS:  30 seconds chair stand test 10/19/23 13 reps arms crossed  GAIT: Distance walked: 74ftx2 Assistive device utilized: None Level of assistance: Complete Independence Comments: mild antalgic gait, decreases R step length                                                                                                                                TREATMENT DATE:  [facility] Adult PT Treatment:                                                 DATE: 11/05/23 Therapeutic Exercise: Nustep L6 8 min Neuromuscular re-ed: PKB 2# 15x2 eccentric emphasis PHE 2# 15x2 Bridge on ball(positioned at knees)  15x (positioned at heels) 15x RLE RTB kicks B 30s x2  Therapeutic Activity: Runner step 6 in 5# KB 15/15 Review of HEP provided by MD  [facility] Adult PT Treatment:                                                DATE: 11/03/23 Therapeutic Exercise: Nustep L6 8 min R hamstring stretch in sitting 30s x2  Therapeutic Activity: Single leg bridge R 15x2  Prone knee flexion R YTB 15x2 Golfers reach R 15x Runners step 6 in 15/15 with UE support 5# KB Assessment of R hamstring flexibility as well as eccentric strength testing to replicate symptoms  [facility] Adult PT Treatment:                                                DATE: 10/30/23 Therapeutic Exercise:  Nustep L6 8 min Manual Therapy:  Skilled palpation to identify taught and irritable bands in R adductor magnus recreating patient symptos as well as gauging depth of tissue involved Trigger Point Dry Needling  Subsequent Treatment: Instructions provided previously at initial dry needling treatment.   Patient Verbal Consent Given: Yes Education Handout Provided: Previously Provided Muscles Treated: R adductor magnus 30x30 needle size Electrical Stimulation Performed: No Treatment Response/Outcome: Less pain/symptom Neuromuscular re-ed: Cable walks 4 way 13# 5 reps each direction Runners step 4 in 10/10 with UE support 5# KB  [facility] Adult PT Treatment:                                                DATE: 10/26/23 Therapeutic Exercise: Nustep L5 8 min Manual Therapy: Skilled palpation to identify taught and irritable bands in R adductor magnus Trigger Point Dry Needling  Subsequent Treatment: Instructions provided previously at initial dry needling treatment.   Patient Verbal Consent Given: Yes Education Handout Provided: Yes Muscles Treated: R adductor  magnus Electrical Stimulation Performed: No Treatment Response/Outcome: less tenderness and soft tissue restriction post technique   Neuromuscular re-ed: Cable walks 4 way 10# 5 reps each direction Runners step 4 in 10/10 with UE support   [facility] Adult PT Treatment:                                                DATE: 10/21/23 Therapeutic Exercise: Nustep L4 6 min Supine hip fallouts RTB 2s 10x B, 10/10 unilaterally Bridge against RTB 2s 10x Bridge with ball 2s 10x Hip flexor stretch 30s R Hamstring stretch 30s R  Manual Therapy: Skilled palpation to identify taught and irritable bands in R medial hamstrings and adductors Trigger Point Dry Needling  Subsequent Treatment: Instructions provided previously at initial dry needling treatment.   Patient Verbal Consent Given: Yes Education Handout Provided: Previously Provided Muscles Treated: R semimembranosus/tendinosis, adductor magnus  Electrical Stimulation Performed: No Treatment Response/Outcome: less TTP, less tone    [facility] Adult PT Treatment:  DATE: 10/19/23 Therapeutic Exercise: Nustep L2 8 min Seated hamstring stretch R 30s x2 R hip flexor stretch 30s x2 Bridge 15x  Manual Therapy: Skilled palpation to identify taught and irritable bands in R semi membranosus/tendinosis Trigger Point Dry Needling  Initial Treatment: Pt instructed on Dry Needling rational, procedures, and possible side effects. Pt instructed to expect mild to moderate muscle soreness later in the day and/or into the next day.  Pt instructed in methods to reduce muscle soreness. Pt instructed to continue prescribed HEP. Patient was educated on signs and symptoms of infection and other risk factors and advised to seek medical attention should they occur.  Patient verbalized understanding of these instructions and education.   Patient Verbal Consent Given: Yes Education Handout Provided: Previously  Provided Muscles Treated: R semimembranosus and tendinosis Electrical Stimulation Performed: No Treatment Response/Outcome: multiple twitch responses, decreased tone and tenderness to palpation    10/14/23 Eval   PATIENT EDUCATION:  Education details: Discussed eval findings, rehab rationale and POC and patient is in agreement  Person educated: Patient Education method: Explanation Education comprehension: verbalized understanding and needs further education  HOME EXERCISE PROGRAM: Access Code: [email or web address] URL: [email or web address] Date: 10/21/2023 Prepared by: Gustavus Bryant  Exercises - Bridge  - 1 x daily - 5 x weekly - 2 sets - 10 reps - Hip Flexor Stretch at Edge of Bed  - 1 x daily - 5 x weekly - 1 sets - 2 reps - 30s hold - Seated Table Hamstring Stretch  - 1 x daily - 5 x weekly - 1 sets - 2 reps - 30s hold  ASSESSMENT:  CLINICAL IMPRESSION: Advanced to aggressive hamstring work with focus on eccentric component.  Added prone tasks for hip extension to stress R hamstring secondary function as a hip extender.  Added difficulty to current exercises to challenge balance and proprioception.  Patient is a 73 y.o. female who was seen today for physical therapy evaluation and treatment for R hip pain and weakness.  Patient presents with good hip mobility w/o marked flexibility deficits in hamstrings or hip flexors.  Mild myofascial R quad tightness noted.  Strength deficits noted in R hip flexion and hip abduction.  Most prominent finding was palpable TrPs in R hamstrings with biceps femoris being irritable.  Patient is a good candidate for OPPT and dry needling to resolve chronic symptoms  OBJECTIVE IMPAIRMENTS: Abnormal gait, decreased activity tolerance, decreased endurance, difficulty walking, decreased strength, increased fascial restrictions, and pain.   ACTIVITY LIMITATIONS: carrying, sitting, standing, squatting, stairs, and locomotion level  PERSONAL FACTORS:  Age, Past/current experiences, and Time since onset of injury/illness/exacerbation are also affecting patient's functional outcome.   REHAB POTENTIAL: Good  CLINICAL DECISION MAKING: Evolving/moderate complexity  EVALUATION COMPLEXITY: Low   GOALS: Goals reviewed with patient? No  SHORT TERM GOALS=LONG TERM GOALS: Target date: 11/14/23  Patient to demonstrate independence in HEP  Baseline: [email or web address] Goal status: INITIAL  2.  Patient will acknowledge 2/10 pain at least once during episode of care   Baseline: 4-8/10 Goal status: INITIAL  3.  Patient will score at least 58% on FOTO to signify clinically meaningful improvement in functional abilities.   Baseline: 47% Goal status: INITIAL  4.  Increase R knee flexion and R hip abduction strength to 4+/5 Baseline: 4/5 Goal status: INITIAL       PLAN:  PT FREQUENCY: 1-2x/week  PT DURATION: 6 weeks  PLANNED INTERVENTIONS: [id number]- PT Re-evaluation, 97110-Therapeutic exercises, 97530- Therapeutic activity, [id number]- Neuromuscular re-education, 97535- Self Care,  [id number]- Manual therapy, [id number]- Gait training, [id number]- Aquatic Therapy, and Dry Needling  PLAN FOR NEXT SESSION: HEP review and update, manual techniques as appropriate, aerobic tasks, ROM and flexibility activities, strengthening and PREs, TPDN, gait and balance training as needed     Hildred Laser, PT 11/05/2023, 3:03 PM

## 2023-11-05 ENCOUNTER — Ambulatory Visit: Payer: Medicare HMO

## 2023-11-05 DIAGNOSIS — M25551 Pain in right hip: Secondary | ICD-10-CM | POA: Diagnosis not present

## 2023-11-05 DIAGNOSIS — M6281 Muscle weakness (generalized): Secondary | ICD-10-CM | POA: Diagnosis not present

## 2023-11-09 NOTE — Therapy (Unsigned)
 OUTPATIENT PHYSICAL THERAPY TREATMENT NOTE   Patient Name: Jennifer Lutz MRN: [id number] DOB:Apr 02, 1951, 73 y.o., female Today's Date: 11/10/2023  END OF SESSION:  PT End of Session - 11/10/23 1308     Visit Number 8    Number of Visits 8    Date for PT Re-Evaluation 12/12/23    Authorization Type [employer]    PT Start Time 1305    PT Stop Time 1400    PT Time Calculation (min) 55 min    Activity Tolerance Patient tolerated treatment well    Behavior During Therapy [facility] for tasks assessed/performed                   Past Medical History:  Diagnosis Date   Chronic kidney disease    reduced kidney function   Complication of anesthesia    'takes very little.' 'Hard to wake up'   Degenerative disc disease, lumbar    Hyperlipidemia    no meds- diet controlled   Medical history non-contributory    Neuromuscular disorder (HCC)    Parkinson's disease   Past Surgical History:  Procedure Laterality Date   COLONOSCOPY  2008   FASCIAL DEFECT REPAIR Left 1983   left thigh   NASAL SEPTUM SURGERY  09/23/1983   WRIST ARTHROSCOPY WITH ULNA SHORTENING Left 06/07/2015   Procedure: ARTHROSCOPY LEFT WRIST DEBRIDEMENT ULNAR SHORTENING;  Surgeon: Cindee Salt, MD;  Location: [facility];  Service: Orthopedics;  Laterality: Left;   WRIST OSTEOTOMY Left 06/07/2015   Procedure: LEFT OSTEOTOMY;  Surgeon: Cindee Salt, MD;  Location: [facility];  Service: Orthopedics;  Laterality: Left;   Patient Active Problem List   Diagnosis Date Noted   Low back pain 07/11/2020   Nonallopathic lesion of thoracic region 07/11/2020   Nonallopathic lesion of lumbar region 07/11/2020   Nonallopathic lesion of sacral region 07/11/2020   Parkinson's disease (HCC) 01/05/2020   Borderline high cholesterol 09/07/2017   Routine general medical examination at a health care facility 12/26/2013   OSTEOPOROSIS, IDIOPATHIC 07/08/2007    PCP: Deeann Saint, MD   REFERRING  PROVIDER: Richardean Sale, DO  REFERRING DIAG: [phone] (ICD-10-CM) - Right hip pain M16.11 (ICD-10-CM) - Primary osteoarthritis of right hip S76.311A (ICD-10-CM) - Partial tear of right hamstring  THERAPY DIAG:  Pain in right hip  Muscle weakness (generalized)  Rationale for Evaluation and Treatment: Rehabilitation  ONSET DATE: chronic  SUBJECTIVE:   SUBJECTIVE STATEMENT:  Able to return to workout routine as well as the driving range w/o distinct pain.  Does note "discomfort" in R hip region with compressive and rotational forces.  PERTINENT HISTORY: 1. Right hip pain 2. Primary osteoarthritis of right hip 3. Partial tear of right hamstring  -Chronic with exacerbation, subsequent visit - Patient's symptoms are most consistent with a partial tearing of proximal right hamstring tendon as seen on MRI - Reviewed MRI from 09/25/2023 which showed partial tearing of right proximal hamstring tendon and tendinopathy, moderate osteoarthritis, anterior superior labral degeneration - Patient had essentially no relief from intra-articular hip CSI in the past - We discussed the risks of progressive tearing with CSI to proximal hamstring.  Patient elected to proceed with PRP injection to proximal hamstring tendon.  We will follow-up for procedure - Continue Tylenol for day-to-day pain relief.  May continue meloxicam 15 mg daily as needed for breakthrough pain.  Recommend limiting chronic NSAIDs to 1-2 doses per week - Continue HEP and start physical therapy.  Referral sent  Pertinent previous records reviewed include right hip MRI 09/25/2023   Follow Up: Next week for PRP injection to proximal hamstring PAIN:  Are you having pain? Yes: NPRS scale: 4-8/10 Pain location: R hip region Pain description: ache, sharp Aggravating factors: prolonged positioning(standing, sitting) Relieving factors: Heat, fig 4   PRECAUTIONS: None  RED FLAGS: None   WEIGHT BEARING RESTRICTIONS: No  FALLS:   Has patient fallen in last 6 months? No  OCCUPATION: retired  PLOF: Independent  PATIENT GOALS: To manage my pain  NEXT MD VISIT: Feb 2024  OBJECTIVE:  Note: Objective measures were completed at Evaluation unless otherwise noted.  DIAGNOSTIC FINDINGS: IMPRESSION: 1. Moderate osteoarthritis of the right hip. 2. Substantial asymmetric partial tearing and tendinopathy of the right hamstring tendon. 3. Degenerated anterior superior labrum.     Electronically Signed   By: Gaylyn Rong M.D.   On: 10/02/2023 07:53  PATIENT SURVEYS:  FOTO 47(58 predicted) ; 11/03/23 56%  MUSCLE LENGTH: Hamstrings: Right 80 deg; Left 80 deg; 11/03/23 90d B Thomas test: negative R  POSTURE: No Significant postural limitations  PALPATION: Palpable TrPs to R medial/lateral hamstrings, irritable on R only  LOWER EXTREMITY ROM: WNL B  Active ROM Right eval Left eval  Hip flexion    Hip extension    Hip abduction    Hip adduction    Hip internal rotation    Hip external rotation    Knee flexion    Knee extension    Ankle dorsiflexion    Ankle plantarflexion    Ankle inversion    Ankle eversion     (Blank rows = not tested)  LOWER EXTREMITY MMT:  MMT Right eval Left eval  Hip flexion    Hip extension    Hip abduction 4   Hip adduction    Hip internal rotation    Hip external rotation    Knee flexion 4   Knee extension    Ankle dorsiflexion    Ankle plantarflexion    Ankle inversion    Ankle eversion     (Blank rows = not tested)  LOWER EXTREMITY SPECIAL TESTS:  Hip special tests: Luisa Hart (FABER) test: negative, Trendelenburg test: negative, Thomas test: negative, and Hip scouring test: negative LOWER EXTREMITY SPECIAL TESTS:  Hip special tests: Luisa Hart (FABER) test: negative, Thomas test: negative, Ober's test: negative, Hip scouring test: negative, Anterior hip impingement test: negative, and Piriformis test: negative   FUNCTIONAL TESTS:  30 seconds chair stand  test 10/19/23 13 reps arms crossed  GAIT: Distance walked: 73ftx2 Assistive device utilized: None Level of assistance: Complete Independence Comments: mild antalgic gait, decreases R step length                                                                                                                                TREATMENT DATE:  [facility] Adult PT Treatment:  DATE: 11/10/23 Therapeutic Exercise: Nustep L6 8 min Fig 4 piriformis stretch B 30s x2 Neuromuscular re-ed: Cable walks 17# 4 way 5 reps Bridge on ball (positioned at heels) 15x2 RLE GTB kicks B 30s x2  Therapeutic Activity: Runner step 6 in 5# KB 15/15 PKB 3# 15x2 eccentric emphasis PHE 3# 15x2 Self Care: Assessment of R hip mobility, joint integrity and ITB flexibility w/o significant restrictions noted.  No distinct TTP at R hamstring group or Ischial tuberosity.  See special test section.   [facility] Adult PT Treatment:                                                DATE: 11/05/23 Therapeutic Exercise: Nustep L6 8 min Neuromuscular re-ed: PKB 2# 15x2 eccentric emphasis PHE 2# 15x2 Bridge on ball(positioned at knees) 15x (positioned at heels) 15x RLE RTB kicks B 30s x2  Therapeutic Activity: Runner step 6 in 5# KB 15/15 Review of HEP provided by MD  [facility] Adult PT Treatment:                                                DATE: 11/03/23 Therapeutic Exercise: Nustep L6 8 min R hamstring stretch in sitting 30s x2  Therapeutic Activity: Single leg bridge R 15x2  Prone knee flexion R YTB 15x2 Golfers reach R 15x Runners step 6 in 15/15 with UE support 5# KB Assessment of R hamstring flexibility as well as eccentric strength testing to replicate symptoms  [facility] Adult PT Treatment:                                                DATE: 10/30/23 Therapeutic Exercise:  Nustep L6 8 min Manual Therapy:  Skilled palpation to identify taught and irritable bands in R adductor magnus  recreating patient symptos as well as gauging depth of tissue involved Trigger Point Dry Needling  Subsequent Treatment: Instructions provided previously at initial dry needling treatment.   Patient Verbal Consent Given: Yes Education Handout Provided: Previously Provided Muscles Treated: R adductor magnus 30x30 needle size Electrical Stimulation Performed: No Treatment Response/Outcome: Less pain/symptom Neuromuscular re-ed: Cable walks 4 way 13# 5 reps each direction Runners step 4 in 10/10 with UE support 5# KB  [facility] Adult PT Treatment:                                                DATE: 10/26/23 Therapeutic Exercise: Nustep L5 8 min Manual Therapy: Skilled palpation to identify taught and irritable bands in R adductor magnus Trigger Point Dry Needling  Subsequent Treatment: Instructions provided previously at initial dry needling treatment.   Patient Verbal Consent Given: Yes Education Handout Provided: Yes Muscles Treated: R adductor magnus Electrical Stimulation Performed: No Treatment Response/Outcome: less tenderness and soft tissue restriction post technique   Neuromuscular re-ed: Cable walks 4 way 10# 5 reps each direction Runners step 4 in 10/10 with UE support   [facility] Adult PT Treatment:  DATE: 10/21/23 Therapeutic Exercise: Nustep L4 6 min Supine hip fallouts RTB 2s 10x B, 10/10 unilaterally Bridge against RTB 2s 10x Bridge with ball 2s 10x Hip flexor stretch 30s R Hamstring stretch 30s R  Manual Therapy: Skilled palpation to identify taught and irritable bands in R medial hamstrings and adductors Trigger Point Dry Needling  Subsequent Treatment: Instructions provided previously at initial dry needling treatment.   Patient Verbal Consent Given: Yes Education Handout Provided: Previously Provided Muscles Treated: R semimembranosus/tendinosis, adductor magnus  Electrical Stimulation Performed: No Treatment  Response/Outcome: less TTP, less tone    [facility] Adult PT Treatment:                                                DATE: 10/19/23 Therapeutic Exercise: Nustep L2 8 min Seated hamstring stretch R 30s x2 R hip flexor stretch 30s x2 Bridge 15x  Manual Therapy: Skilled palpation to identify taught and irritable bands in R semi membranosus/tendinosis Trigger Point Dry Needling  Initial Treatment: Pt instructed on Dry Needling rational, procedures, and possible side effects. Pt instructed to expect mild to moderate muscle soreness later in the day and/or into the next day.  Pt instructed in methods to reduce muscle soreness. Pt instructed to continue prescribed HEP. Patient was educated on signs and symptoms of infection and other risk factors and advised to seek medical attention should they occur.  Patient verbalized understanding of these instructions and education.   Patient Verbal Consent Given: Yes Education Handout Provided: Previously Provided Muscles Treated: R semimembranosus and tendinosis Electrical Stimulation Performed: No Treatment Response/Outcome: multiple twitch responses, decreased tone and tenderness to palpation    10/14/23 Eval   PATIENT EDUCATION:  Education details: Discussed eval findings, rehab rationale and POC and patient is in agreement  Person educated: Patient Education method: Explanation Education comprehension: verbalized understanding and needs further education  HOME EXERCISE PROGRAM: Access Code: [email or web address] URL: [email or web address] Date: 10/21/2023 Prepared by: Gustavus Bryant  Exercises - Bridge  - 1 x daily - 5 x weekly - 2 sets - 10 reps - Hip Flexor Stretch at Edge of Bed  - 1 x daily - 5 x weekly - 1 sets - 2 reps - 30s hold - Seated Table Hamstring Stretch  - 1 x daily - 5 x weekly - 1 sets - 2 reps - 30s hold  ASSESSMENT:  CLINICAL IMPRESSION: Increased weight and resistance as noted.  Continued to stress R hamstring and  posterior hip musculature w/o any symptoms reported.  Addressed concerns regarding return to full activity by assessment of R hip joint integrity, soft tissue flexibility and strength.  Patient is a 73 y.o. female who was seen today for physical therapy evaluation and treatment for R hip pain and weakness.  Patient presents with good hip mobility w/o marked flexibility deficits in hamstrings or hip flexors.  Mild myofascial R quad tightness noted.  Strength deficits noted in R hip flexion and hip abduction.  Most prominent finding was palpable TrPs in R hamstrings with biceps femoris being irritable.  Patient is a good candidate for OPPT and dry needling to resolve chronic symptoms  OBJECTIVE IMPAIRMENTS: Abnormal gait, decreased activity tolerance, decreased endurance, difficulty walking, decreased strength, increased fascial restrictions, and pain.   ACTIVITY LIMITATIONS: carrying, sitting, standing, squatting, stairs, and locomotion level  PERSONAL FACTORS: Age, Past/current experiences, and Time since onset  of injury/illness/exacerbation are also affecting patient's functional outcome.   REHAB POTENTIAL: Good  CLINICAL DECISION MAKING: Evolving/moderate complexity  EVALUATION COMPLEXITY: Low   GOALS: Goals reviewed with patient? No  SHORT TERM GOALS=LONG TERM GOALS: Target date: 11/14/23  Patient to demonstrate independence in HEP  Baseline: [email or web address] Goal status: INITIAL  2.  Patient will acknowledge 2/10 pain at least once during episode of care   Baseline: 4-8/10 Goal status: INITIAL  3.  Patient will score at least 58% on FOTO to signify clinically meaningful improvement in functional abilities.   Baseline: 47% Goal status: INITIAL  4.  Increase R knee flexion and R hip abduction strength to 4+/5 Baseline: 4/5 Goal status: INITIAL       PLAN:  PT FREQUENCY: 1-2x/week  PT DURATION: 6 weeks  PLANNED INTERVENTIONS: 97164- PT Re-evaluation, 97110-Therapeutic  exercises, 97530- Therapeutic activity, [id number]- Neuromuscular re-education, 97535- Self Care, [id number]- Manual therapy, [id number]- Gait training, [phone]- Aquatic Therapy, and Dry Needling  PLAN FOR NEXT SESSION: HEP review and update, manual techniques as appropriate, aerobic tasks, ROM and flexibility activities, strengthening and PREs, TPDN, gait and balance training as needed     Hildred Laser, PT 11/10/2023, 2:33 PM

## 2023-11-10 ENCOUNTER — Ambulatory Visit: Payer: Medicare HMO

## 2023-11-10 DIAGNOSIS — M25551 Pain in right hip: Secondary | ICD-10-CM

## 2023-11-10 DIAGNOSIS — M6281 Muscle weakness (generalized): Secondary | ICD-10-CM

## 2023-11-11 NOTE — Therapy (Unsigned)
 OUTPATIENT PHYSICAL THERAPY TREATMENT NOTE/DISCHARGE SUMMARY   Patient Name: Jennifer Lutz MRN: [id number] DOB:11/26/1950, 73 y.o., female Today's Date: 11/13/2023  END OF SESSION:  PT End of Session - 11/13/23 1206     Visit Number 9    Number of Visits 9    Date for PT Re-Evaluation 12/12/23    Authorization Type [employer]    PT Start Time 1210    PT Stop Time 1255    PT Time Calculation (min) 45 min    Activity Tolerance Patient tolerated treatment well    Behavior During Therapy [facility] for tasks assessed/performed                    Past Medical History:  Diagnosis Date   Chronic kidney disease    reduced kidney function   Complication of anesthesia    'takes very little.' 'Hard to wake up'   Degenerative disc disease, lumbar    Hyperlipidemia    no meds- diet controlled   Medical history non-contributory    Neuromuscular disorder (HCC)    Parkinson's disease   Past Surgical History:  Procedure Laterality Date   COLONOSCOPY  2008   FASCIAL DEFECT REPAIR Left 1983   left thigh   NASAL SEPTUM SURGERY  09/23/1983   WRIST ARTHROSCOPY WITH ULNA SHORTENING Left 06/07/2015   Procedure: ARTHROSCOPY LEFT WRIST DEBRIDEMENT ULNAR SHORTENING;  Surgeon: Cindee Salt, MD;  Location: [facility];  Service: Orthopedics;  Laterality: Left;   WRIST OSTEOTOMY Left 06/07/2015   Procedure: LEFT OSTEOTOMY;  Surgeon: Cindee Salt, MD;  Location: [facility];  Service: Orthopedics;  Laterality: Left;   Patient Active Problem List   Diagnosis Date Noted   Low back pain 07/11/2020   Nonallopathic lesion of thoracic region 07/11/2020   Nonallopathic lesion of lumbar region 07/11/2020   Nonallopathic lesion of sacral region 07/11/2020   Parkinson's disease (HCC) 01/05/2020   Borderline high cholesterol 09/07/2017   Routine general medical examination at a health care facility 12/26/2013   OSTEOPOROSIS, IDIOPATHIC 07/08/2007    PCP: Deeann Saint, MD   REFERRING PROVIDER: Richardean Sale, DO  REFERRING DIAG: [phone] (ICD-10-CM) - Right hip pain M16.11 (ICD-10-CM) - Primary osteoarthritis of right hip S76.311A (ICD-10-CM) - Partial tear of right hamstring  THERAPY DIAG:  Pain in right hip  Muscle weakness (generalized)  Rationale for Evaluation and Treatment: Rehabilitation  ONSET DATE: chronic  SUBJECTIVE:   SUBJECTIVE STATEMENT:  Arrives to PT w/o any symptoms.  No adverse effects to report after last session.  PERTINENT HISTORY: 1. Right hip pain 2. Primary osteoarthritis of right hip 3. Partial tear of right hamstring  -Chronic with exacerbation, subsequent visit - Patient's symptoms are most consistent with a partial tearing of proximal right hamstring tendon as seen on MRI - Reviewed MRI from 09/25/2023 which showed partial tearing of right proximal hamstring tendon and tendinopathy, moderate osteoarthritis, anterior superior labral degeneration - Patient had essentially no relief from intra-articular hip CSI in the past - We discussed the risks of progressive tearing with CSI to proximal hamstring.  Patient elected to proceed with PRP injection to proximal hamstring tendon.  We will follow-up for procedure - Continue Tylenol for day-to-day pain relief.  May continue meloxicam 15 mg daily as needed for breakthrough pain.  Recommend limiting chronic NSAIDs to 1-2 doses per week - Continue HEP and start physical therapy.  Referral sent     Pertinent previous records reviewed include right hip MRI  09/25/2023   Follow Up: Next week for PRP injection to proximal hamstring PAIN:  Are you having pain? Yes: NPRS scale: 4-8/10 Pain location: R hip region Pain description: ache, sharp Aggravating factors: prolonged positioning(standing, sitting) Relieving factors: Heat, fig 4   PRECAUTIONS: None  RED FLAGS: None   WEIGHT BEARING RESTRICTIONS: No  FALLS:  Has patient fallen in last 6 months? No  OCCUPATION:  retired  PLOF: Independent  PATIENT GOALS: To manage my pain  NEXT MD VISIT: Feb 2024  OBJECTIVE:  Note: Objective measures were completed at Evaluation unless otherwise noted.  DIAGNOSTIC FINDINGS: IMPRESSION: 1. Moderate osteoarthritis of the right hip. 2. Substantial asymmetric partial tearing and tendinopathy of the right hamstring tendon. 3. Degenerated anterior superior labrum.     Electronically Signed   By: Gaylyn Rong M.D.   On: 10/02/2023 07:53  PATIENT SURVEYS:  FOTO 47(58 predicted) ; 11/03/23 56%; 11/13/23 72%  MUSCLE LENGTH: Hamstrings: Right 80 deg; Left 80 deg; 11/03/23 90d B Thomas test: negative R  POSTURE: No Significant postural limitations  PALPATION: Palpable TrPs to R medial/lateral hamstrings, irritable on R only  LOWER EXTREMITY ROM: WNL B  Active ROM Right eval Left eval  Hip flexion    Hip extension    Hip abduction    Hip adduction    Hip internal rotation    Hip external rotation    Knee flexion    Knee extension    Ankle dorsiflexion    Ankle plantarflexion    Ankle inversion    Ankle eversion     (Blank rows = not tested)  LOWER EXTREMITY MMT:  MMT Right eval Left eval R 11/13/23  Hip flexion     Hip extension     Hip abduction 4  4+  Hip adduction     Hip internal rotation     Hip external rotation     Knee flexion 4  4+  Knee extension     Ankle dorsiflexion     Ankle plantarflexion     Ankle inversion     Ankle eversion      (Blank rows = not tested)  LOWER EXTREMITY SPECIAL TESTS:  Hip special tests: Luisa Hart (FABER) test: negative, Trendelenburg test: negative, Thomas test: negative, and Hip scouring test: negative LOWER EXTREMITY SPECIAL TESTS:  Hip special tests: Luisa Hart (FABER) test: negative, Thomas test: negative, Ober's test: negative, Hip scouring test: negative, Anterior hip impingement test: negative, and Piriformis test: negative   FUNCTIONAL TESTS:  30 seconds chair stand test 10/19/23 13  reps arms crossed  GAIT: Distance walked: 32ftx2 Assistive device utilized: None Level of assistance: Complete Independence Comments: mild antalgic gait, decreases R step length                                                                                                                                TREATMENT DATE:  [facility] Adult PT Treatment:  DATE: 11/13/23 Therapeutic Exercise: Nustep L6 8 min Fig 4 piriformis stretch B 30s x2 Neuromuscular re-ed: Single leg bridge over ball (heels) 15x Bridge on ball (positioned at heels) 15x Single leg bridge on disk 15x R Cable walks 17# 4 way 5 reps Therapeutic Activity: Runner step 10 in 5# KB 15/15 PKB 4# 15x2 eccentric emphasis PHE 4# 15x2 Prone on elbows 2 min Prone press 10x Lunge to bolster 5/5 w/UE support Self Care: Additional minutes spent for educating on updated Therapeutic Home Exercise Program as well as comparing current status to condition at start of care. This included exercises focusing on stretching, strengthening, with focus on eccentric aspects. Long term goals include an improvement in range of motion, strength, endurance as well as avoiding reinjury. Patient's frequency would include in 1-2 times a day, 3-5 times a week for a duration of 6-12 weeks. Proper technique shown and discussed handout in great detail. All questions were discussed and addressed.     [facility] Adult PT Treatment:                                                DATE: 11/10/23 Therapeutic Exercise: Nustep L6 8 min Fig 4 piriformis stretch B 30s x2 Neuromuscular re-ed: Cable walks 17# 4 way 5 reps Bridge on ball (positioned at heels) 15x2 RLE GTB kicks B 30s x2  Therapeutic Activity: Runner step 6 in 5# KB 15/15 PKB 3# 15x2 eccentric emphasis PHE 3# 15x2 Self Care: Assessment of R hip mobility, joint integrity and ITB flexibility w/o significant restrictions noted.  No distinct TTP at R hamstring  group or Ischial tuberosity.  See special test section.   [facility] Adult PT Treatment:                                                DATE: 11/05/23 Therapeutic Exercise: Nustep L6 8 min Neuromuscular re-ed: PKB 2# 15x2 eccentric emphasis PHE 2# 15x2 Bridge on ball(positioned at knees) 15x (positioned at heels) 15x RLE RTB kicks B 30s x2  Therapeutic Activity: Runner step 6 in 5# KB 15/15 Review of HEP provided by MD  [facility] Adult PT Treatment:                                                DATE: 11/03/23 Therapeutic Exercise: Nustep L6 8 min R hamstring stretch in sitting 30s x2  Therapeutic Activity: Single leg bridge R 15x2  Prone knee flexion R YTB 15x2 Golfers reach R 15x Runners step 6 in 15/15 with UE support 5# KB Assessment of R hamstring flexibility as well as eccentric strength testing to replicate symptoms  [facility] Adult PT Treatment:                                                DATE: 10/30/23 Therapeutic Exercise:  Nustep L6 8 min Manual Therapy:  Skilled palpation to identify taught and irritable bands in R adductor magnus recreating patient symptos as  well as gauging depth of tissue involved Trigger Point Dry Needling  Subsequent Treatment: Instructions provided previously at initial dry needling treatment.   Patient Verbal Consent Given: Yes Education Handout Provided: Previously Provided Muscles Treated: R adductor magnus 30x30 needle size Electrical Stimulation Performed: No Treatment Response/Outcome: Less pain/symptom Neuromuscular re-ed: Cable walks 4 way 13# 5 reps each direction Runners step 4 in 10/10 with UE support 5# KB  [facility] Adult PT Treatment:                                                DATE: 10/26/23 Therapeutic Exercise: Nustep L5 8 min Manual Therapy: Skilled palpation to identify taught and irritable bands in R adductor magnus Trigger Point Dry Needling  Subsequent Treatment: Instructions provided previously at initial dry needling treatment.    Patient Verbal Consent Given: Yes Education Handout Provided: Yes Muscles Treated: R adductor magnus Electrical Stimulation Performed: No Treatment Response/Outcome: less tenderness and soft tissue restriction post technique   Neuromuscular re-ed: Cable walks 4 way 10# 5 reps each direction Runners step 4 in 10/10 with UE support   [facility] Adult PT Treatment:                                                DATE: 10/21/23 Therapeutic Exercise: Nustep L4 6 min Supine hip fallouts RTB 2s 10x B, 10/10 unilaterally Bridge against RTB 2s 10x Bridge with ball 2s 10x Hip flexor stretch 30s R Hamstring stretch 30s R  Manual Therapy: Skilled palpation to identify taught and irritable bands in R medial hamstrings and adductors Trigger Point Dry Needling  Subsequent Treatment: Instructions provided previously at initial dry needling treatment.   Patient Verbal Consent Given: Yes Education Handout Provided: Previously Provided Muscles Treated: R semimembranosus/tendinosis, adductor magnus  Electrical Stimulation Performed: No Treatment Response/Outcome: less TTP, less tone    [facility] Adult PT Treatment:                                                DATE: 10/19/23 Therapeutic Exercise: Nustep L2 8 min Seated hamstring stretch R 30s x2 R hip flexor stretch 30s x2 Bridge 15x  Manual Therapy: Skilled palpation to identify taught and irritable bands in R semi membranosus/tendinosis Trigger Point Dry Needling  Initial Treatment: Pt instructed on Dry Needling rational, procedures, and possible side effects. Pt instructed to expect mild to moderate muscle soreness later in the day and/or into the next day.  Pt instructed in methods to reduce muscle soreness. Pt instructed to continue prescribed HEP. Patient was educated on signs and symptoms of infection and other risk factors and advised to seek medical attention should they occur.  Patient verbalized understanding of these instructions and  education.   Patient Verbal Consent Given: Yes Education Handout Provided: Previously Provided Muscles Treated: R semimembranosus and tendinosis Electrical Stimulation Performed: No Treatment Response/Outcome: multiple twitch responses, decreased tone and tenderness to palpation    10/14/23 Eval   PATIENT EDUCATION:  Education details: Discussed eval findings, rehab rationale and POC and patient is in agreement  Person educated: Patient Education method: Explanation  Education comprehension: verbalized understanding and needs further education  HOME EXERCISE PROGRAM: Access Code: [email or web address] URL: [email or web address] Date: 11/13/2023 Prepared by: Gustavus Bryant  Exercises - Supine Figure 4 Piriformis Stretch  - 1-2 x daily - 5 x weekly - 1 sets - 2 reps - 30s hold - Prone Press Up  - 1-2 x daily - 5 x weekly - 2 sets - 10 reps - Standard Lunge  - 1-2 x daily - 5 x weekly - 1 sets - 10 reps  ASSESSMENT:  CLINICAL IMPRESSION: Rehab goals met.  Patient has returned to a high level of function w/o exacerbation of symptoms.  Continued to stress R hip extension tasks w/o setback.  HEP updated to reflect progress and current deficits.  Patient is a 73 y.o. female who was seen today for physical therapy evaluation and treatment for R hip pain and weakness.  Patient presents with good hip mobility w/o marked flexibility deficits in hamstrings or hip flexors.  Mild myofascial R quad tightness noted.  Strength deficits noted in R hip flexion and hip abduction.  Most prominent finding was palpable TrPs in R hamstrings with biceps femoris being irritable.  Patient is a good candidate for OPPT and dry needling to resolve chronic symptoms  OBJECTIVE IMPAIRMENTS: Abnormal gait, decreased activity tolerance, decreased endurance, difficulty walking, decreased strength, increased fascial restrictions, and pain.   ACTIVITY LIMITATIONS: carrying, sitting, standing, squatting, stairs, and  locomotion level  PERSONAL FACTORS: Age, Past/current experiences, and Time since onset of injury/illness/exacerbation are also affecting patient's functional outcome.   REHAB POTENTIAL: Good  CLINICAL DECISION MAKING: Evolving/moderate complexity  EVALUATION COMPLEXITY: Low   GOALS: Goals reviewed with patient? No  SHORT TERM GOALS=LONG TERM GOALS: Target date: 11/14/23  Patient to demonstrate independence in HEP  Baseline: [email or web address] Goal status: Met  2.  Patient will acknowledge 2/10 pain at least once during episode of care   Baseline: 4-8/10; 11/13/23 0/10 Goal status: Met  3.  Patient will score at least 58% on FOTO to signify clinically meaningful improvement in functional abilities.   Baseline: 47%; 11/13/23 72% Goal status: Met  4.  Increase R knee flexion and R hip abduction strength to 4+/5 Baseline: 4/5 Goal status: Met       PLAN:  PT FREQUENCY: 1-2x/week  PT DURATION: 6 weeks  PLANNED INTERVENTIONS: 97164- PT Re-evaluation, 97110-Therapeutic exercises, 97530- Therapeutic activity, [id number]- Neuromuscular re-education, 97535- Self Care, 42595- Manual therapy, [id number]- Gait training, [id number]- Aquatic Therapy, and Dry Needling  PLAN FOR NEXT SESSION: HEP review and update, manual techniques as appropriate, aerobic tasks, ROM and flexibility activities, strengthening and PREs, TPDN, gait and balance training as needed     Hildred Laser, PT 11/13/2023, 1:21 PM

## 2023-11-13 ENCOUNTER — Ambulatory Visit: Payer: Medicare HMO

## 2023-11-13 DIAGNOSIS — M25551 Pain in right hip: Secondary | ICD-10-CM | POA: Diagnosis not present

## 2023-11-13 DIAGNOSIS — M6281 Muscle weakness (generalized): Secondary | ICD-10-CM

## 2023-11-16 NOTE — Progress Notes (Unsigned)
    Aleen Sells D.[name] [address] [place] [address] Phone: [phone]   Assessment and Plan:     There are no diagnoses linked to this encounter.  ***   Pertinent previous records reviewed include ***    Follow Up: ***     Subjective:   I, Jennifer Lutz, am serving as a Neurosurgeon for Doctor Richardean Sale   Chief Complaint: low back pain    HPI:  09/03/2022 Jennifer Lutz is a 74 y.o. female coming in with complaint of R hip pain. Last seen in September 2022 for OMT. Patient has been seeing Dr. Jean Rosenthal. Last week her pain increased. Radiates down the leg to the knee. 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 Believes this motion may have exacerbated her pain.    08/04/23 Patient is a 73 year old female with concerns of low back pain. Patient states that she is in piriformis flare. She had to take 2 advil    08/25/2023 Patient states she is the same . She has been miserable. Tylenol is not helping    09/08/2023 Patient states things have improved a little but she is not able to do her ADLS she is in to much pain still    10/02/2023 Patient states that she is feeling lousy. Mornings are okay but as the day goes on the pain increases    10/09/2023 Patient states  11/17/2023 Patient states  Relevant Historical Information: Parkinson disease, osteoporosis  Additional pertinent review of systems negative.   Current Outpatient Medications:    aspirin 81 MG tablet, Take 81 mg by mouth daily., Disp: , Rfl:    carbidopa-levodopa (SINEMET IR) 25-100 MG tablet, TAKE 1 TABLET BY MOUTH THREE TIMES DAILY AT  7AM,  11AM,  AND  4PM, Disp: 270 tablet, Rfl: 0   LORazepam (ATIVAN) 0.5 MG tablet, 1-2 tabs 30 - 60 min prior to MRI. Do not drive with this medicine., Disp: 4 tablet, Rfl: 0   magnesium 30 MG tablet, Take 30 mg by mouth daily., Disp: , Rfl:     meloxicam (MOBIC) 15 MG tablet, Take 1 tablet (15 mg total) by mouth daily., Disp: 14 tablet, Rfl: 0   Omega-3 Fatty Acids (OMEGA-3 FISH OIL) 500 MG CAPS, Take 50 mg by mouth., Disp: , Rfl:    OVER THE COUNTER MEDICATION, Calcium with D3 500 mg, Disp: , Rfl:    rosuvastatin (CRESTOR) 5 MG tablet, Take 1 tab (5 mg) 3 times per week., Disp: 30 tablet, Rfl: 3   Objective:     There were no vitals filed for this visit.    There is no height or weight on file to calculate BMI.    Physical Exam:    ***   Electronically signed by:  Aleen Sells D.[name] 12:26 PM 11/16/23

## 2023-11-17 ENCOUNTER — Ambulatory Visit (INDEPENDENT_AMBULATORY_CARE_PROVIDER_SITE_OTHER): Payer: Medicare HMO | Admitting: Sports Medicine

## 2023-11-17 VITALS — BP 108/78 | HR 74 | Ht 59.0 in | Wt 96.0 lb

## 2023-11-17 DIAGNOSIS — S76311A Strain of muscle, fascia and tendon of the posterior muscle group at thigh level, right thigh, initial encounter: Secondary | ICD-10-CM | POA: Diagnosis not present

## 2023-11-17 DIAGNOSIS — M1611 Unilateral primary osteoarthritis, right hip: Secondary | ICD-10-CM | POA: Diagnosis not present

## 2023-11-17 DIAGNOSIS — M25551 Pain in right hip: Secondary | ICD-10-CM

## 2023-11-17 NOTE — Patient Instructions (Signed)
 Continue tylenol for day to day pain relief With NSAID for breakthrough pain  Continue HEP and PT  As needed follow up

## 2023-11-18 ENCOUNTER — Ambulatory Visit: Payer: Medicare HMO | Admitting: Sports Medicine

## 2023-12-15 DIAGNOSIS — Z1283 Encounter for screening for malignant neoplasm of skin: Secondary | ICD-10-CM | POA: Diagnosis not present

## 2023-12-15 DIAGNOSIS — L820 Inflamed seborrheic keratosis: Secondary | ICD-10-CM | POA: Diagnosis not present

## 2023-12-15 DIAGNOSIS — D225 Melanocytic nevi of trunk: Secondary | ICD-10-CM | POA: Diagnosis not present

## 2023-12-15 DIAGNOSIS — L818 Other specified disorders of pigmentation: Secondary | ICD-10-CM | POA: Diagnosis not present

## 2023-12-15 DIAGNOSIS — X32XXXD Exposure to sunlight, subsequent encounter: Secondary | ICD-10-CM | POA: Diagnosis not present

## 2023-12-15 DIAGNOSIS — L814 Other melanin hyperpigmentation: Secondary | ICD-10-CM | POA: Diagnosis not present

## 2023-12-15 DIAGNOSIS — L570 Actinic keratosis: Secondary | ICD-10-CM | POA: Diagnosis not present

## 2024-01-10 ENCOUNTER — Other Ambulatory Visit: Payer: Self-pay | Admitting: Neurology

## 2024-01-10 DIAGNOSIS — G20A1 Parkinson's disease without dyskinesia, without mention of fluctuations: Secondary | ICD-10-CM

## 2024-01-14 ENCOUNTER — Other Ambulatory Visit: Payer: Self-pay | Admitting: Family Medicine

## 2024-01-14 DIAGNOSIS — E782 Mixed hyperlipidemia: Secondary | ICD-10-CM

## 2024-01-26 NOTE — Progress Notes (Signed)
 Assessment/Plan:   1.  Parkinsons Disease  -Continue carbidopa /levodopa  25/100, 1 tablet 3 times per day.    -She is following with Dr. Wauneta Haddock  -she looks well!  -We discussed that it used to be thought that levodopa  would increase risk of melanoma but now it is believed that Parkinsons itself likely increases risk of melanoma. she is to get regular skin checks.    2.  Hx of Sciatica/low back pain  -Following with Dr. Felipe Horton and Dr. Cleora Daft.  Treatment with [facility] in past   3.  Chronic right shoulder pain due to rotator cuff pathology  -Following with sports medicine, Dr. Cleora Daft  -Has found injections beneficial  4.  Hip pain  -working with Dr. Cleora Daft, s/p PRP and PT with dry needling Subjective:   Jennifer Lutz was seen today in follow up for Parkinsons disease.  My previous records were reviewed prior to todays visit as well as outside records available to me.  Overall, she is doing fairly well.  She has been following with Dr. Cleora Daft for right hip pain and has been in physical therapy.  Notes are reviewed.  She did PRP and PT for dry needling and that helped.  Last derm appt in March.    Current prescribed movement disorder medications: Carbidopa /levodopa  25/100, 1 tablet 3 times per day   ALLERGIES:  No Known Allergies  CURRENT MEDICATIONS:  Outpatient Encounter Medications as of 02/02/2024  Medication Sig   aspirin 81 MG tablet Take 81 mg by mouth daily.   carbidopa -levodopa  (SINEMET  IR) 25-100 MG tablet TAKE 1 TABLET BY MOUTH THREE TIMES DAILY AT  7AM,  11AM,  AND 4PM.   LORazepam  (ATIVAN ) 0.5 MG tablet 1-2 tabs 30 - 60 min prior to MRI. Do not drive with this medicine.   magnesium 30 MG tablet Take 30 mg by mouth daily.   meloxicam  (MOBIC ) 15 MG tablet Take 1 tablet (15 mg total) by mouth daily.   Omega-3 Fatty Acids (OMEGA-3 FISH OIL) 500 MG CAPS Take 50 mg by mouth.   OVER THE COUNTER MEDICATION Calcium  with D3 500 mg   rosuvastatin  (CRESTOR ) 5 MG  tablet TAKE 1 TABLET BY MOUTH THREE TIMES A WEEK   No facility-administered encounter medications on file as of 02/02/2024.    Objective:   PHYSICAL EXAMINATION:    VITALS:   Vitals:   02/02/24 0817  BP: (!) 142/80  Pulse: 69  SpO2: 98%  Weight: 94 lb 9.6 oz (42.9 kg)  Height: 4\' 11"  (1.499 m)       GEN:  The patient appears stated age and is in NAD.   HEENT:  Normocephalic, atraumatic.  The mucous membranes are moist.  CV:  RRR Lungs:  CTAB  Neurological examination:  Orientation: The patient is alert and oriented x3. Cranial nerves: There is good facial symmetry with facial hypomimia. The speech is fluent and clear. Soft palate rises symmetrically and there is no tongue deviation. Hearing is intact to conversational tone. Sensation: Sensation is intact to light touch throughout Motor: Strength is at least antigravity x4.  Movement examination: Tone: There is nl tone in the UE/LE Abnormal movements: there is no tremor today Coordination:  There is no decremation, with any form of RAMS, including alternating supination and pronation of the forearm, hand opening and closing, finger taps, heel taps and toe taps. Gait and Station: The patient ambulates well in the hall with good arm swing.    I have reviewed and interpreted  the following labs independently    Chemistry      Component Value Date/Time   NA 142 04/03/2023 0857   K 4.5 04/03/2023 0857   CL 103 04/03/2023 0857   CO2 30 04/03/2023 0857   BUN 16 04/03/2023 0857   CREATININE 1.00 04/03/2023 0857      Component Value Date/Time   CALCIUM  9.7 04/03/2023 0857   ALKPHOS 65 04/03/2023 0857   AST 21 04/03/2023 0857   ALT 4 04/03/2023 0857   BILITOT 0.6 04/03/2023 0857       Lab Results  Component Value Date   WBC 7.0 04/03/2023   HGB 14.4 04/03/2023   HCT 43.0 04/03/2023   MCV 100.3 (H) 04/03/2023   PLT 251.0 04/03/2023    Lab Results  Component Value Date   TSH 1.42 04/03/2023    Cc:  Viola Greulich, MD

## 2024-02-02 ENCOUNTER — Ambulatory Visit: Payer: Medicare HMO | Admitting: Neurology

## 2024-02-02 ENCOUNTER — Encounter: Payer: Self-pay | Admitting: Neurology

## 2024-02-02 VITALS — BP 142/80 | HR 69 | Ht 59.0 in | Wt 94.6 lb

## 2024-02-02 DIAGNOSIS — G20A1 Parkinson's disease without dyskinesia, without mention of fluctuations: Secondary | ICD-10-CM | POA: Diagnosis not present

## 2024-02-02 NOTE — Patient Instructions (Signed)

## 2024-03-04 ENCOUNTER — Other Ambulatory Visit: Payer: Self-pay | Admitting: Family Medicine

## 2024-03-04 DIAGNOSIS — Z1231 Encounter for screening mammogram for malignant neoplasm of breast: Secondary | ICD-10-CM

## 2024-03-14 IMAGING — MG MM DIGITAL DIAGNOSTIC UNILAT*L* W/ TOMO W/ CAD
4 series · 4 of 12 positions shown · non-contrast
Comparison: Previous exams including recent screening mammogram
dated 02/13/2022.

CLINICAL DATA: Patient returns today to evaluate a possible LEFT
breast mass questioned on recent screening mammogram.

EXAM:
DIGITAL DIAGNOSTIC UNILATERAL LEFT MAMMOGRAM WITH TOMOSYNTHESIS AND
CAD; ULTRASOUND LEFT BREAST LIMITED
TECHNIQUE: Left digital diagnostic mammography and breast tomosynthesis was
performed. The images were evaluated with computer-aided detection.;
Targeted ultrasound examination of the left breast was performed.

[L ML synth-2D]
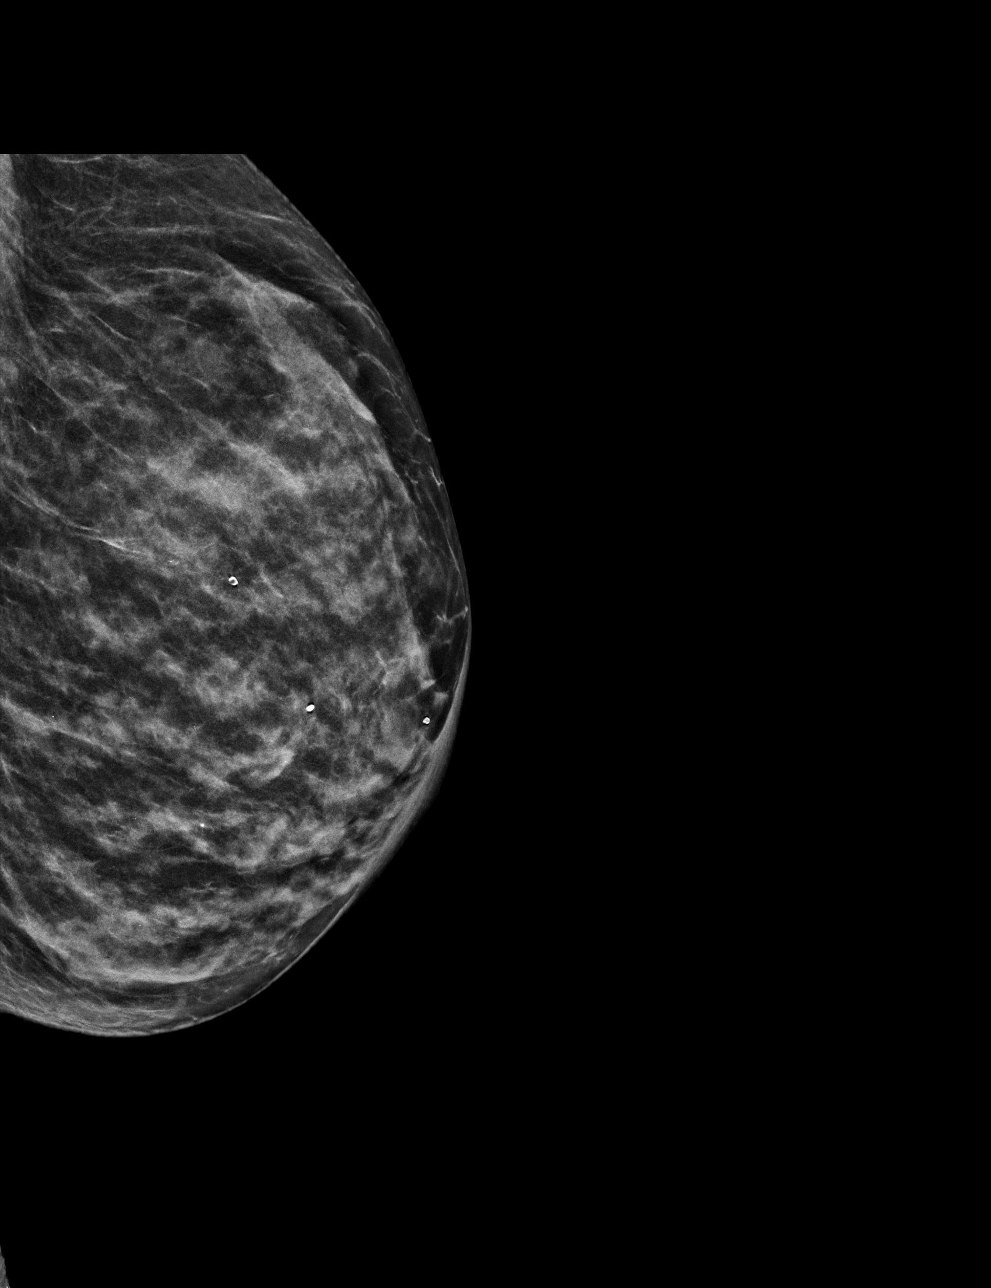

[L MLO synth-2D]
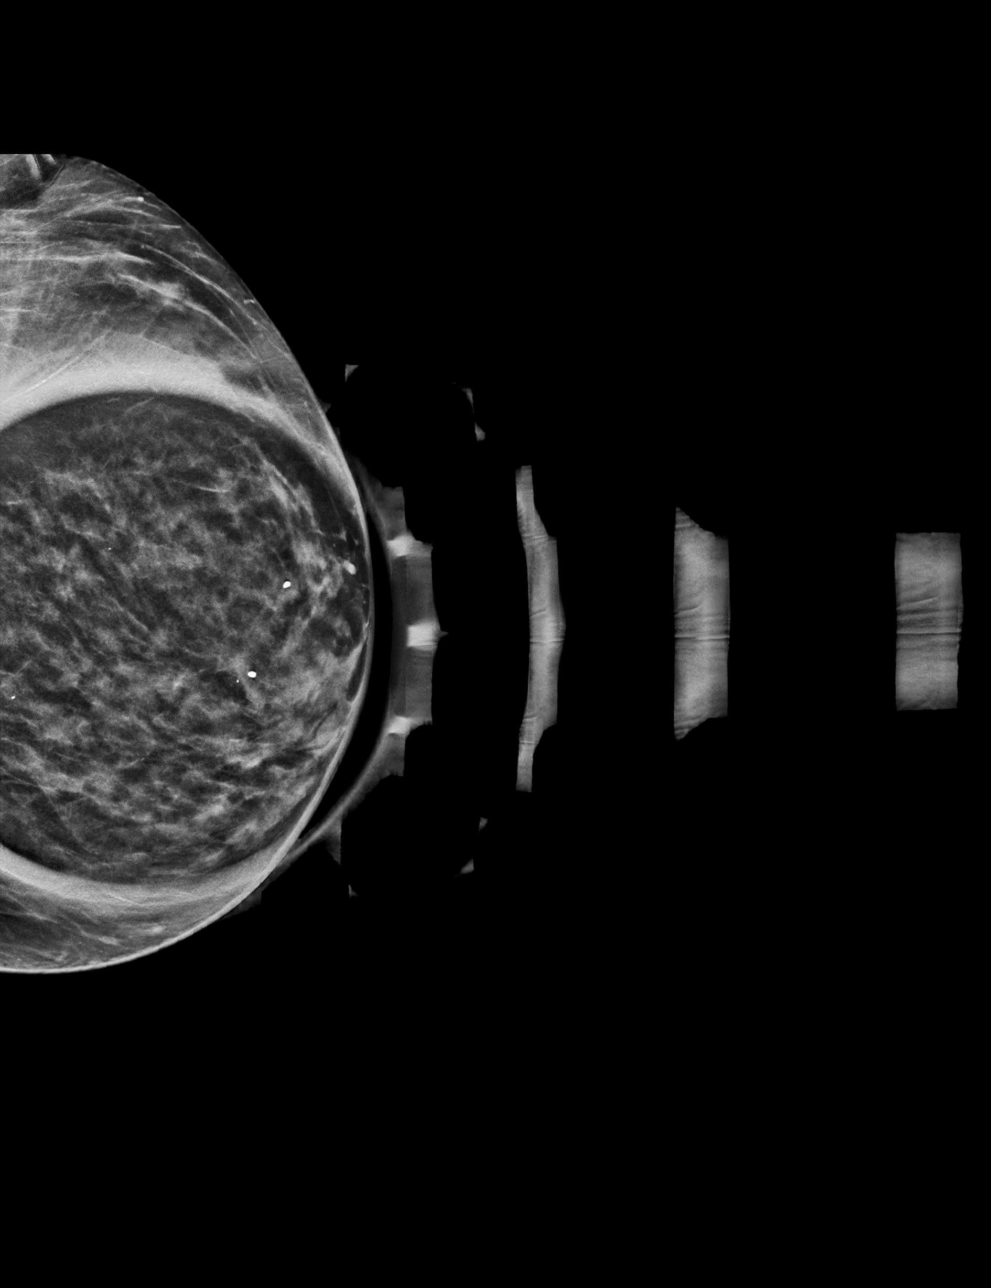

[L MLO tomo · tomo slice 23/45.0]
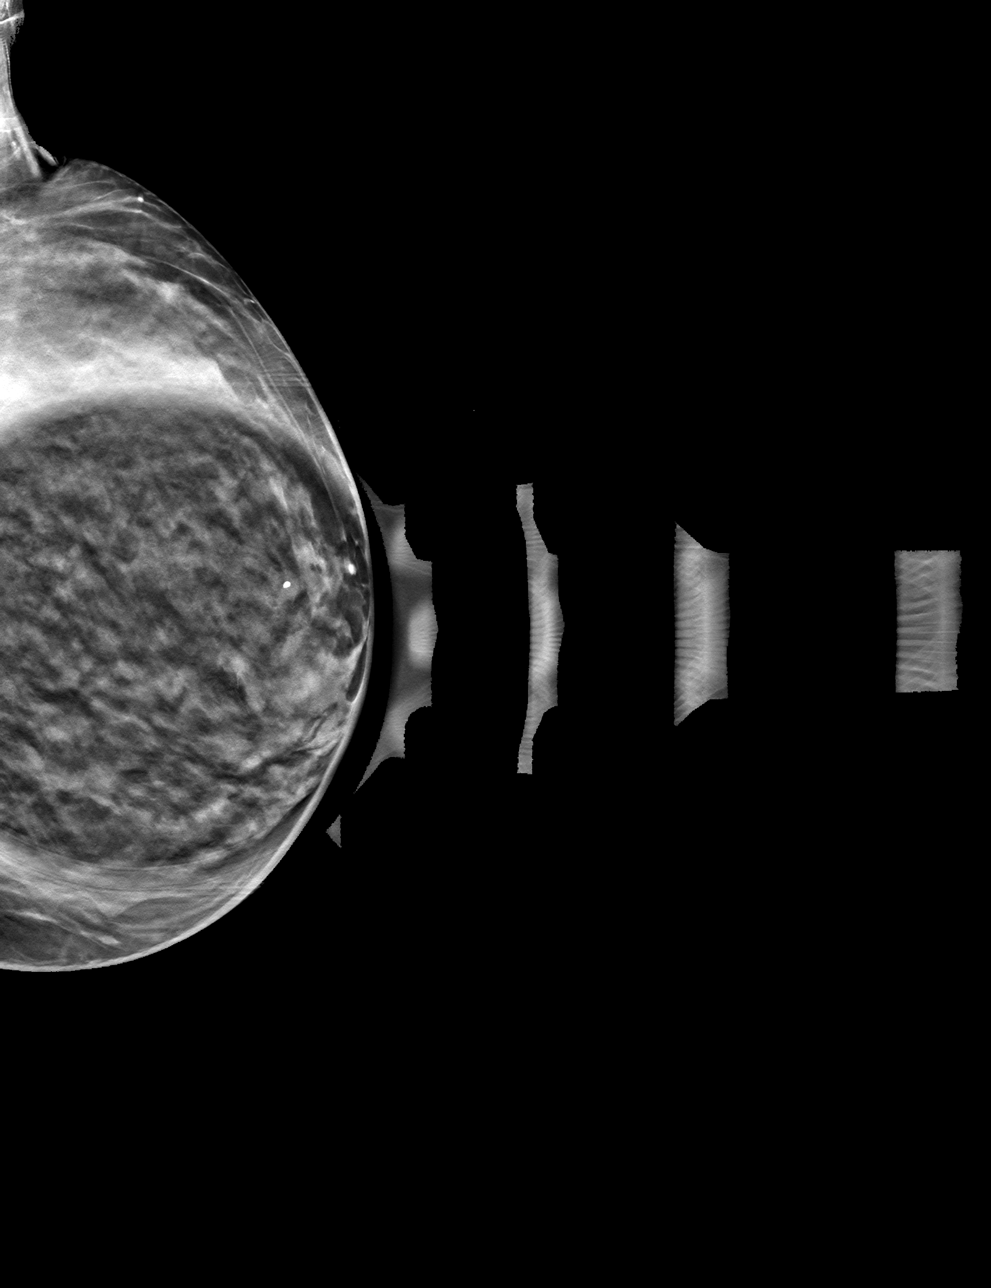

[L ML tomo · tomo slice 23/45.0]
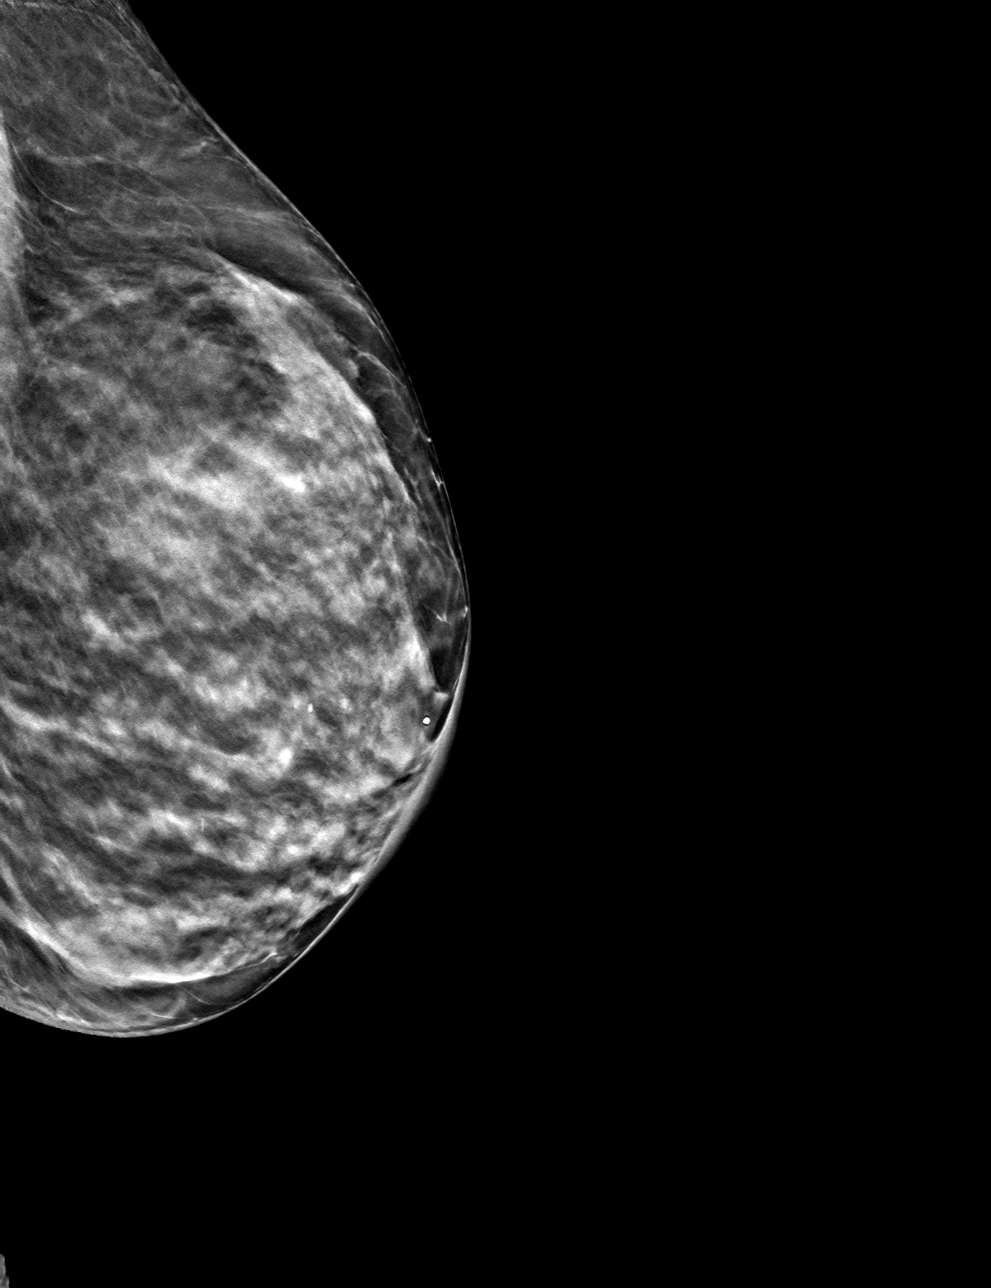

[4 of 12 positions shown; findings below may reference images not displayed]

ACR Breast Density Category d: The breast tissue is extremely dense,
which lowers the sensitivity of mammography.
FINDINGS: On today's additional diagnostic views, there is no persistent
abnormality confirmed within the subareolar LEFT breast. However,
given the dense tissues within the subareolar LEFT breast,
ultrasound will be performed to ensure benignity.

Targeted ultrasound is performed, evaluating the subareolar LEFT
breast as well as the upper and outer periareolar LEFT breast,
showing only normal fibroglandular tissues and fat lobules
throughout. No solid or cystic mass. No duct dilatation.
IMPRESSION: No evidence of malignancy.

Patient may return to routine annual bilateral screening mammogram
schedule.

RECOMMENDATION:
Screening mammogram in one year.(Code:[id number])

I have discussed the findings and recommendations with the patient.
If applicable, a reminder letter will be sent to the patient
regarding the next appointment.

BI-RADS CATEGORY  1: Negative.

## 2024-03-14 IMAGING — US US BREAST*L* LIMITED INC AXILLA
1 series · 8 of 8 positions shown · non-contrast
Comparison: Previous exams including recent screening mammogram
dated 02/13/2022.

CLINICAL DATA: Patient returns today to evaluate a possible LEFT
breast mass questioned on recent screening mammogram.

EXAM:
DIGITAL DIAGNOSTIC UNILATERAL LEFT MAMMOGRAM WITH TOMOSYNTHESIS AND
CAD; ULTRASOUND LEFT BREAST LIMITED
TECHNIQUE: Left digital diagnostic mammography and breast tomosynthesis was
performed. The images were evaluated with computer-aided detection.;
Targeted ultrasound examination of the left breast was performed.

[Series 1: us breast*left* limited inc axilla · 0.06mm/px · 8 of 8 slices shown]
[im 1/8]
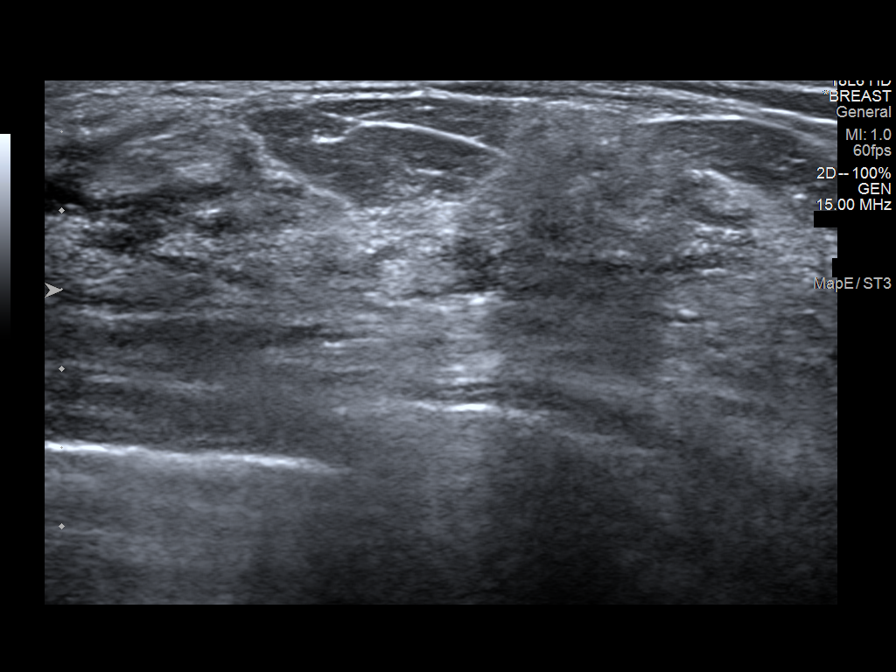
[im 2/8]
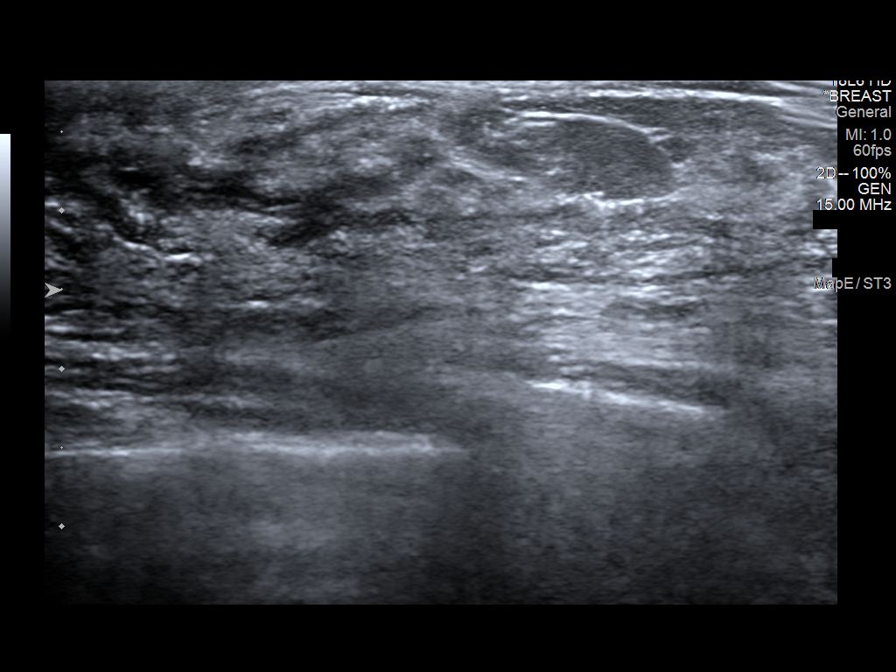
[im 3/8]
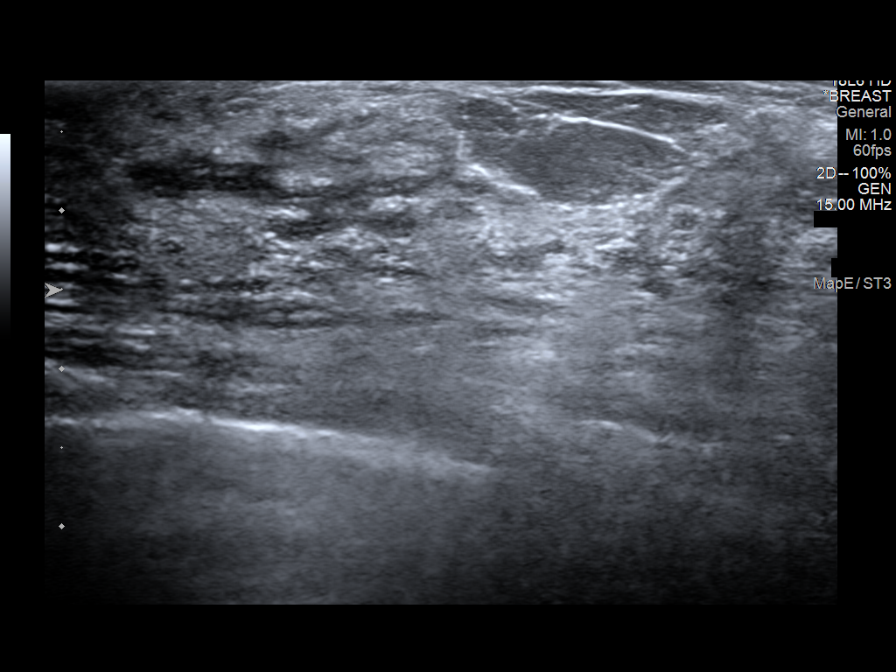
[im 4/8]
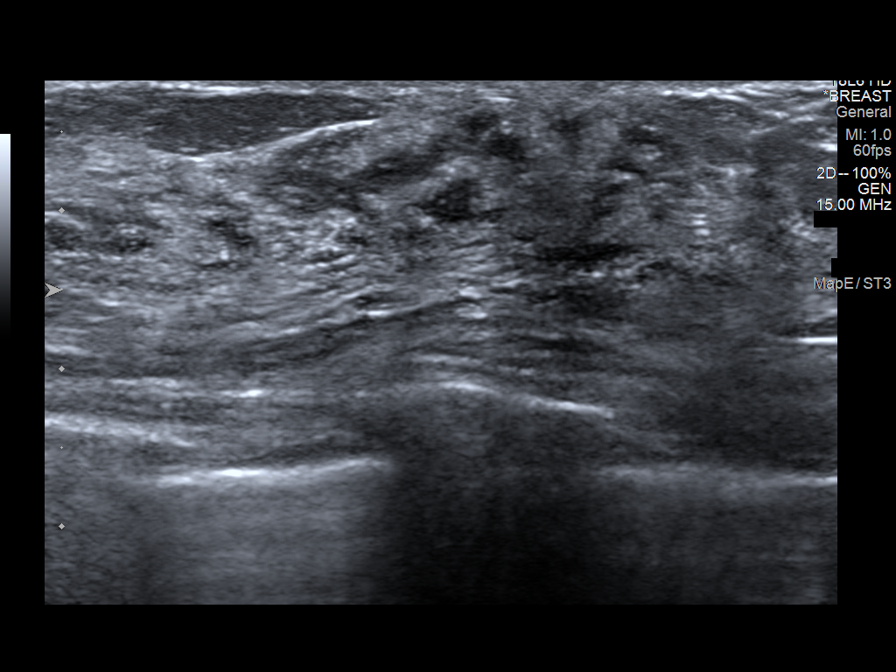
[im 5/8]
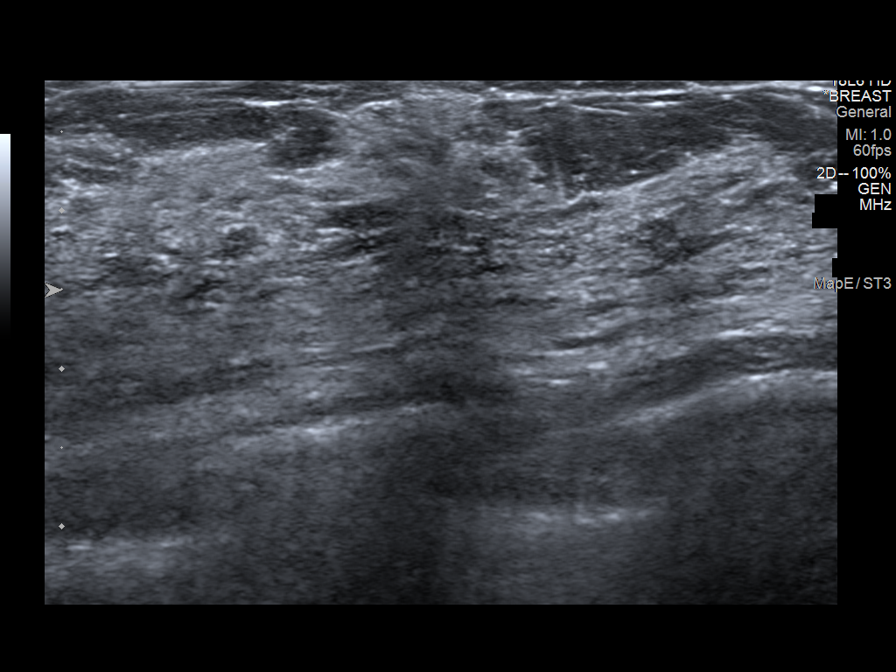
[im 6/8]
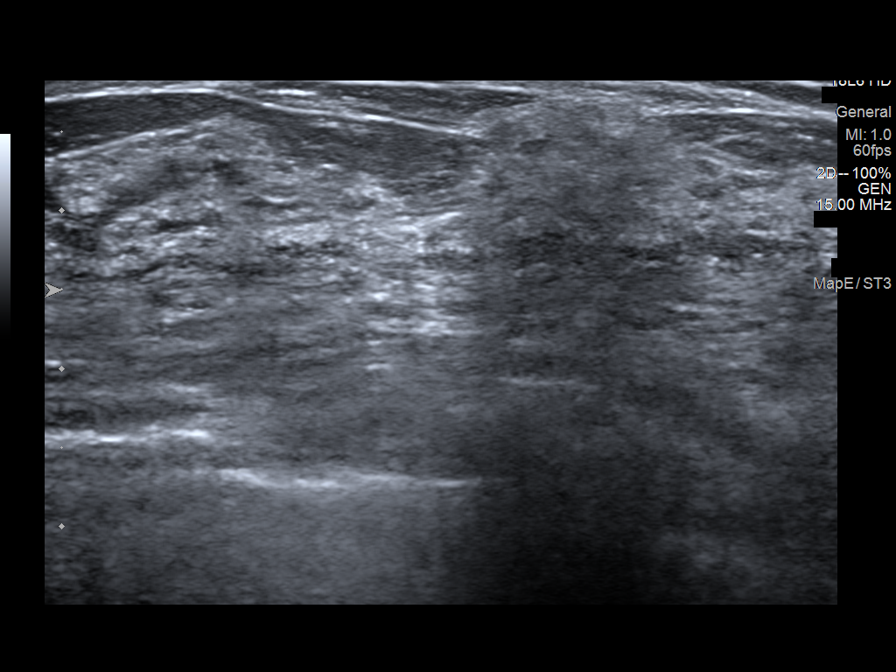
[im 7/8]
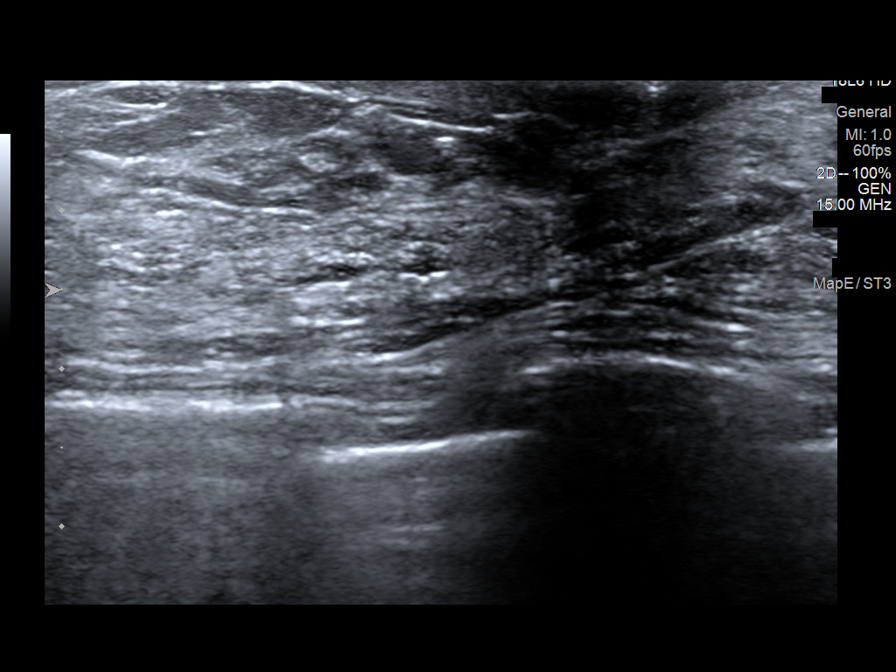
[im 8/8]
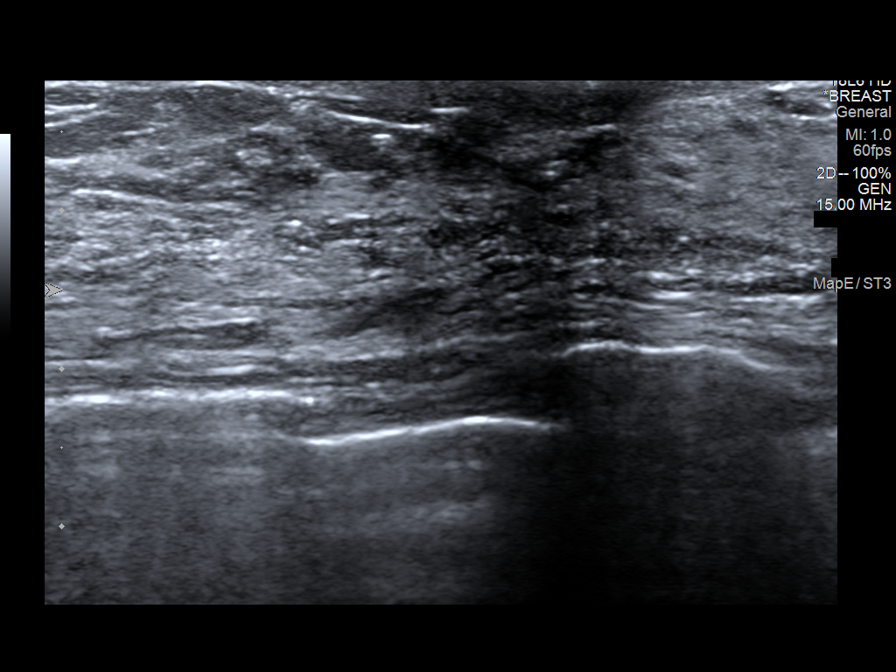

[8 of 8 positions shown; findings below may reference images not displayed]

ACR Breast Density Category d: The breast tissue is extremely dense,
which lowers the sensitivity of mammography.
FINDINGS: On today's additional diagnostic views, there is no persistent
abnormality confirmed within the subareolar LEFT breast. However,
given the dense tissues within the subareolar LEFT breast,
ultrasound will be performed to ensure benignity.

Targeted ultrasound is performed, evaluating the subareolar LEFT
breast as well as the upper and outer periareolar LEFT breast,
showing only normal fibroglandular tissues and fat lobules
throughout. No solid or cystic mass. No duct dilatation.
IMPRESSION: No evidence of malignancy.

Patient may return to routine annual bilateral screening mammogram
schedule.

RECOMMENDATION:
Screening mammogram in one year.(Code:[id number])

I have discussed the findings and recommendations with the patient.
If applicable, a reminder letter will be sent to the patient
regarding the next appointment.

BI-RADS CATEGORY  1: Negative.

## 2024-03-31 ENCOUNTER — Ambulatory Visit

## 2024-04-04 ENCOUNTER — Ambulatory Visit
Admission: RE | Admit: 2024-04-04 | Discharge: 2024-04-04 | Disposition: A | Discharge status: Home / Self Care | Source: Ambulatory Visit | Attending: Family Medicine | Admitting: Family Medicine

## 2024-04-04 DIAGNOSIS — Z1231 Encounter for screening mammogram for malignant neoplasm of breast: Secondary | ICD-10-CM | POA: Diagnosis not present

## 2024-04-06 ENCOUNTER — Other Ambulatory Visit: Payer: Self-pay | Admitting: Neurology

## 2024-04-06 DIAGNOSIS — G20A1 Parkinson's disease without dyskinesia, without mention of fluctuations: Secondary | ICD-10-CM

## 2024-04-08 ENCOUNTER — Ambulatory Visit: Payer: Medicare HMO

## 2024-04-08 VITALS — Ht 59.0 in | Wt 94.0 lb

## 2024-04-08 DIAGNOSIS — G20A1 Parkinson's disease without dyskinesia, without mention of fluctuations: Secondary | ICD-10-CM | POA: Diagnosis not present

## 2024-04-08 DIAGNOSIS — M5440 Lumbago with sciatica, unspecified side: Secondary | ICD-10-CM | POA: Diagnosis not present

## 2024-04-08 DIAGNOSIS — R030 Elevated blood-pressure reading, without diagnosis of hypertension: Secondary | ICD-10-CM | POA: Diagnosis not present

## 2024-04-08 DIAGNOSIS — Z008 Encounter for other general examination: Secondary | ICD-10-CM | POA: Diagnosis not present

## 2024-04-08 DIAGNOSIS — Z0000 Encounter for general adult medical examination without abnormal findings: Secondary | ICD-10-CM | POA: Diagnosis not present

## 2024-04-08 DIAGNOSIS — M1990 Unspecified osteoarthritis, unspecified site: Secondary | ICD-10-CM | POA: Diagnosis not present

## 2024-04-08 DIAGNOSIS — G709 Myoneural disorder, unspecified: Secondary | ICD-10-CM | POA: Diagnosis not present

## 2024-04-08 DIAGNOSIS — M810 Age-related osteoporosis without current pathological fracture: Secondary | ICD-10-CM | POA: Diagnosis not present

## 2024-04-08 DIAGNOSIS — E785 Hyperlipidemia, unspecified: Secondary | ICD-10-CM | POA: Diagnosis not present

## 2024-04-08 NOTE — Patient Instructions (Signed)
 Jennifer Lutz , Thank you for taking time out of your busy schedule to complete your Annual Wellness Visit with me. I enjoyed our conversation and look forward to speaking with you again next year. I, as well as your care team,  appreciate your ongoing commitment to your health goals. Please review the following plan we discussed and let me know if I can assist you in the future. Your Game plan/ To Do List    Referrals: If you haven't heard from the office you've been referred to, please reach out to them at the phone provided.   Follow up Visits: Next Medicare AWV with our clinical staff: 04/14/25 @ 8:40a   Have you seen your provider in the last 6 months (3 months if uncontrolled diabetes)?  Next Office Visit with your provider: 04/18/24 @ 1p  Clinician Recommendations:  Aim for 30 minutes of exercise or brisk walking, 6-8 glasses of water, and 5 servings of fruits and vegetables each day.       This is a list of the screening recommended for you and due dates:  Health Maintenance  Topic Date Due   COVID-19 Vaccine (4 - 2024-25 season) 05/24/2023   DTaP/Tdap/Td vaccine (4 - Tdap) 12/27/2023   Flu Shot  04/22/2024   Mammogram  04/04/2025   Medicare Annual Wellness Visit  04/08/2025   Colon Cancer Screening  10/29/2031   Pneumococcal Vaccine for age over 47  Completed   DEXA scan (bone density measurement)  Completed   Hepatitis C Screening  Completed   Zoster (Shingles) Vaccine  Completed   Hepatitis B Vaccine  Aged Out   HPV Vaccine  Aged Out   Meningitis B Vaccine  Aged Out    Advanced directives: (In Chart) A copy of your advanced directives are scanned into your chart should your provider ever need it. Advance Care Planning is important because it:  [x]  Makes sure you receive the medical care that is consistent with your values, goals, and preferences  [x]  It provides guidance to your family and loved ones and reduces their decisional burden about whether or not they are making  the right decisions based on your wishes.  Follow the link provided in your after visit summary or read over the paperwork we have mailed to you to help you started getting your Advance Directives in place. If you need assistance in completing these, please reach out to us  so that we can help you!  See attachments for Preventive Care and Fall Prevention Tips.

## 2024-04-08 NOTE — Progress Notes (Signed)
 Subjective:   Jennifer Lutz is a 73 y.o. who presents for a Medicare Wellness preventive visit.  As a reminder, Annual Wellness Visits don't include a physical exam, and some assessments may be limited, especially if this visit is performed virtually. We may recommend an in-person follow-up visit with your provider if needed.  Visit Complete: Virtual I connected with  Jennifer Lutz on 04/08/24 by a audio enabled telemedicine application and verified that I am speaking with the correct person using two identifiers.  Patient Location: Home  Provider Location: Home Office  I discussed the limitations of evaluation and management by telemedicine. The patient expressed understanding and agreed to proceed.  Vital Signs: Because this visit was a virtual/telehealth visit, some criteria may be missing or patient reported. Any vitals not documented were not able to be obtained and vitals that have been documented are patient reported.    Persons Participating in Visit: Patient.  AWV Questionnaire: No: Patient Medicare AWV questionnaire was not completed prior to this visit.  Cardiac Risk Factors include: advanced age (>27men, >55 women)     Objective:    Today's Vitals   04/08/24 0840  Weight: 94 lb (42.6 kg)  Height: 4' 11 (1.499 m)   Body mass index is 18.99 kg/m.     04/08/2024    8:46 AM 02/02/2024    8:17 AM 10/14/2023    2:48 PM 08/03/2023    8:33 AM 04/06/2023    9:01 AM 01/26/2023    8:08 AM 07/28/2022    8:08 AM  Advanced Directives  Does Patient Have a Medical Advance Directive? Yes Yes No Yes Yes Yes Yes  Type of Estate agent of [place];Living will Living will  Healthcare Power of [place];Living will;Out of facility DNR (pink MOST or yellow form) Healthcare Power of [place];Living will Living will Living will  Does patient want to make changes to medical advance directive? No - Patient declined    No - Patient declined    Copy of  Healthcare Power of Attorney in Chart? Yes - validated most recent copy scanned in chart (See row information)    Yes - validated most recent copy scanned in chart (See row information)    Would patient like information on creating a medical advance directive?   No - Patient declined        Current Medications (verified) Outpatient Encounter Medications as of 04/08/2024  Medication Sig   aspirin 81 MG tablet Take 81 mg by mouth daily.   carbidopa -levodopa  (SINEMET  IR) 25-100 MG tablet TAKE 1 TABLET BY MOUTH THREE TIMES DAILY (7AM,  11AM,  4PM)   LORazepam  (ATIVAN ) 0.5 MG tablet 1-2 tabs 30 - 60 min prior to MRI. Do not drive with this medicine.   magnesium 30 MG tablet Take 30 mg by mouth daily.   meloxicam  (MOBIC ) 15 MG tablet Take 1 tablet (15 mg total) by mouth daily.   Omega-3 Fatty Acids (OMEGA-3 FISH OIL) 500 MG CAPS Take 50 mg by mouth.   OVER THE COUNTER MEDICATION Calcium  with D3 500 mg   rosuvastatin  (CRESTOR ) 5 MG tablet TAKE 1 TABLET BY MOUTH THREE TIMES A WEEK   No facility-administered encounter medications on file as of 04/08/2024.    Allergies (verified) Patient has no known allergies.   History: Past Medical History:  Diagnosis Date   Chronic kidney disease    reduced kidney function   Complication of anesthesia    'takes very little.' 'Hard to wake up'  Degenerative disc disease, lumbar    Hyperlipidemia    no meds- diet controlled   Medical history non-contributory    Neuromuscular disorder (HCC)    Parkinson's disease   Past Surgical History:  Procedure Laterality Date   COLONOSCOPY  2008   FASCIAL DEFECT REPAIR Left 1983   left thigh   NASAL SEPTUM SURGERY  09/23/1983   WRIST ARTHROSCOPY WITH ULNA SHORTENING Left 06/07/2015   Procedure: ARTHROSCOPY LEFT WRIST DEBRIDEMENT ULNAR SHORTENING;  Surgeon: Arley Curia, MD;  Location: [facility];  Service: Orthopedics;  Laterality: Left;   WRIST OSTEOTOMY Left 06/07/2015   Procedure: LEFT  OSTEOTOMY;  Surgeon: Arley Curia, MD;  Location: [facility];  Service: Orthopedics;  Laterality: Left;   Family History  Problem Relation Age of Onset   Stroke Mother    Colon polyps Brother    Stroke Other    Colon cancer Neg Hx    Esophageal cancer Neg Hx    Stomach cancer Neg Hx    Rectal cancer Neg Hx    Social History   Socioeconomic History   Marital status: Divorced    Spouse name: Not on file   Number of children: Not on file   Years of education: Not on file   Highest education level: Not on file  Occupational History   Not on file  Tobacco Use   Smoking status: Never   Smokeless tobacco: Never  Vaping Use   Vaping status: Never Used  Substance and Sexual Activity   Alcohol use: Yes    Comment: 2 glasses qd   Drug use: No   Sexual activity: Not on file  Other Topics Concern   Not on file  Social History Narrative   Are you right handed or left handed? right   Are you currently employed ? No    What is your current occupation? NA   Do you live at home alone? alone   Who lives with you? NA   What type of home do you live in: 1 story or 2 story?  1 story no steps        Social Drivers of Corporate investment banker Strain: Low Risk  (04/08/2024)   Overall Financial Resource Strain (CARDIA)    Difficulty of Paying Living Expenses: Not hard at all  Food Insecurity: No Food Insecurity (04/08/2024)   Hunger Vital Sign    Worried About Running Out of Food in the Last Year: Never true    Ran Out of Food in the Last Year: Never true  Transportation Needs: No Transportation Needs (04/08/2024)   PRAPARE - Administrator, Civil Service (Medical): No    Lack of Transportation (Non-Medical): No  Physical Activity: Sufficiently Active (04/08/2024)   Exercise Vital Sign    Days of Exercise per Week: 3 days    Minutes of Exercise per Session: 90 min  Stress: No Stress Concern Present (04/08/2024)   [employer] of Occupational Health -  Occupational Stress Questionnaire    Feeling of Stress: Not at all  Social Connections: Moderately Integrated (04/08/2024)   Social Connection and Isolation Panel    Frequency of Communication with Friends and Family: More than three times a week    Frequency of Social Gatherings with Friends and Family: More than three times a week    Attends Religious Services: More than 4 times per year    Active Member of [employer] or Organizations: Yes    Attends [employer]  or Organization Meetings: More than 4 times per year    Marital Status: Divorced    Tobacco Counseling Counseling given: Not Answered    Clinical Intake:  Pre-visit preparation completed: Yes  Pain : No/denies pain     BMI - recorded: 18.99 Nutritional Status: BMI <19  Underweight Nutritional Risks: None Diabetes: No  Lab Results  Component Value Date   HGBA1C 5.4 04/03/2023   HGBA1C 5.5 02/20/2022   HGBA1C 5.5 10/31/2020     How often do you need to have someone help you when you read instructions, pamphlets, or other written materials from your doctor or pharmacy?: 1 - Never  Interpreter Needed?: No  Information entered by :: Rojelio Blush LPN   Activities of Daily Living      04/08/2024    8:45 AM  In your present state of health, do you have any difficulty performing the following activities:  Hearing? 0  Vision? 0  Difficulty concentrating or making decisions? 0  Walking or climbing stairs? 0  Dressing or bathing? 0  Doing errands, shopping? 0  Preparing Food and eating ? N  Using the Toilet? N  In the past six months, have you accidently leaked urine? N  Do you have problems with loss of bowel control? N  Managing your Medications? N  Managing your Finances? N  Housekeeping or managing your Housekeeping? N    Patient Care Team: Mercer Clotilda SAUNDERS, MD as PCP - General (Family Medicine) Tat, Asberry RAMAN, DO as Consulting Physician (Neurology) Livingston Rigg, MD (Inactive) as Consulting Physician  (Dermatology)   I have updated your Care Teams any recent Medical Services you may have received from other providers in the past year.     Assessment:   This is a routine wellness examination for Jennifer Lutz.  Hearing/Vision screen Hearing Screening - Comments:: Denies hearing difficulties   Vision Screening - Comments:: Wears rx glasses - up to date with routine eye exams with  Dr Glendia   Goals Addressed               This Visit's Progress     Remain active (pt-stated)         Depression Screen      04/08/2024    8:44 AM 04/03/2023    8:11 AM 01/19/2023    9:07 AM 04/01/2022    8:54 AM 02/20/2022    8:12 AM 03/29/2021   10:35 AM 10/31/2020    9:03 AM  PHQ 2/9 Scores  PHQ - 2 Score 0 0 0 0 0 0 0  PHQ- 9 Score  0 0 0 0      Fall Risk      04/08/2024    8:45 AM 02/02/2024    8:17 AM 08/03/2023    8:33 AM 04/06/2023    9:00 AM 04/03/2023    8:11 AM  Fall Risk   Falls in the past year? 0 0 0 1 1  Number falls in past yr: 0 0 0 0 0  Injury with Fall? 0 0 0 0 0  Risk for fall due to : No Fall Risks   No Fall Risks Other (Comment)  Follow up Falls evaluation completed  Falls evaluation completed Falls prevention discussed Falls evaluation completed    MEDICARE RISK AT HOME:   Medicare Risk at Home Any stairs in or around the home?: No If so, are there any without handrails?: No Home free of loose throw rugs in walkways, pet beds, electrical cords, etc?:  Yes Adequate lighting in your home to reduce risk of falls?: Yes Life alert?: No Use of a cane, walker or w/c?: No Grab bars in the bathroom?: Yes Shower chair or bench in shower?: No Elevated toilet seat or a handicapped toilet?: Yes  TIMED UP AND GO:  Was the test performed?  No  Cognitive Function: 6CIT completed    04/20/2018    1:47 PM 10/03/2016   11:47 AM  MMSE - Mini Mental State Exam  Not completed: -- --        04/08/2024    8:46 AM 04/06/2023    9:01 AM 04/01/2022    8:59 AM  6CIT Screen  What  Year? 0 points 0 points 0 points  What month? 0 points 0 points 0 points  What time? 0 points 0 points 0 points  Count back from 20 0 points 0 points 0 points  Months in reverse 0 points 0 points 0 points  Repeat phrase 0 points 0 points 0 points  Total Score 0 points 0 points 0 points    Immunizations Immunization History  Administered Date(s) Administered   Fluad Quad(high Dose 65+) 09/01/2019, 10/31/2020   Influenza Whole 07/08/2007, 10/16/2009   Influenza, High Dose Seasonal PF 07/29/2016, 09/07/2017   Moderna Sars-Covid-2 Vaccination 02/02/2020, 03/08/2020, 09/19/2020   PNEUMOCOCCAL CONJUGATE-20 04/07/2023   Pneumococcal Conjugate-13 01/30/2015   Pneumococcal Polysaccharide-23 12/26/2013   Td 09/22/1997, 09/29/2008   Tetanus 12/26/2013   Zoster Recombinant(Shingrix) 10/15/2019   Zoster, Live 03/16/2015   Zoster, Unspecified 01/11/2020    Screening Tests Health Maintenance  Topic Date Due   COVID-19 Vaccine (4 - 2024-25 season) 05/24/2023   DTaP/Tdap/Td (4 - Tdap) 12/27/2023   INFLUENZA VACCINE  04/22/2024   MAMMOGRAM  04/04/2025   Medicare Annual Wellness (AWV)  04/08/2025   Colonoscopy  10/29/2031   Pneumococcal Vaccine: 50+ Years  Completed   DEXA SCAN  Completed   Hepatitis C Screening  Completed   Zoster Vaccines- Shingrix  Completed   Hepatitis B Vaccines  Aged Out   HPV VACCINES  Aged Out   Meningococcal B Vaccine  Aged Out    Health Maintenance  Health Maintenance Due  Topic Date Due   COVID-19 Vaccine (4 - 2024-25 season) 05/24/2023   DTaP/Tdap/Td (4 - Tdap) 12/27/2023   Health Maintenance Items Addressed:   Additional Screening:  Vision Screening: Recommended annual ophthalmology exams for early detection of glaucoma and other disorders of the eye. Would you like a referral to an eye doctor? No    Dental Screening: Recommended annual dental exams for proper oral hygiene  Community Resource Referral / Chronic Care Management: CRR required  this visit?  No   CCM required this visit?  No   Plan:    I have personally reviewed and noted the following in the patient's chart:   Medical and social history Use of alcohol, tobacco or illicit drugs  Current medications and supplements including opioid prescriptions. Patient is not currently taking opioid prescriptions. Functional ability and status Nutritional status Physical activity Advanced directives List of other physicians Hospitalizations, surgeries, and ER visits in previous 12 months Vitals Screenings to include cognitive, depression, and falls Referrals and appointments  In addition, I have reviewed and discussed with patient certain preventive protocols, quality metrics, and best practice recommendations. A written personalized care plan for preventive services as well as general preventive health recommendations were provided to patient.   Rojelio LELON Blush, LPN   [id number]   After Visit Summary: (MyChart)  Due to this being a telephonic visit, the after visit summary with patients personalized plan was offered to patient via MyChart   Notes: Nothing significant to report at this time.

## 2024-04-18 ENCOUNTER — Encounter: Payer: Self-pay | Admitting: Family Medicine

## 2024-04-18 ENCOUNTER — Ambulatory Visit (INDEPENDENT_AMBULATORY_CARE_PROVIDER_SITE_OTHER): Admitting: Family Medicine

## 2024-04-18 VITALS — BP 140/76 | HR 70 | Temp 98.1°F | Ht 59.0 in | Wt 93.0 lb

## 2024-04-18 DIAGNOSIS — R42 Dizziness and giddiness: Secondary | ICD-10-CM

## 2024-04-18 DIAGNOSIS — E782 Mixed hyperlipidemia: Secondary | ICD-10-CM | POA: Diagnosis not present

## 2024-04-18 DIAGNOSIS — Z0000 Encounter for general adult medical examination without abnormal findings: Secondary | ICD-10-CM

## 2024-04-18 DIAGNOSIS — M810 Age-related osteoporosis without current pathological fracture: Secondary | ICD-10-CM | POA: Diagnosis not present

## 2024-04-18 DIAGNOSIS — Z0001 Encounter for general adult medical examination with abnormal findings: Secondary | ICD-10-CM

## 2024-04-18 DIAGNOSIS — R030 Elevated blood-pressure reading, without diagnosis of hypertension: Secondary | ICD-10-CM | POA: Diagnosis not present

## 2024-04-18 DIAGNOSIS — G20A1 Parkinson's disease without dyskinesia, without mention of fluctuations: Secondary | ICD-10-CM

## 2024-04-18 LAB — CBC WITH DIFFERENTIAL/PLATELET
Basophils Absolute: 0 K/uL (ref 0.0–0.1)
Basophils Relative: 0.4 % (ref 0.0–3.0)
Eosinophils Absolute: 0 K/uL (ref 0.0–0.7)
Eosinophils Relative: 1 % (ref 0.0–5.0)
HCT: 40 % (ref 36.0–46.0)
Hemoglobin: 13.3 g/dL (ref 12.0–15.0)
Lymphocytes Relative: 30 % (ref 12.0–46.0)
Lymphs Abs: 1.5 K/uL (ref 0.7–4.0)
MCHC: 33.2 g/dL (ref 30.0–36.0)
MCV: 99.7 fl (ref 78.0–100.0)
Monocytes Absolute: 0.3 K/uL (ref 0.1–1.0)
Monocytes Relative: 7 % (ref 3.0–12.0)
Neutro Abs: 3 K/uL (ref 1.4–7.7)
Neutrophils Relative %: 61.6 % (ref 43.0–77.0)
Platelets: 239 K/uL (ref 150.0–400.0)
RBC: 4.01 Mil/uL (ref 3.87–5.11)
RDW: 13.7 % (ref 11.5–15.5)
WBC: 4.9 K/uL (ref 4.0–10.5)

## 2024-04-18 LAB — COMPREHENSIVE METABOLIC PANEL WITH GFR
ALT: 3 U/L (ref 0–35)
AST: 18 U/L (ref 0–37)
Albumin: 4.6 g/dL (ref 3.5–5.2)
Alkaline Phosphatase: 51 U/L (ref 39–117)
BUN: 16 mg/dL (ref 6–23)
CO2: 31 meq/L (ref 19–32)
Calcium: 9.5 mg/dL (ref 8.4–10.5)
Chloride: 102 meq/L (ref 96–112)
Creatinine, Ser: 0.92 mg/dL (ref 0.40–1.20)
GFR: 60.93 mL/min (ref >60.00)
Glucose, Bld: 88 mg/dL (ref 70–99)
Potassium: 4.7 meq/L (ref 3.5–5.1)
Sodium: 141 meq/L (ref 135–145)
Total Bilirubin: 0.6 mg/dL (ref 0.2–1.2)
Total Protein: 7 g/dL (ref 6.0–8.3)

## 2024-04-18 LAB — LIPID PANEL
Cholesterol: 262 mg/dL — ABNORMAL HIGH (ref 0–200)
HDL: 112.1 mg/dL (ref >39.00)
LDL Cholesterol: 131 mg/dL — ABNORMAL HIGH (ref 0–99)
NonHDL: 149.58
Total CHOL/HDL Ratio: 2
Triglycerides: 92 mg/dL (ref 0.0–149.0)
VLDL: 18.4 mg/dL (ref 0.0–40.0)

## 2024-04-18 NOTE — Progress Notes (Signed)
 Established Patient Office Visit   Subjective  Patient ID: Jennifer Lutz, female    DOB: 1951-03-25  Age: 73 y.o. MRN: [id number]  Chief Complaint  Patient presents with   Annual Exam    Patient is a 73 year old female seen for CPE.  Patient states she has been doing well overall.  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  Feeling a little lightheaded when picking up golf ball from green or walking dog.  Tries to take her time when moving positions to help avoid this sensation.  Was unsure if increased heat was causing symptoms or if it was related to Parkinson's disease.  Taking Sinemet .  Patient taking rosuvastatin  5 mg 3 times weekly.  In the past had an fatigue with daily statin use.  Patient states had a home visit with wellness nurse from her insurance company.  Nurse inquired about pt being on medication for osteoporosis.  In the past patient was on medication but it caused pain in jaw.  Was told to ask about calcitonin.  Currently taking OTC vitamin D  and staying active.  Patient mentions having posterior right thigh pain which ended up being a large tear in her hamstrings.  In PT doing stretching exercises and dry needling.  Can occasionally feel increased tightness in muscles especially after prolonged sitting.  BP normal at home/when checked at local store.  When last checked at drugstore systolic was 110.    Patient Active Problem List   Diagnosis Date Noted   Low back pain 07/11/2020   Nonallopathic lesion of thoracic region 07/11/2020   Nonallopathic lesion of lumbar region 07/11/2020   Nonallopathic lesion of sacral region 07/11/2020   Parkinson's disease (HCC) 01/05/2020   Borderline high cholesterol 09/07/2017   Routine general medical examination at a health care facility 12/26/2013   OSTEOPOROSIS, IDIOPATHIC 07/08/2007   Past Medical History:  Diagnosis Date   Chronic kidney disease    reduced kidney function   Complication of anesthesia    'takes very  little.' 'Hard to wake up'   Degenerative disc disease, lumbar    Hyperlipidemia    no meds- diet controlled   Medical history non-contributory    Neuromuscular disorder (HCC)    Parkinson's disease   Past Surgical History:  Procedure Laterality Date   COLONOSCOPY  2008   FASCIAL DEFECT REPAIR Left 1983   left thigh   NASAL SEPTUM SURGERY  09/23/1983   WRIST ARTHROSCOPY WITH ULNA SHORTENING Left 06/07/2015   Procedure: ARTHROSCOPY LEFT WRIST DEBRIDEMENT ULNAR SHORTENING;  Surgeon: Arley Curia, MD;  Location: [facility];  Service: Orthopedics;  Laterality: Left;   WRIST OSTEOTOMY Left 06/07/2015   Procedure: LEFT OSTEOTOMY;  Surgeon: Arley Curia, MD;  Location: [facility];  Service: Orthopedics;  Laterality: Left;   Social History   Tobacco Use   Smoking status: Never   Smokeless tobacco: Never  Vaping Use   Vaping status: Never Used  Substance Use Topics   Alcohol use: Yes    Comment: 2 glasses qd   Drug use: No   Family History  Problem Relation Age of Onset   Stroke Mother    Colon polyps Brother    Stroke Other    Colon cancer Neg Hx    Esophageal cancer Neg Hx    Stomach cancer Neg Hx    Rectal cancer Neg Hx    No Known Allergies  ROS Negative unless stated above    Objective:     BP ROLLEN)  150/82 (BP Location: Left Arm, Patient Position: Sitting, Cuff Size: Small)   Pulse 70   Temp 98.1 F (36.7 C) (Oral)   Ht 4' 11 (1.499 m)   Wt 93 lb (42.2 kg)   SpO2 99%   BMI 18.78 kg/m  BP Readings from Last 3 Encounters:  04/18/24 (!) 150/82  02/02/24 (!) 142/80  11/17/23 108/78   Wt Readings from Last 3 Encounters:  04/18/24 93 lb (42.2 kg)  04/08/24 94 lb (42.6 kg)  02/02/24 94 lb 9.6 oz (42.9 kg)      Physical Exam Constitutional:      Appearance: Normal appearance.  HENT:     Head: Normocephalic and atraumatic.     Right Ear: Tympanic membrane, ear canal and external ear normal.     Left Ear: Tympanic membrane, ear  canal and external ear normal.     Nose: Nose normal.     Mouth/Throat:     Mouth: Mucous membranes are moist.     Pharynx: No oropharyngeal exudate or posterior oropharyngeal erythema.  Eyes:     General: No scleral icterus.    Extraocular Movements: Extraocular movements intact.     Conjunctiva/sclera: Conjunctivae normal.     Pupils: Pupils are equal, round, and reactive to light.  Neck:     Thyroid : No thyromegaly.  Cardiovascular:     Rate and Rhythm: Normal rate and regular rhythm.     Pulses: Normal pulses.     Heart sounds: Normal heart sounds. No murmur heard.    No friction rub.  Pulmonary:     Effort: Pulmonary effort is normal.     Breath sounds: Normal breath sounds. No wheezing, rhonchi or rales.  Abdominal:     General: Bowel sounds are normal.     Palpations: Abdomen is soft.     Tenderness: There is no abdominal tenderness.  Musculoskeletal:        General: No deformity. Normal range of motion.  Lymphadenopathy:     Cervical: No cervical adenopathy.  Skin:    General: Skin is warm and dry.     Findings: No lesion.  Neurological:     General: No focal deficit present.     Mental Status: She is alert and oriented to person, place, and time.  Psychiatric:        Mood and Affect: Mood normal.        Thought Content: Thought content normal.        04/18/2024    1:48 PM 04/08/2024    8:44 AM 04/03/2023    8:11 AM  Depression screen PHQ 2/9  Decreased Interest 0 0 0  Down, Depressed, Hopeless 0 0 0  PHQ - 2 Score 0 0 0  Altered sleeping 0  0  Tired, decreased energy 0  0  Change in appetite 0  0  Feeling bad or failure about yourself  0  0  Trouble concentrating 0  0  Moving slowly or fidgety/restless 0  0  Suicidal thoughts 0  0  PHQ-9 Score 0  0      04/18/2024    1:48 PM 04/03/2023    8:11 AM 01/19/2023    9:07 AM  GAD 7 : Generalized Anxiety Score  Nervous, Anxious, on Edge 0 0 0  Control/stop worrying 0 0 0  Worry too much - different things  0 0 0  Trouble relaxing 0 0 0  Restless 0 0 0  Easily annoyed or irritable 0 0 0  Afraid - awful might happen 0 0 0  Total GAD 7 Score 0 0 0     No results found for any visits on 04/18/24.    Assessment & Plan:   Well adult exam -     CBC with Differential/Platelet; Future -     Comprehensive metabolic panel with GFR; Future -     Lipid panel; Future -     T4, free; Future -     TSH; Future  White coat syndrome without diagnosis of hypertension -     CBC with Differential/Platelet; Future -     Comprehensive metabolic panel with GFR; Future -     T4, free; Future  Mixed hyperlipidemia -     Lipid panel; Future  Parkinson's disease without dyskinesia or fluctuating manifestations (HCC) -     Comprehensive metabolic panel with GFR; Future  Age-related osteoporosis without current pathological fracture -     VITAMIN D  25 Hydroxy (Vit-D Deficiency, Fractures); Future -     DG Bone Density; Future  Lightheadedness  Age-appropriate health screenings discussed.  Patient is not fasting.  Will obtain labs.  Immunizations reviewed and up-to-date.  Mammogram done 04/04/2024.  Colonoscopy done 10/28/2021.  Continue rosuvastatin  5 mg 3 times weekly.  Will make adjustments if needed based on lab results.  Continue to monitor BP.  No medications at at this time.  Discussed lightheadedness could be related to Parkinson's, medications, dehydration.  Given precautions.  Advised to follow-up with neurology.  Bone density scan ordered to reassess osteoporosis.  For worsening osteoporosis review medication options.  Return if symptoms worsen or fail to improve.   Jennifer JONELLE Single, MD

## 2024-04-19 LAB — VITAMIN D 25 HYDROXY (VIT D DEFICIENCY, FRACTURES): VITD: 65.79 ng/mL (ref 30.00–100.00)

## 2024-04-19 LAB — TSH: TSH: 1.93 u[IU]/mL (ref 0.35–5.50)

## 2024-04-19 LAB — T4, FREE: Free T4: 0.74 ng/dL (ref 0.60–1.60)

## 2024-04-29 ENCOUNTER — Ambulatory Visit: Payer: Self-pay | Admitting: Family Medicine

## 2024-05-03 ENCOUNTER — Telehealth: Payer: Self-pay | Admitting: Neurology

## 2024-05-03 NOTE — Telephone Encounter (Signed)
 Pt agreed to start keeping tract w/ her blood pressure position sitting and standing and keep us  updated through [facility].

## 2024-05-03 NOTE — Telephone Encounter (Signed)
 Pt called in stating she has been feeling lightheaded the last couple of weeks. Every now and then she will get a little wobbly.

## 2024-05-15 ENCOUNTER — Encounter: Payer: Self-pay | Admitting: Neurology

## 2024-06-14 DIAGNOSIS — X32XXXD Exposure to sunlight, subsequent encounter: Secondary | ICD-10-CM | POA: Diagnosis not present

## 2024-06-14 DIAGNOSIS — T148XXA Other injury of unspecified body region, initial encounter: Secondary | ICD-10-CM | POA: Diagnosis not present

## 2024-06-14 DIAGNOSIS — L570 Actinic keratosis: Secondary | ICD-10-CM | POA: Diagnosis not present

## 2024-06-14 DIAGNOSIS — D225 Melanocytic nevi of trunk: Secondary | ICD-10-CM | POA: Diagnosis not present

## 2024-07-02 ENCOUNTER — Other Ambulatory Visit: Payer: Self-pay | Admitting: Neurology

## 2024-07-02 DIAGNOSIS — G20A1 Parkinson's disease without dyskinesia, without mention of fluctuations: Secondary | ICD-10-CM

## 2024-08-03 NOTE — Progress Notes (Unsigned)
 Assessment/Plan:   1.  Parkinsons Disease  -Continue carbidopa /levodopa  25/100, 1 tablet 3 times per day.  She doesn't feel need to increase medication and I agree  -She is following with Dr. Niels Bless   2.  Hx of Sciatica/low back pain  -Following with Dr. Claudene and Dr. Leonce.  Treatment with [facility] in past   3.  Chronic right shoulder pain due to rotator cuff pathology  -Following with sports medicine, Dr. Leonce  -Has found injections beneficial  4.  Hip pain  -was working with Dr. Leonce, s/p PRP and PT with dry needling.  Felt that dry needling made biggest difference.  Is dealing with hamstring injury right now but feels that is getting better  5.  Mild dysphagia  -discussed mbe but she wants to hold for now. Subjective:   Jennifer Lutz was seen today in follow up for Parkinsons disease.  My previous records were reviewed prior to todays visit as well as outside records available to me.  Overall, she is doing fairly well.  She is faithful with her levodopa .  She emailed me in August stating that she had been a little bit lightheaded and I asked her to keep track of her blood pressure and she did and emailed me a few weeks later.  For the most part, the blood pressures look quite good, and by the time she was able to keep track of them and email me, she was feeling better.  She has some trouble with swallowing foods like rice and spaghetti.  She states that she doesn't want to over dramatize the issue but its definitely there.  She hasn't been able to exercise at gym x 3 weeks because of torn hamstring but she is walking vigorously with her dog in the park and she thinks that the hamstring is getting better.    Current prescribed movement disorder medications: Carbidopa /levodopa  25/100, 1 tablet 3 times per day   ALLERGIES:  No Known Allergies  CURRENT MEDICATIONS:  Outpatient Encounter Medications as of 08/05/2024  Medication Sig   aspirin 81 MG tablet Take  81 mg by mouth daily.   carbidopa -levodopa  (SINEMET  IR) 25-100 MG tablet TAKE 1 TABLET BY MOUTH THREE TIMES DAILY AT 7 AM, 11 AM AND 4 PM   magnesium 30 MG tablet Take 30 mg by mouth daily.   Omega-3 Fatty Acids (OMEGA-3 FISH OIL) 500 MG CAPS Take 50 mg by mouth.   OVER THE COUNTER MEDICATION Calcium  with D3 500 mg   rosuvastatin  (CRESTOR ) 5 MG tablet TAKE 1 TABLET BY MOUTH THREE TIMES A WEEK   saccharomyces boulardii (FLORASTOR) 250 MG capsule Take 250 mg by mouth 2 (two) times daily.   No facility-administered encounter medications on file as of 08/05/2024.    Objective:   PHYSICAL EXAMINATION:    VITALS:   Vitals:   08/05/24 0834  BP: 138/82  Pulse: 77  SpO2: 98%  Weight: 93 lb 6.4 oz (42.4 kg)   GEN:  The patient appears stated age and is in NAD.   HEENT:  Normocephalic, atraumatic.  The mucous membranes are moist.  CV:  RRR Lungs:  CTAB  Neurological examination:  Orientation: The patient is alert and oriented x3. Cranial nerves: There is good facial symmetry with facial hypomimia. The speech is fluent and clear. Soft palate rises symmetrically and there is no tongue deviation. Hearing is intact to conversational tone. Sensation: Sensation is intact to light touch throughout Motor: Strength is at least antigravity  x4.  Movement examination: Tone: There is very mild increased tone in the RUE/RLE with activation Abnormal movements: there is no tremor today Coordination:  There is no decremation, with any form of RAMS, including alternating supination and pronation of the forearm, hand opening and closing, finger taps, heel taps and toe taps. Gait and Station: The patient ambulates well in the hall with good arm swing.    I have reviewed and interpreted the following labs independently    Chemistry      Component Value Date/Time   NA 141 04/18/2024 1350   K 4.7 04/18/2024 1350   CL 102 04/18/2024 1350   CO2 31 04/18/2024 1350   BUN 16 04/18/2024 1350   CREATININE  0.92 04/18/2024 1350      Component Value Date/Time   CALCIUM  9.5 04/18/2024 1350   ALKPHOS 51 04/18/2024 1350   AST 18 04/18/2024 1350   ALT 3 04/18/2024 1350   BILITOT 0.6 04/18/2024 1350       Lab Results  Component Value Date   WBC 4.9 04/18/2024   HGB 13.3 04/18/2024   HCT 40.0 04/18/2024   MCV 99.7 04/18/2024   PLT 239.0 04/18/2024    Lab Results  Component Value Date   TSH 1.93 04/18/2024   Total time spent on today's visit was 30 minutes, including both face-to-face time and nonface-to-face time.  Time included that spent on review of records (prior notes available to me/labs/imaging if pertinent), discussing treatment and goals, answering patient's questions and coordinating care.  Cc:  Mercer Clotilda SAUNDERS, MD

## 2024-08-05 ENCOUNTER — Encounter: Payer: Self-pay | Admitting: Neurology

## 2024-08-05 ENCOUNTER — Ambulatory Visit: Admitting: Neurology

## 2024-08-05 VITALS — BP 138/82 | HR 77 | Wt 93.4 lb

## 2024-08-05 DIAGNOSIS — R1319 Other dysphagia: Secondary | ICD-10-CM | POA: Diagnosis not present

## 2024-08-05 DIAGNOSIS — G20A1 Parkinson's disease without dyskinesia, without mention of fluctuations: Secondary | ICD-10-CM | POA: Diagnosis not present

## 2024-08-05 NOTE — Patient Instructions (Signed)
 Good to see you today! You will think about modified barium swallow study and we will discuss more next visit.  The physicians and staff at [facility] are committed to providing excellent care. You may receive a survey requesting feedback about your experience at our office. We strive to receive very good responses to the survey questions. If you feel that your experience would prevent you from giving the office a very good  response, please contact our office to try to remedy the situation. We may be reached at [phone]. Thank you for taking the time out of your busy day to complete the survey.

## 2024-08-12 DIAGNOSIS — H52223 Regular astigmatism, bilateral: Secondary | ICD-10-CM | POA: Diagnosis not present

## 2024-09-27 DIAGNOSIS — G20A1 Parkinson's disease without dyskinesia, without mention of fluctuations: Secondary | ICD-10-CM

## 2024-09-30 ENCOUNTER — Encounter: Payer: Self-pay | Admitting: Neurology

## 2025-02-02 ENCOUNTER — Ambulatory Visit: Admitting: Neurology

## 2025-02-14 ENCOUNTER — Ambulatory Visit: Admitting: Neurology

## 2025-04-14 ENCOUNTER — Ambulatory Visit
